# Patient Record
Sex: Female | Born: 1955 | Race: Black or African American | Hispanic: No | Marital: Single | Smoking: Former smoker
Health system: Southern US, Community
[De-identification: ages and names within clinical notes are randomized; demographics above are authoritative.]

## PROBLEM LIST (undated history)

## (undated) DIAGNOSIS — K2970 Gastritis, unspecified, without bleeding: Secondary | ICD-10-CM

## (undated) DIAGNOSIS — E785 Hyperlipidemia, unspecified: Secondary | ICD-10-CM

## (undated) DIAGNOSIS — R05 Cough: Secondary | ICD-10-CM

## (undated) DIAGNOSIS — F32A Depression, unspecified: Secondary | ICD-10-CM

## (undated) DIAGNOSIS — I10 Essential (primary) hypertension: Secondary | ICD-10-CM

## (undated) DIAGNOSIS — B019 Varicella without complication: Secondary | ICD-10-CM

## (undated) DIAGNOSIS — R531 Weakness: Secondary | ICD-10-CM

## (undated) DIAGNOSIS — E119 Type 2 diabetes mellitus without complications: Secondary | ICD-10-CM

## (undated) DIAGNOSIS — F329 Major depressive disorder, single episode, unspecified: Secondary | ICD-10-CM

## (undated) DIAGNOSIS — B9681 Helicobacter pylori [H. pylori] as the cause of diseases classified elsewhere: Secondary | ICD-10-CM

## (undated) DIAGNOSIS — Z5189 Encounter for other specified aftercare: Secondary | ICD-10-CM

## (undated) DIAGNOSIS — Z9889 Other specified postprocedural states: Secondary | ICD-10-CM

## (undated) DIAGNOSIS — R059 Cough, unspecified: Secondary | ICD-10-CM

## (undated) DIAGNOSIS — I639 Cerebral infarction, unspecified: Secondary | ICD-10-CM

## (undated) HISTORY — DX: Cough: R05

## (undated) HISTORY — DX: Helicobacter pylori (H. pylori) as the cause of diseases classified elsewhere: B96.81

## (undated) HISTORY — DX: Gastritis, unspecified, without bleeding: K29.70

## (undated) HISTORY — PX: FEMUR SURGERY: SHX943

## (undated) HISTORY — DX: Major depressive disorder, single episode, unspecified: F32.9

## (undated) HISTORY — DX: Varicella without complication: B01.9

## (undated) HISTORY — DX: Cerebral infarction, unspecified: I63.9

## (undated) HISTORY — DX: Essential (primary) hypertension: I10

## (undated) HISTORY — DX: Encounter for other specified aftercare: Z51.89

## (undated) HISTORY — PX: UTERINE FIBROID SURGERY: SHX826

## (undated) HISTORY — PX: MOUTH SURGERY: SHX715

## (undated) HISTORY — DX: Depression, unspecified: F32.A

## (undated) HISTORY — DX: Weakness: R53.1

## (undated) HISTORY — DX: Cough, unspecified: R05.9

## (undated) HISTORY — DX: Type 2 diabetes mellitus without complications: E11.9

## (undated) HISTORY — DX: Hyperlipidemia, unspecified: E78.5

---

## 2015-03-22 DIAGNOSIS — I6350 Cerebral infarction due to unspecified occlusion or stenosis of unspecified cerebral artery: Secondary | ICD-10-CM | POA: Insufficient documentation

## 2015-04-03 DIAGNOSIS — R7303 Prediabetes: Secondary | ICD-10-CM | POA: Insufficient documentation

## 2015-04-21 DIAGNOSIS — R1900 Intra-abdominal and pelvic swelling, mass and lump, unspecified site: Secondary | ICD-10-CM | POA: Insufficient documentation

## 2015-04-21 DIAGNOSIS — I10 Essential (primary) hypertension: Secondary | ICD-10-CM | POA: Insufficient documentation

## 2015-09-09 HISTORY — PX: LOOP RECORDER IMPLANT: SHX5954

## 2015-09-09 LAB — CBC AND DIFFERENTIAL
HEMATOCRIT: 42 % (ref 36–46)
Hemoglobin: 14.1 g/dL (ref 12.0–16.0)
PLATELETS: 154 10*3/uL (ref 150–399)
WBC: 7.5 10*3/mL

## 2015-09-09 LAB — BASIC METABOLIC PANEL
CREATININE: 0.8 mg/dL (ref 0.5–1.1)
Glucose: 85 mg/dL
POTASSIUM: 4.2 mmol/L (ref 3.4–5.3)
Potassium: 4.2 mmol/L (ref 3.4–5.3)
Sodium: 141 mmol/L (ref 137–147)

## 2016-02-04 ENCOUNTER — Ambulatory Visit (INDEPENDENT_AMBULATORY_CARE_PROVIDER_SITE_OTHER): Payer: PRIVATE HEALTH INSURANCE | Admitting: Family Medicine

## 2016-02-04 ENCOUNTER — Encounter: Payer: Self-pay | Admitting: Family Medicine

## 2016-02-04 VITALS — BP 116/74 | HR 77 | Temp 97.8°F | Ht 64.0 in | Wt 154.2 lb

## 2016-02-04 DIAGNOSIS — E785 Hyperlipidemia, unspecified: Secondary | ICD-10-CM

## 2016-02-04 DIAGNOSIS — Z8673 Personal history of transient ischemic attack (TIA), and cerebral infarction without residual deficits: Secondary | ICD-10-CM

## 2016-02-04 DIAGNOSIS — R569 Unspecified convulsions: Secondary | ICD-10-CM

## 2016-02-04 DIAGNOSIS — R531 Weakness: Secondary | ICD-10-CM

## 2016-02-04 DIAGNOSIS — Z1329 Encounter for screening for other suspected endocrine disorder: Secondary | ICD-10-CM

## 2016-02-04 DIAGNOSIS — I10 Essential (primary) hypertension: Secondary | ICD-10-CM

## 2016-02-04 DIAGNOSIS — Z130 Encounter for screening for diseases of the blood and blood-forming organs and certain disorders involving the immune mechanism: Secondary | ICD-10-CM

## 2016-02-04 DIAGNOSIS — E119 Type 2 diabetes mellitus without complications: Secondary | ICD-10-CM | POA: Diagnosis not present

## 2016-02-04 DIAGNOSIS — Z5181 Encounter for therapeutic drug level monitoring: Secondary | ICD-10-CM

## 2016-02-04 DIAGNOSIS — G8112 Spastic hemiplegia affecting left dominant side: Secondary | ICD-10-CM | POA: Insufficient documentation

## 2016-02-04 LAB — LIPID PANEL
Cholesterol: 153 mg/dL (ref 0–200)
HDL: 69.9 mg/dL (ref >39.00)
LDL Cholesterol: 72 mg/dL (ref 0–99)
NONHDL: 83.3
TRIGLYCERIDES: 58 mg/dL (ref 0.0–149.0)
Total CHOL/HDL Ratio: 2
VLDL: 11.6 mg/dL (ref 0.0–40.0)

## 2016-02-04 LAB — COMPREHENSIVE METABOLIC PANEL
ALK PHOS: 130 U/L — AB (ref 39–117)
ALT: 53 U/L — AB (ref 0–35)
AST: 39 U/L — ABNORMAL HIGH (ref 0–37)
Albumin: 4.2 g/dL (ref 3.5–5.2)
BILIRUBIN TOTAL: 0.3 mg/dL (ref 0.2–1.2)
BUN: 12 mg/dL (ref 6–23)
CALCIUM: 9.3 mg/dL (ref 8.4–10.5)
CO2: 30 meq/L (ref 19–32)
Chloride: 103 mEq/L (ref 96–112)
Creatinine, Ser: 0.81 mg/dL (ref 0.40–1.20)
GFR: 91.99 mL/min (ref >60.00)
GLUCOSE: 97 mg/dL (ref 70–99)
POTASSIUM: 3.9 meq/L (ref 3.5–5.1)
Sodium: 139 mEq/L (ref 135–145)
TOTAL PROTEIN: 7.2 g/dL (ref 6.0–8.3)

## 2016-02-04 LAB — CBC
HEMATOCRIT: 38.4 % (ref 36.0–46.0)
Hemoglobin: 13.1 g/dL (ref 12.0–15.0)
MCHC: 34 g/dL (ref 30.0–36.0)
MCV: 79.6 fl (ref 78.0–100.0)
PLATELETS: 294 10*3/uL (ref 150.0–400.0)
RBC: 4.82 Mil/uL (ref 3.87–5.11)
RDW: 13.2 % (ref 11.5–15.5)
WBC: 8.6 10*3/uL (ref 4.0–10.5)

## 2016-02-04 LAB — TSH: TSH: 0.71 u[IU]/mL (ref 0.35–4.50)

## 2016-02-04 LAB — HEMOGLOBIN A1C: Hgb A1c MFr Bld: 5.8 % (ref 4.6–6.5)

## 2016-02-04 NOTE — Patient Instructions (Signed)
It was very nice to meet you today- I am going to work on arranging for home PT and OT, and hopefully a home aid for you as well.  We need to work on your left hand to prevent contractures and also help you learn to use your right hand for writing, etc We will get labs today to check on your diabetes  Please come and see me in 6-8 weeks so we can check on your progress.  I will also get you referred to a neurologist locally We will be in touch with your labs asap

## 2016-02-04 NOTE — Progress Notes (Signed)
Pre visit review using our clinic review tool, if applicable. No additional management support is needed unless otherwise documented below in the visit note. 

## 2016-02-04 NOTE — Progress Notes (Signed)
Wendy [name] at [facility] [address], [place], [place] [address] [phone]  Date:  02/04/2016   Name:  Wendy [name]   DOB:  31-Dec-1955   MRN:  [id number]  PCP:  Lamar Blinks, MD    Chief Complaint: Establish Care (Pt here to est care. Flu vaccine done 01/15/2016. )   History of Present Illness:  Wendy [name] is a 61 y.o. very pleasant female patient who presents with the following:  Here today as a new patient- no notes in Epic.  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  This has caused her some residual left sided weakness- she is left hand dominate so this has really effected her.   Per her report, the CVA was thought to be possibly due to a fib which triggered a blood clot. She had a loop recorder but states that it was never really determined if she had a fib or not. Otherwise they were not able to determine a cause of her a fib  She notes that she has weakness in her left arm which causes it to be hard for her to write and to do other tasks.   She tries to use her right hand but this is difficult   Prior to her stroke she was in pretty good health.    She does not know her current medications- we are calling her pharmacy for her  She has had a cough for "well over a year."  Seemed to start with bronchitis but has persisted.   No fever She is a former smoker and quit in 2013 She is living with her cousin right now States that she is able to drive but does not drive at this time.  She is able to bathe and dress but needs help making meals and doing more complex tasks.  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   She misses working and wishes she could work again She has applied for disability- she has already been  approved for social security disability per her report.    However she does not have medicare?   Also history of hyperlipidemia She is on keppra to prevent seizures since her CVA Also on HTN medication She has been feeling depressed since she had the CVA.  Discussed meds for same- she does not really want to be on meds right now.  She feels like she has good days and bad days.  Denies any SI.  She will let me know if her mood sx get worse  She did PT until her coverage ran out. She would like to restart PT and OT as she notes a contracture forming in her left hand   There are no active problems to display for this patient.   Past Medical History:  Diagnosis Date  . Hypertension   . Stroke [facility])     No past surgical history on file.  Social History  Substance Use Topics  . Smoking status: Former Smoker    Types: Cigarettes    Quit date: 01/12/2011  . Smokeless tobacco: Never Used  . Alcohol use No    No family history on file.  Allergies  Allergen Reactions  . Lisinopril Cough  .  Vicodin [Hydrocodone-Acetaminophen] Palpitations    Medication list has been reviewed and updated.  No current outpatient prescriptions on file prior to visit.   No current facility-administered medications on file prior to visit.     Review of Systems:  As per HPI- otherwise negative.   Physical Examination: Vitals:   02/04/16 0958  BP: 116/74  Pulse: 77  Temp: 97.8 F (36.6 C)   Vitals:   02/04/16 0958  Weight: 154 lb 3.2 oz (69.9 kg)  Height: 5\' 4"  (1.626 m)   Body mass index is 26.47 kg/m. Ideal Body Weight: Weight in (lb) to have BMI = 25: 145.3  GEN: WDWN, NAD, Non-toxic, A & O x 3, mild overweight, looks well, quiet affect HEENT: Atraumatic, Normocephalic. Neck supple. No masses, No LAD. Ears and Nose: No external deformity. CV: RRR, No M/G/R. No JVD. No thrill. No extra heart sounds. PULM: CTA B, no wheezes, crackles, rhonchi. No retractions. No resp. distress. No  accessory muscle use. EXTR: No c/c/e NEURO Normal gait.   She does carry a cane but does not seem to use it.   PSYCH: Normally interactive. Conversant. Not depressed or anxious appearing.  Calm demeanor.  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  Weak grip in the left hand, mild weakness of biceps and deltoid.    Assessment and Plan: History of CVA (cerebrovascular accident) - Plan: Ambulatory referral to [place], Ambulatory referral to Neurology  Controlled type 2 diabetes mellitus without complication, without long-term current use of insulin ([place]) - Plan: Comprehensive metabolic panel, Hemoglobin A1c  Hyperlipidemia, unspecified hyperlipidemia type - Plan: Lipid panel  Essential hypertension  Left-sided weakness - Plan: Ambulatory referral to [place], Ambulatory referral to Neurology  Seizures ([place]) - Plan: Ambulatory referral to [facility], Ambulatory referral to Neurology  Screening for thyroid disorder - Plan: TSH  Screening for deficiency anemia - Plan: CBC  Medication monitoring encounter - Plan: CBC, Comprehensive metabolic panel   Here today as a new patient.  History of DM, hyperlipidemia and HTN unfortunately she suffered a CVA last year and has resultant left sided weakness.  She is disabled due to this illness.  She would benefit from PT and OT to help prevent worsening and hopefully improve use of her left hand- will make this referral. Will also get her referred to a neurologist locally   It was very nice to meet you today- I am going to work on arranging for home PT and OT, and hopefully a home aid for you as well.  We need to work on your left hand to prevent contractures and also help you learn to use your right hand for writing, etc We will get labs today to check on your diabetes  Please come and see me in 6-8 weeks so we can check on your progress.  I will also  get you referred to a neurologist locally We will be in touch with your labs asap  Signed Lamar Blinks, MD

## 2016-02-06 ENCOUNTER — Encounter: Payer: Self-pay | Admitting: Family Medicine

## 2016-02-17 ENCOUNTER — Telehealth: Payer: Self-pay | Admitting: Family Medicine

## 2016-02-17 NOTE — Telephone Encounter (Signed)
Received her records from [facility] system As of July 2017  AST 28 ALT21 Alk phos 82 Total bili 0.4

## 2016-02-23 ENCOUNTER — Encounter: Payer: Self-pay | Admitting: Family Medicine

## 2016-03-04 ENCOUNTER — Ambulatory Visit: Payer: Self-pay | Admitting: Neurology

## 2016-03-04 ENCOUNTER — Telehealth: Payer: Self-pay | Admitting: Neurology

## 2016-03-04 NOTE — Telephone Encounter (Signed)
This patient no showed for a new patient appointment today.

## 2016-03-23 ENCOUNTER — Ambulatory Visit: Payer: Self-pay | Admitting: Neurology

## 2016-03-23 ENCOUNTER — Telehealth: Payer: Self-pay | Admitting: Neurology

## 2016-03-23 NOTE — Telephone Encounter (Signed)
This patient did not show for a new patient appointment today, this is the second no-show.  The patient will be discharged from practice.

## 2016-03-24 ENCOUNTER — Ambulatory Visit: Payer: PRIVATE HEALTH INSURANCE | Admitting: Family Medicine

## 2016-03-24 ENCOUNTER — Encounter: Payer: Self-pay | Admitting: Neurology

## 2016-04-12 ENCOUNTER — Telehealth: Payer: Self-pay | Admitting: Family Medicine

## 2016-04-12 MED ORDER — AMLODIPINE BESYLATE 10 MG PO TABS: 10.0000 mg | ORAL_TABLET | Freq: Every day | ORAL | 3 refills | Status: DC

## 2016-04-12 MED ORDER — LEVETIRACETAM 1000 MG PO TABS: 1000.0000 mg | ORAL_TABLET | Freq: Two times a day (BID) | ORAL | 3 refills | Status: DC

## 2016-04-12 NOTE — Addendum Note (Signed)
Addended by: Lamar Blinks C on: 04/12/2016 04:50 PM   Modules accepted: Orders

## 2016-04-12 NOTE — Telephone Encounter (Signed)
Called her and LMOM- I refilled keppra and amlodipine. I would suspect the lisinopril is what is causing her cough. Her BP was well controlled at last visit- if she likes she can hold the lisionpril for a couple of weeks and see if the cough goes away.  Please see me in the next couple of months to check on how she is doing

## 2016-04-12 NOTE — Telephone Encounter (Signed)
Pt states she is out of Kepra and Amlodipine. Pt states Amlodipine creates a dry cough and it is aggravating. Pt would like to try a different BP med. Pt ph# [phone]. Pt pharmacy [facility] in [place]

## 2016-04-12 NOTE — Telephone Encounter (Addendum)
Patient checking on the status of BP medication, patient states its okay if generic due to the cost, patient is completley out.

## 2016-04-13 MED ORDER — LOSARTAN POTASSIUM 50 MG PO TABS: 50.0000 mg | ORAL_TABLET | Freq: Every day | ORAL | 5 refills | Status: DC

## 2016-04-13 MED ORDER — AMLODIPINE BESYLATE 10 MG PO TABS: 10.0000 mg | ORAL_TABLET | Freq: Every day | ORAL | 3 refills | Status: DC

## 2016-04-13 MED ORDER — LEVETIRACETAM 1000 MG PO TABS: 1000.0000 mg | ORAL_TABLET | Freq: Two times a day (BID) | ORAL | 3 refills | Status: DC

## 2016-04-13 NOTE — Telephone Encounter (Addendum)
Medications resent to [facility] pharmacy as requested.  Called to cancel previous rx at [facility], pharmacist stated she had already transferred the prescription to [facility].  Called [facility] and canceled duplicate rx.

## 2016-04-13 NOTE — Telephone Encounter (Signed)
Unable to afford meds, patient does not have insurance card to show pharmacy and had spoken to insurance and are unable to provide insurance ID until all paper work is received. Request to speak with someone, call back # [phone]

## 2016-04-13 NOTE — Telephone Encounter (Signed)
Patient indicated please send Rx to  [facility], [place], [place] [address] [phone], patient would like [facility], patient currently at pharmacy.

## 2016-04-13 NOTE — Addendum Note (Signed)
Addended by: Lamar Blinks C on: 04/13/2016 08:08 PM   Modules accepted: Orders

## 2016-04-13 NOTE — Telephone Encounter (Addendum)
Relation to pt: self  Call back number: [phone]  Pharmacy: [facility], [place] [phone] (Phone) [phone] (Fax)     Reason for call:  Patient states amLODipine (NORVASC) 10 MG tablet causing her to cough at night, patient would like to speak with the MD directly, informed patient PCP is out of the office and will follow up. Informed patient to keep her phone near, patient voice understanding

## 2016-04-13 NOTE — Telephone Encounter (Signed)
Called pt back- she is no longer using lisinopril, I removed this from her med list. She notes that she has been on amlodipine for a year, and has noted a cough for a year.  We can certainly have her stop this med and try something different- will try losartan for her She will continue her HCTZ Allergy listed to lisinopril - cough only rx for losartan 50 mg.  She will let me know how this works for her She needs an OV- will ask staff to schedule her

## 2016-04-13 NOTE — Addendum Note (Signed)
Addended by: Dorrene German on: 04/13/2016 03:18 PM   Modules accepted: Orders

## 2016-04-14 NOTE — Telephone Encounter (Signed)
Pt schedule for 5/7 for BP f/u visit.

## 2016-04-30 ENCOUNTER — Encounter (HOSPITAL_COMMUNITY): Payer: Self-pay | Admitting: Oncology

## 2016-04-30 ENCOUNTER — Emergency Department (HOSPITAL_COMMUNITY)
Admission: EM | Admit: 2016-04-30 | Discharge: 2016-04-30 | Disposition: A | Discharge status: Home / Self Care | Payer: No Typology Code available for payment source | Attending: Emergency Medicine | Admitting: Emergency Medicine

## 2016-04-30 ENCOUNTER — Emergency Department (HOSPITAL_COMMUNITY): Payer: No Typology Code available for payment source

## 2016-04-30 DIAGNOSIS — Y999 Unspecified external cause status: Secondary | ICD-10-CM | POA: Insufficient documentation

## 2016-04-30 DIAGNOSIS — Y92410 Unspecified street and highway as the place of occurrence of the external cause: Secondary | ICD-10-CM | POA: Diagnosis not present

## 2016-04-30 DIAGNOSIS — Y939 Activity, unspecified: Secondary | ICD-10-CM | POA: Diagnosis not present

## 2016-04-30 DIAGNOSIS — E119 Type 2 diabetes mellitus without complications: Secondary | ICD-10-CM | POA: Insufficient documentation

## 2016-04-30 DIAGNOSIS — S8991XA Unspecified injury of right lower leg, initial encounter: Secondary | ICD-10-CM | POA: Diagnosis present

## 2016-04-30 DIAGNOSIS — M25561 Pain in right knee: Secondary | ICD-10-CM | POA: Diagnosis not present

## 2016-04-30 DIAGNOSIS — Z8673 Personal history of transient ischemic attack (TIA), and cerebral infarction without residual deficits: Secondary | ICD-10-CM | POA: Insufficient documentation

## 2016-04-30 DIAGNOSIS — I10 Essential (primary) hypertension: Secondary | ICD-10-CM | POA: Insufficient documentation

## 2016-04-30 DIAGNOSIS — Z87891 Personal history of nicotine dependence: Secondary | ICD-10-CM | POA: Diagnosis not present
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
## 2016-04-30 NOTE — Discharge Instructions (Signed)
Return if any problems.
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
## 2016-05-03 ENCOUNTER — Ambulatory Visit (INDEPENDENT_AMBULATORY_CARE_PROVIDER_SITE_OTHER): Payer: Self-pay | Admitting: Family Medicine

## 2016-05-03 ENCOUNTER — Encounter: Payer: Self-pay | Admitting: Family Medicine

## 2016-05-03 ENCOUNTER — Ambulatory Visit (HOSPITAL_BASED_OUTPATIENT_CLINIC_OR_DEPARTMENT_OTHER)
Admission: RE | Admit: 2016-05-03 | Discharge: 2016-05-03 | Disposition: A | Discharge status: Home / Self Care | Payer: BLUE CROSS/BLUE SHIELD | Source: Ambulatory Visit | Attending: Family Medicine | Admitting: Family Medicine

## 2016-05-03 VITALS — BP 132/84 | HR 91 | Temp 98.5°F | Ht 64.0 in | Wt 165.8 lb

## 2016-05-03 DIAGNOSIS — Z1211 Encounter for screening for malignant neoplasm of colon: Secondary | ICD-10-CM

## 2016-05-03 DIAGNOSIS — Z8673 Personal history of transient ischemic attack (TIA), and cerebral infarction without residual deficits: Secondary | ICD-10-CM

## 2016-05-03 DIAGNOSIS — Z1231 Encounter for screening mammogram for malignant neoplasm of breast: Secondary | ICD-10-CM

## 2016-05-03 DIAGNOSIS — R053 Chronic cough: Secondary | ICD-10-CM

## 2016-05-03 DIAGNOSIS — I10 Essential (primary) hypertension: Secondary | ICD-10-CM

## 2016-05-03 DIAGNOSIS — Z1239 Encounter for other screening for malignant neoplasm of breast: Secondary | ICD-10-CM

## 2016-05-03 DIAGNOSIS — E119 Type 2 diabetes mellitus without complications: Secondary | ICD-10-CM

## 2016-05-03 DIAGNOSIS — Z1159 Encounter for screening for other viral diseases: Secondary | ICD-10-CM

## 2016-05-03 DIAGNOSIS — R945 Abnormal results of liver function studies: Secondary | ICD-10-CM

## 2016-05-03 DIAGNOSIS — R05 Cough: Secondary | ICD-10-CM

## 2016-05-03 DIAGNOSIS — K7689 Other specified diseases of liver: Secondary | ICD-10-CM

## 2016-05-03 LAB — HEPATIC FUNCTION PANEL
ALBUMIN: 4.6 g/dL (ref 3.5–5.2)
ALT: 19 U/L (ref 0–35)
AST: 20 U/L (ref 0–37)
Alkaline Phosphatase: 129 U/L — ABNORMAL HIGH (ref 39–117)
BILIRUBIN TOTAL: 0.4 mg/dL (ref 0.2–1.2)
Bilirubin, Direct: 0.1 mg/dL (ref 0.0–0.3)
Total Protein: 7.5 g/dL (ref 6.0–8.3)

## 2016-05-03 LAB — HEMOGLOBIN A1C: HEMOGLOBIN A1C: 6.4 % (ref 4.6–6.5)

## 2016-05-03 MED ORDER — BENZONATATE 100 MG PO CAPS: 100.0000 mg | ORAL_CAPSULE | Freq: Three times a day (TID) | ORAL | 1 refills | Status: DC | PRN

## 2016-05-03 MED ORDER — IPRATROPIUM BROMIDE 0.03 % NA SOLN: NASAL | 6 refills | Status: DC

## 2016-05-03 NOTE — Patient Instructions (Signed)
It was very nice to see you again today! Please go to the lab, and then to the ground floor to have a chest x-ray.  Then you can head home We are going to set you up for mammogram, cardiology, and gastroenterology (for colonoscopy) Please see the eye care professional of your choice for a diabetic eye exam soon  For your cough, we will make sure that your chest x-ray looks fine. Also, please try the tessalon as needed for cough, and continue taking your zyrtec.  Also, add the atrovent nasal spray as needed- this should help dry up any mucus going down your throat and triggering a cough  I'll be in touch with your labs and we can plan our next visit

## 2016-05-04 ENCOUNTER — Encounter: Payer: Self-pay | Admitting: Family Medicine

## 2016-05-04 LAB — HEPATITIS C ANTIBODY: HCV AB: NEGATIVE

## 2016-05-07 ENCOUNTER — Ambulatory Visit (HOSPITAL_BASED_OUTPATIENT_CLINIC_OR_DEPARTMENT_OTHER): Payer: Self-pay

## 2016-05-10 ENCOUNTER — Ambulatory Visit (HOSPITAL_BASED_OUTPATIENT_CLINIC_OR_DEPARTMENT_OTHER)
Admission: RE | Admit: 2016-05-10 | Discharge: 2016-05-10 | Disposition: A | Discharge status: Home / Self Care | Payer: BLUE CROSS/BLUE SHIELD | Source: Ambulatory Visit | Attending: Family Medicine | Admitting: Family Medicine

## 2016-05-10 ENCOUNTER — Encounter (HOSPITAL_BASED_OUTPATIENT_CLINIC_OR_DEPARTMENT_OTHER): Payer: Self-pay

## 2016-05-10 DIAGNOSIS — Z1231 Encounter for screening mammogram for malignant neoplasm of breast: Secondary | ICD-10-CM | POA: Diagnosis present

## 2016-05-10 DIAGNOSIS — Z1239 Encounter for other screening for malignant neoplasm of breast: Secondary | ICD-10-CM

## 2016-05-17 ENCOUNTER — Telehealth: Payer: Self-pay | Admitting: Family Medicine

## 2016-05-17 ENCOUNTER — Ambulatory Visit: Payer: PRIVATE HEALTH INSURANCE | Admitting: Family Medicine

## 2016-05-17 NOTE — Telephone Encounter (Signed)
Pt will not be here today for 11:15am appt, she has to rely on other transportation and they are not able to get her here. She is rescheduled for 05/19/16.
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
## 2016-05-19 ENCOUNTER — Encounter: Payer: Self-pay | Admitting: Cardiology

## 2016-05-19 ENCOUNTER — Ambulatory Visit: Payer: Medicaid Other | Admitting: Family Medicine

## 2016-05-19 ENCOUNTER — Telehealth: Payer: Self-pay | Admitting: Family Medicine

## 2016-05-19 NOTE — Telephone Encounter (Signed)
No, that is ok.

## 2016-05-19 NOTE — Telephone Encounter (Signed)
Pt called in at 10:00 to reschedule her appt. Pt says that she dont have transportation to her visit.   Rescheduled pt to tomorrow 05/20/16 at 1:00p   Should pt be charged?

## 2016-05-20 ENCOUNTER — Ambulatory Visit (INDEPENDENT_AMBULATORY_CARE_PROVIDER_SITE_OTHER): Payer: BLUE CROSS/BLUE SHIELD | Admitting: Family Medicine

## 2016-05-20 ENCOUNTER — Telehealth: Payer: Self-pay | Admitting: Family Medicine

## 2016-05-20 VITALS — BP 134/80 | HR 79 | Temp 98.3°F | Ht 64.0 in | Wt 169.2 lb

## 2016-05-20 DIAGNOSIS — Z8673 Personal history of transient ischemic attack (TIA), and cerebral infarction without residual deficits: Secondary | ICD-10-CM | POA: Diagnosis not present

## 2016-05-20 DIAGNOSIS — E119 Type 2 diabetes mellitus without complications: Secondary | ICD-10-CM | POA: Diagnosis not present

## 2016-05-20 DIAGNOSIS — Z23 Encounter for immunization: Secondary | ICD-10-CM

## 2016-05-20 DIAGNOSIS — I10 Essential (primary) hypertension: Secondary | ICD-10-CM | POA: Diagnosis not present

## 2016-05-20 DIAGNOSIS — R531 Weakness: Secondary | ICD-10-CM

## 2016-05-20 NOTE — Patient Instructions (Addendum)
It was good to see you today- take care and let me know if you need anything I do think it would be smart to have a nail shop cut back your finger and toenails- however you can then maintain them at home Please do add back your zyrtec for your cough  Please see me in about 4 months for a recheck- we can do your physical and pap at that visit!

## 2016-05-20 NOTE — Telephone Encounter (Signed)
Caller name: Relationship to patient: Self Can be reached: [phone]  Pharmacy:  Reason for call: Patient request a referral for [facility]
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
## 2016-05-21 NOTE — Addendum Note (Signed)
Addended by: Lamar Blinks C on: 05/21/2016 03:41 PM   Modules accepted: Orders

## 2016-05-21 NOTE — Telephone Encounter (Signed)
done

## 2016-05-31 ENCOUNTER — Encounter (HOSPITAL_BASED_OUTPATIENT_CLINIC_OR_DEPARTMENT_OTHER): Payer: Self-pay | Admitting: Emergency Medicine

## 2016-05-31 ENCOUNTER — Emergency Department (HOSPITAL_BASED_OUTPATIENT_CLINIC_OR_DEPARTMENT_OTHER)
Admission: EM | Admit: 2016-05-31 | Discharge: 2016-05-31 | Disposition: A | Discharge status: Home / Self Care | Payer: BLUE CROSS/BLUE SHIELD | Attending: Emergency Medicine | Admitting: Emergency Medicine

## 2016-05-31 ENCOUNTER — Emergency Department (HOSPITAL_BASED_OUTPATIENT_CLINIC_OR_DEPARTMENT_OTHER): Payer: BLUE CROSS/BLUE SHIELD

## 2016-05-31 DIAGNOSIS — Z87891 Personal history of nicotine dependence: Secondary | ICD-10-CM | POA: Diagnosis not present

## 2016-05-31 DIAGNOSIS — E119 Type 2 diabetes mellitus without complications: Secondary | ICD-10-CM | POA: Insufficient documentation

## 2016-05-31 DIAGNOSIS — M7989 Other specified soft tissue disorders: Secondary | ICD-10-CM | POA: Diagnosis present

## 2016-05-31 DIAGNOSIS — I10 Essential (primary) hypertension: Secondary | ICD-10-CM | POA: Insufficient documentation

## 2016-05-31 DIAGNOSIS — Z79899 Other long term (current) drug therapy: Secondary | ICD-10-CM | POA: Insufficient documentation

## 2016-05-31 DIAGNOSIS — Y9389 Activity, other specified: Secondary | ICD-10-CM | POA: Insufficient documentation

## 2016-05-31 DIAGNOSIS — M7022 Olecranon bursitis, left elbow: Secondary | ICD-10-CM | POA: Diagnosis not present

## 2016-05-31 MED ORDER — IBUPROFEN 400 MG PO TABS: 400.0000 mg | ORAL_TABLET | Freq: Four times a day (QID) | ORAL | 0 refills | Status: DC | PRN

## 2016-05-31 MED FILL — IBUPROFEN 400 MG TABLET: 400 | 7 days supply | Qty: 30 | Fill #0

## 2016-05-31 NOTE — ED Notes (Signed)
ED Provider at bedside. 

## 2016-05-31 NOTE — Discharge Instructions (Signed)
Take the ibuprofen as needed for pain and discomfort, follow-up with Dr. Barbaraann Barthel, sports medicine for further evaluation
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
## 2016-05-31 NOTE — ED Triage Notes (Signed)
Left elbow swelling, denies injury, for 2-3 weeks

## 2016-06-09 ENCOUNTER — Ambulatory Visit: Payer: Self-pay | Admitting: Cardiology

## 2016-06-10 ENCOUNTER — Encounter: Payer: Self-pay | Admitting: Cardiology

## 2016-06-16 ENCOUNTER — Telehealth: Payer: Self-pay | Admitting: Family Medicine

## 2016-06-16 DIAGNOSIS — R531 Weakness: Secondary | ICD-10-CM

## 2016-06-16 DIAGNOSIS — M24542 Contracture, left hand: Secondary | ICD-10-CM

## 2016-06-16 NOTE — Telephone Encounter (Signed)
Caller name: Relationship to patient: Self Can be reached: [phone]  Pharmacy:  Reason for call: Patient request a referral for PT

## 2016-06-17 ENCOUNTER — Telehealth: Payer: Self-pay | Admitting: Family Medicine

## 2016-06-17 ENCOUNTER — Encounter: Payer: Self-pay | Admitting: Family Medicine

## 2016-06-17 NOTE — Telephone Encounter (Signed)
Pt came in office also wanting to know if she can get a home healthcare service also. Please advise.

## 2016-06-17 NOTE — Telephone Encounter (Signed)
Pt dropped off documents to be filled out ([place]- in a large white envelope) by provider. Document when ready please call pt to pick up at [phone]. Document put at front office tray.

## 2016-06-18 NOTE — Addendum Note (Signed)
Addended by: Lamar Blinks on: 06/18/2016 12:52 PM   Modules accepted: Orders

## 2016-06-23 ENCOUNTER — Telehealth: Payer: Self-pay | Admitting: Family Medicine

## 2016-06-23 NOTE — Telephone Encounter (Addendum)
Document placed on my desk sometime Monday afternoon, 06/21/16, after I had left the office; received on Tues, 06/22/16 Am; will attempt to find previous notes, as this paperwork is blank without any details. Patient will need to complete her form [Continuance of Long Term Disability] with medical information as stated in instructions, and also; her Medical Release section before we can release information to [employer]; forwarded to provider with office notes and post-it notes regarding issues with paperwork attached/SLS 06/13

## 2016-06-23 NOTE — Telephone Encounter (Signed)
Caller name: Jenny Reichmann  Relation to pt: Midwife from [employer] back number: [phone]    Reason for call:  Unable to initiate care wanted to confirm patient homebound status, please advise

## 2016-06-23 NOTE — Telephone Encounter (Signed)
Received these forms- they are quite complex.  Will need an office visit to complete.  Will ask Norvel Richards to call her and schedule a visit
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
## 2016-06-24 NOTE — Telephone Encounter (Signed)
Pt scheduled for 06/28/16 to have forms filled out
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
## 2016-06-28 ENCOUNTER — Other Ambulatory Visit: Payer: Self-pay

## 2016-06-28 ENCOUNTER — Encounter: Payer: Self-pay | Admitting: Family Medicine

## 2016-06-28 ENCOUNTER — Ambulatory Visit (INDEPENDENT_AMBULATORY_CARE_PROVIDER_SITE_OTHER): Payer: BLUE CROSS/BLUE SHIELD | Admitting: Family Medicine

## 2016-06-28 VITALS — BP 131/73 | HR 101 | Temp 98.7°F | Ht 64.0 in | Wt 169.6 lb

## 2016-06-28 DIAGNOSIS — R531 Weakness: Secondary | ICD-10-CM | POA: Diagnosis not present

## 2016-06-28 DIAGNOSIS — R053 Chronic cough: Secondary | ICD-10-CM

## 2016-06-28 DIAGNOSIS — R05 Cough: Secondary | ICD-10-CM

## 2016-06-28 DIAGNOSIS — Z8673 Personal history of transient ischemic attack (TIA), and cerebral infarction without residual deficits: Secondary | ICD-10-CM

## 2016-06-28 DIAGNOSIS — E119 Type 2 diabetes mellitus without complications: Secondary | ICD-10-CM | POA: Diagnosis not present

## 2016-06-28 DIAGNOSIS — M24542 Contracture, left hand: Secondary | ICD-10-CM

## 2016-06-28 DIAGNOSIS — R569 Unspecified convulsions: Secondary | ICD-10-CM

## 2016-06-28 MED ORDER — BENZONATATE 100 MG PO CAPS: 100.0000 mg | ORAL_CAPSULE | Freq: Three times a day (TID) | ORAL | 1 refills | Status: DC | PRN

## 2016-06-28 NOTE — Patient Instructions (Addendum)
It was good to see you again today- take care and let me know if I can be of any further service to you as far as your disability benefits.   You may want to consult with a lawyer who specializes in social security/ disability law to make sure you are getting all possible benefits.

## 2016-06-29 ENCOUNTER — Encounter: Payer: Self-pay | Admitting: Family Medicine

## 2016-06-29 NOTE — Telephone Encounter (Addendum)
error:[id number] ° °

## 2016-06-29 NOTE — Telephone Encounter (Signed)
Caller name: Sheryle Hail from [facility]  Relation to pt: LPN   Reason for call:  Wanted to inform PCP nurse will make a home visit tomorrow

## 2016-06-30 ENCOUNTER — Telehealth: Payer: Self-pay | Admitting: Family Medicine

## 2016-06-30 DIAGNOSIS — R05 Cough: Secondary | ICD-10-CM

## 2016-06-30 DIAGNOSIS — R053 Chronic cough: Secondary | ICD-10-CM

## 2016-06-30 DIAGNOSIS — I10 Essential (primary) hypertension: Secondary | ICD-10-CM

## 2016-06-30 MED ORDER — AMLODIPINE BESYLATE 10 MG PO TABS: 10.0000 mg | ORAL_TABLET | Freq: Every day | ORAL | 3 refills | Status: DC

## 2016-06-30 MED ORDER — ROSUVASTATIN CALCIUM 20 MG PO TABS: 20.0000 mg | ORAL_TABLET | Freq: Every day | ORAL | 3 refills | Status: DC

## 2016-06-30 MED ORDER — BENZONATATE 100 MG PO CAPS: 100.0000 mg | ORAL_CAPSULE | Freq: Three times a day (TID) | ORAL | 1 refills | Status: DC | PRN

## 2016-06-30 MED ORDER — CETIRIZINE HCL 10 MG PO TABS: 10.0000 mg | ORAL_TABLET | Freq: Every day | ORAL | 3 refills | Status: DC

## 2016-06-30 MED ORDER — LEVETIRACETAM 1000 MG PO TABS: 1000.0000 mg | ORAL_TABLET | Freq: Two times a day (BID) | ORAL | 3 refills | Status: DC

## 2016-06-30 NOTE — Telephone Encounter (Signed)
Caller name:Carol Lanae Boast  Relationship to patient:[facility] Can be reached:[phone] Pharmacy:  Reason for call:Requesting verbal orders for PT, OT, Medical and Disease management.

## 2016-06-30 NOTE — Telephone Encounter (Signed)
Called and gave VO 

## 2016-06-30 NOTE — Telephone Encounter (Signed)
Called her back to go over her question about her BP med.  I have given her an rx for losartan 50 mg-  BP Readings from Last 3 Encounters:  06/28/16 131/73  05/31/16 (!) 143/82  05/20/16 134/80   However she is actually taking amlodipine 10 mg once a day.  Advised her that this is fine, we can continue with her amlodipine.  I will call in a RF of this med as well and take losartan off her list

## 2016-06-30 NOTE — Telephone Encounter (Signed)
°  Relation to PR:XYVO Call back number:[phone] Pharmacy: [facility], [place] [phone] (Phone) [phone] (Fax)     Reason for call:  Patient requesting the following medication: rosuvastatin (CRESTOR) 20 MG tablet levETIRAcetam (KEPPRA) 1000 MG tablet,  cetirizine (ZYRTEC) 10 MG tablet ,  benzonatate (TESSALON) 100 MG capsule  ibuprofen (ADVIL,MOTRIN) 400 MG tablet   Patient would like to speak with a nurse regarding what BP medication she should be taken,please advise

## 2016-07-08 ENCOUNTER — Telehealth: Payer: Self-pay | Admitting: *Deleted

## 2016-07-08 ENCOUNTER — Telehealth: Payer: Self-pay | Admitting: Family Medicine

## 2016-07-08 NOTE — Telephone Encounter (Signed)
Returned [place] with [facility] [employer]. Verbal orders given for [facility] OT, 2x week for 5 weeks.

## 2016-07-08 NOTE — Telephone Encounter (Signed)
Caller name:Brittany with [facility] Can be reached: [phone]   Reason for call: requesting VO for [facility] OT, 2x week for 5 weeks.

## 2016-07-08 NOTE — Telephone Encounter (Signed)
Received Physician Orders from [place], forwarded to provider/SLS 06/28

## 2016-07-20 ENCOUNTER — Ambulatory Visit (INDEPENDENT_AMBULATORY_CARE_PROVIDER_SITE_OTHER): Payer: BLUE CROSS/BLUE SHIELD | Admitting: Cardiology

## 2016-07-20 ENCOUNTER — Telehealth: Payer: Self-pay | Admitting: Family Medicine

## 2016-07-20 ENCOUNTER — Encounter: Payer: Self-pay | Admitting: Cardiology

## 2016-07-20 DIAGNOSIS — E7849 Other hyperlipidemia: Secondary | ICD-10-CM

## 2016-07-20 DIAGNOSIS — Z8673 Personal history of transient ischemic attack (TIA), and cerebral infarction without residual deficits: Secondary | ICD-10-CM | POA: Diagnosis not present

## 2016-07-20 DIAGNOSIS — Z9889 Other specified postprocedural states: Secondary | ICD-10-CM | POA: Diagnosis not present

## 2016-07-20 DIAGNOSIS — R0989 Other specified symptoms and signs involving the circulatory and respiratory systems: Secondary | ICD-10-CM | POA: Diagnosis not present

## 2016-07-20 DIAGNOSIS — I10 Essential (primary) hypertension: Secondary | ICD-10-CM

## 2016-07-20 DIAGNOSIS — E784 Other hyperlipidemia: Secondary | ICD-10-CM

## 2016-07-20 NOTE — Assessment & Plan Note (Signed)
Controlled.  

## 2016-07-20 NOTE — Patient Instructions (Addendum)
Medication Instructions: No changes   Procedures/Testing: Your physician has requested that you have a carotid duplex. This test is an ultrasound of the carotid arteries in your neck. It looks at blood flow through these arteries that supply the brain with blood. Allow one hour for this exam. There are no restrictions or special instructions. This will be done at [place], [address]   Follow-Up: Your physician recommends that you schedule a follow-up appointment in: 3 months with Dr. Claiborne Billings.    If you need a refill on your cardiac medications before your next appointment, please call your pharmacy.

## 2016-07-20 NOTE — Telephone Encounter (Signed)
Caller name: Vilma Prader  Relation to pt: PT from [facility]  Call back number:  [phone]    Reason for call:  Requesting verbal orders for PT 2x 3

## 2016-07-20 NOTE — Assessment & Plan Note (Signed)
On statin Rx 

## 2016-07-20 NOTE — Progress Notes (Signed)
07/20/2016 Wendy [name]   10/17/1955  [id number]  Primary Physician Copland, Gay Filler, MD Primary Cardiologist: New (will be Dr Claiborne Billings)  HPI:  61 y/o female from [place]. She used to work Therapist, art for [employer]. She had no prior documented medical issues till the Spring of 2017. 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 She was placed on medication for HTN and was discharged. She presented one week later with a RMCA infarct, out of the window for TPA. This was a significant stroke with dysphagia requiring a PEG, rehab, and residual Lt sided weakness. 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 She had this (MDT) Aug 2017. An echo done then showed an EF of 70%, no PFO or shunt. Records were reviewed from notes sent to [place].   The pt moved to [place] two months ago. She was referred to Dr Radford Pax in May but did not show for the appointment. She was seen by me today. Her daughter accompanied her. Neither the pt nor the daughter knew why she was here, the nurse happened to ask if she ever had a device placed which led [place] to the loop recorder and notes from her Neurologist in [place]. The pt denies any history of CAD or tachycardia. Her loop was interrogated and showed no episodes of AF.    Current Outpatient Prescriptions  Medication Sig Dispense Refill  . amLODipine (NORVASC) 10 MG tablet Take 1 tablet (10 mg total) by mouth daily. 90 tablet 3  . benzonatate (TESSALON) 100 MG capsule Take 1 capsule (100 mg total) by mouth 3 (three) times daily as needed for cough. 90 capsule 1  . cetirizine (ZYRTEC) 10 MG tablet Take 1 tablet (10 mg total) by mouth daily. 90 tablet 3  . ibuprofen (ADVIL,MOTRIN) 400 MG tablet Take 1 tablet (400 mg total) by mouth every 6 (six) hours as needed. 30 tablet 0  . ipratropium (ATROVENT) 0.03 % nasal spray Spray 2 sprays  in each nostril up to 4x a day as needed 30 mL 6  . levETIRAcetam (KEPPRA) 1000 MG tablet Take 1 tablet (1,000 mg total) by mouth 2 (two) times daily. 180 tablet 3  . rosuvastatin (CRESTOR) 20 MG tablet Take 1 tablet (20 mg total) by mouth daily. 90 tablet 3   No current facility-administered medications for this visit.     Allergies  Allergen Reactions  . Lisinopril Cough  . Vicodin [Hydrocodone-Acetaminophen] Palpitations    Past Medical History:  Diagnosis Date  . Chicken pox   . Hyperlipidemia   . Hypertension   . Stroke ([place])    Pt had 2 strokes  . Type 2 diabetes mellitus ([place])     Social History   Social History  . Marital status: Divorced    Spouse name: N/A  . Number of children: N/A  . Years of education: N/A   Occupational History  . Not on file.   Social History Main Topics  . Smoking status: Former Smoker    Types: Cigarettes    Quit date: 01/12/2011  . Smokeless tobacco: Never Used  . Alcohol use No  . Drug use: No  . Sexual activity: Not on file   Other Topics Concern  . Not on file   Social History Narrative  . No narrative on file     Family History  Problem Relation Age of Onset  . Pancreatic cancer Mother   . Colon cancer Father   . Hypertension Father   . Heart attack Sister        died around age 45  . Stroke Paternal Grandmother      Review of Systems: General: negative for chills, fever, night sweats or weight changes.  Cardiovascular: negative for chest pain, dyspnea on exertion, edema, orthopnea, palpitations, paroxysmal nocturnal dyspnea or shortness of breath Dermatological: negative for rash Respiratory: chronic non productive hoarse cough Urologic: negative for hematuria Abdominal: negative for nausea, vomiting, diarrhea, bright red blood per rectum, melena, or hematemesis Neurologic: negative for visual changes, syncope, or dizziness All other systems reviewed and are otherwise negative except as noted  above.    Blood pressure 122/80, pulse 94, height 5\' 4"  (1.626 m), weight 176 lb (79.8 kg).  General appearance: alert, cooperative, no distress and mildly obese Neck: no JVD and Lt CA bruit Lungs: clear to auscultation bilaterally Heart: regular rate and rhythm Abdomen: obese, non tender Extremities: no edema Pulses: 2+ and symmetric Skin: Skin color, texture, turgor normal. 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 I suggested she resume this. Her PCP has referred her to Pulmonary and Neurology.  I did order carotid dopplers. I will arrange for her to f/u with a cardiologist in 3 months.   Kerin Ransom PA-C 07/20/2016 4:27 PM

## 2016-07-20 NOTE — Assessment & Plan Note (Addendum)
Loop recorder implant Aug 2017- Dr Consuello Masse in [place] Interrogated in the office today, no atrial fibrillation recorded

## 2016-07-20 NOTE — Assessment & Plan Note (Addendum)
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

## 2016-07-20 NOTE — Assessment & Plan Note (Signed)
Unclear if this has been evaluated

## 2016-07-20 NOTE — Assessment & Plan Note (Signed)
Post CVA °

## 2016-07-21 ENCOUNTER — Telehealth: Payer: Self-pay | Admitting: Cardiovascular Disease

## 2016-07-21 NOTE — Telephone Encounter (Signed)
Verbal orders given  

## 2016-07-21 NOTE — Telephone Encounter (Signed)
Faxed Release signed by patient to [facility] to obtain records per Dr Evette Georges request.  Faxed on 07/21/16. lp

## 2016-07-23 ENCOUNTER — Other Ambulatory Visit (INDEPENDENT_AMBULATORY_CARE_PROVIDER_SITE_OTHER): Payer: BLUE CROSS/BLUE SHIELD

## 2016-07-23 ENCOUNTER — Ambulatory Visit (INDEPENDENT_AMBULATORY_CARE_PROVIDER_SITE_OTHER): Payer: BLUE CROSS/BLUE SHIELD | Admitting: Internal Medicine

## 2016-07-23 ENCOUNTER — Encounter: Payer: Self-pay | Admitting: Internal Medicine

## 2016-07-23 VITALS — BP 132/80 | HR 85 | Ht 64.0 in | Wt 177.0 lb

## 2016-07-23 DIAGNOSIS — R058 Other specified cough: Secondary | ICD-10-CM

## 2016-07-23 DIAGNOSIS — R05 Cough: Secondary | ICD-10-CM

## 2016-07-23 LAB — CBC WITH DIFFERENTIAL/PLATELET
BASOS ABS: 0 10*3/uL (ref 0.0–0.1)
Basophils Relative: 0.3 % (ref 0.0–3.0)
EOS ABS: 0.1 10*3/uL (ref 0.0–0.7)
Eosinophils Relative: 1.6 % (ref 0.0–5.0)
HCT: 42.7 % (ref 36.0–46.0)
Hemoglobin: 14.5 g/dL (ref 12.0–15.0)
LYMPHS ABS: 2.1 10*3/uL (ref 0.7–4.0)
Lymphocytes Relative: 32.5 % (ref 12.0–46.0)
MCHC: 34 g/dL (ref 30.0–36.0)
MCV: 78.7 fl (ref 78.0–100.0)
Monocytes Absolute: 0.6 10*3/uL (ref 0.1–1.0)
Monocytes Relative: 9.5 % (ref 3.0–12.0)
NEUTROS ABS: 3.6 10*3/uL (ref 1.4–7.7)
NEUTROS PCT: 56.1 % (ref 43.0–77.0)
PLATELETS: 247 10*3/uL (ref 150.0–400.0)
RBC: 5.42 Mil/uL — ABNORMAL HIGH (ref 3.87–5.11)
RDW: 13.8 % (ref 11.5–15.5)
WBC: 6.5 10*3/uL (ref 4.0–10.5)

## 2016-07-23 MED ORDER — PANTOPRAZOLE SODIUM 40 MG PO TBEC: 40.0000 mg | DELAYED_RELEASE_TABLET | Freq: Every day | ORAL | 2 refills | Status: DC

## 2016-07-23 MED ORDER — PREDNISONE 10 MG PO TABS: ORAL_TABLET | ORAL | 0 refills | Status: DC

## 2016-07-23 MED ORDER — FAMOTIDINE 20 MG PO TABS: ORAL_TABLET | ORAL | 2 refills | Status: DC

## 2016-07-23 MED ORDER — TRAMADOL HCL 50 MG PO TABS: ORAL_TABLET | ORAL | 0 refills | Status: DC

## 2016-07-23 NOTE — Patient Instructions (Addendum)
Stop zyrtec and tessalon and atovent     The key to effective treatment for your cough is eliminating the non-stop cycle of cough you're stuck in long enough to let your airway heal completely and then see if there is anything still making you cough once you stop the cough suppression, but this should take no more than 5 days to figure out  First take delsym two tsp every 12 hours and supplement if needed with  tramadol 50 mg up to 2 every 4 hours to suppress the urge to cough at all or even clear your throat. Swallowing water or using ice chips/non mint and menthol containing candies (such as lifesavers or sugarless jolly ranchers) are also effective.  You should rest your voice and avoid activities that you know make you cough.  Once you have eliminated the cough for 3 straight days try reducing the tramadol first,  then the delsym as tolerated.    Prednisone 10 mg take  4 each am x 2 days,   2 each am x 2 days,  1 each am x 2 days and stop (this is to eliminate allergies and inflammation from coughing)  Protonix (pantoprazole) Take 30-60 min before first meal of the day and Pepcid 20 mg one bedtime plus chlorpheniramine 4 mg x 2 at bedtime (both available over the counter)  until cough is completely gone for at least a week without the need for cough suppression  GERD (REFLUX)  is an extremely common cause of respiratory symptoms, many times with no significant heartburn at all.    It can be treated with medication, but also with lifestyle changes including avoidance of late meals, excessive alcohol, smoking cessation, and avoid fatty foods, chocolate, peppermint, colas, red wine, and acidic juices such as orange juice.  NO MINT OR MENTHOL PRODUCTS SO NO COUGH DROPS   USE HARD CANDY INSTEAD (jolley ranchers or Stover's or Lifesavers (all available in sugarless versions) NO OIL BASED VITAMINS - use powdered substitutes.   Please see patient coordinator before you leave today  to schedule sinus  CT   Please remember to go to the lab department downstairs in the basement  for your tests - we will call you with the results when they are available.

## 2016-07-23 NOTE — Progress Notes (Signed)
Subjective:     Patient ID: Wendy [name], female   DOB: [birth year],    MRN: [id number]  HPI  26 yobf quit smoking 2013 with resp problems prior then 2016 sudden onset of coughing fit one day  at work in customer service in [place] and coughed "every day since"   Says sometime later place on lisinopril and stopped July 2017 because the cough on ACEI was much worse and never improved so referred to pulmonary clinic 07/23/2016 by Dr   Edilia Bo    07/23/2016 1st [facility] office visit/ Tristyn Demarest   Chief Complaint  Patient presents with  . Pulmonary Consult    Referred by Dr. Silvestre Mesi.  Pt c/o cough for the past year- prod with white sputum.  She states that her cough is "all the time" with no specific trigger.    coughs so hard Pos urinary incont and vomiting Maybe a couple of tbsp total per day mucoid sputum, no better with tessalon / zyrtec, atrovent nasal spray  Has not treid any inhalers  cough present 24/7 and interferes with sleep   Not limited by breathing from desired activities    No obvious day to day or daytime variability or assoc   purulent sputum or mucus plugs or hemoptysis or cp or chest tightness, subjective wheeze or overt sinus or hb symptoms. No unusual exp hx or h/o childhood pna/ asthma or knowledge of premature birth.  Also denies any obvious fluctuation of symptoms with weather or environmental changes or other aggravating or alleviating factors except as outlined above   Current Medications, Allergies, Complete Past Medical History, Past Surgical History, Family History, and Social History were reviewed in [employer] record.  ROS  The following are not active complaints unless bolded sore throat, dysphagia, dental problems, itching, sneezing,  nasal congestion or excess/ purulent secretions, ear ache,   fever, chills, sweats, unintended wt loss, classically pleuritic or exertional cp,  orthopnea pnd or leg swelling, presyncope,  palpitations, abdominal pain, anorexia, nausea, vomiting, diarrhea  or change in bowel or bladder habits, change in stools or urine, dysuria,hematuria,  rash, arthralgias, visual complaints, headache, numbness, weakness or ataxia or problems with walking or coordination,  change in mood/affect or memory.            Review of Systems     Objective:   Physical Exam    amb obese bf  With extremely harsh upper airway cough   Wt Readings from Last 3 Encounters:  07/23/16 177 lb (80.3 kg)  07/20/16 176 lb (79.8 kg)  06/28/16 169 lb 9.6 oz (76.9 kg)    Vital signs reviewed   - Note on arrival 02 sats  100% on RA     HEENT: nl  oropharynx. Nl external ear canals without cough reflex - moderate bilateral non-specific turbinate edema  With crusting on L  - upper dentures s/p multiple tooth extractions    NECK :  without JVD/Nodes/TM/ nl carotid upstrokes bilaterally   LUNGS: no acc muscle use,  Nl contour chest which is clear to A and P bilaterally without cough on insp or exp maneuvers   CV:  RRR  no s3 or murmur or increase in P2, and no edema   ABD:  soft and nontender with nl inspiratory excursion in the supine position. No bruits or organomegaly appreciated, bowel sounds nl  MS:  Nl gait/ ext warm without deformities, calf tenderness, cyanosis or clubbing No obvious joint restrictions   SKIN: warm  and dry without lesions    NEURO:  alert, approp, nl sensorium with  no motor or cerebellar deficits apparent.     MRI brain 08/18/15  Neg sinus study     I personally reviewed images and agree with radiology impression as follows:  CXR:   05/03/16 No active cardiopulmonary disease.    Assessment:

## 2016-07-24 ENCOUNTER — Emergency Department (HOSPITAL_COMMUNITY)
Admission: EM | Admit: 2016-07-24 | Discharge: 2016-07-24 | Disposition: A | Discharge status: Home / Self Care | Payer: BLUE CROSS/BLUE SHIELD | Attending: Emergency Medicine | Admitting: Emergency Medicine

## 2016-07-24 ENCOUNTER — Encounter (HOSPITAL_COMMUNITY): Payer: Self-pay | Admitting: Emergency Medicine

## 2016-07-24 DIAGNOSIS — Z87891 Personal history of nicotine dependence: Secondary | ICD-10-CM | POA: Diagnosis not present

## 2016-07-24 DIAGNOSIS — R400 Somnolence: Secondary | ICD-10-CM

## 2016-07-24 DIAGNOSIS — Z7982 Long term (current) use of aspirin: Secondary | ICD-10-CM | POA: Insufficient documentation

## 2016-07-24 DIAGNOSIS — I69313 Psychomotor deficit following cerebral infarction: Secondary | ICD-10-CM | POA: Insufficient documentation

## 2016-07-24 DIAGNOSIS — Z79899 Other long term (current) drug therapy: Secondary | ICD-10-CM | POA: Insufficient documentation

## 2016-07-24 DIAGNOSIS — I10 Essential (primary) hypertension: Secondary | ICD-10-CM | POA: Diagnosis not present

## 2016-07-24 DIAGNOSIS — E119 Type 2 diabetes mellitus without complications: Secondary | ICD-10-CM | POA: Insufficient documentation

## 2016-07-24 DIAGNOSIS — R5383 Other fatigue: Secondary | ICD-10-CM | POA: Insufficient documentation

## 2016-07-24 DIAGNOSIS — E669 Obesity, unspecified: Secondary | ICD-10-CM | POA: Insufficient documentation

## 2016-07-24 MED ORDER — AMLODIPINE BESYLATE 5 MG PO TABS: 10.0000 mg | ORAL_TABLET | Freq: Once | ORAL | Status: DC

## 2016-07-24 NOTE — Discharge Instructions (Signed)
Continue to observe for any signs of weakness, incoordination or confusion. If all symptoms are not resolved after the patient has rested this morning or if she develops problems with vision, use of an extremity or confusion return immediately to the emergency department.

## 2016-07-24 NOTE — ED Triage Notes (Signed)
Pt from home. Pt reports she took tramadol a hour ago for the first time. Took the medication on an empty stomach. Threw up afterwards and has been sleepy ever since then. No nausea at present. Denies taking any other new medications.  Hx of L side stroke, but no new deficits.

## 2016-07-24 NOTE — Assessment & Plan Note (Signed)
Body mass index is 30.38 kg/m.  -  trending up slightly  Lab Results  Component Value Date   TSH 0.71 02/04/2016     Contributing to gerd risk/ doe/reviewed the need and the process to achieve and maintain neg calorie balance > defer f/u primary care including intermittently monitoring thyroid status

## 2016-07-24 NOTE — Assessment & Plan Note (Addendum)
Allergy profile 07/23/2016 >  Eos 0.1 /  IgE   Pending  - Sinus CT pending  - cyclical cough protocol 07/23/2016 >>>   The most common causes of chronic cough in immunocompetent adults include the following: upper airway cough syndrome (UACS), previously referred to as postnasal drip syndrome (PNDS), which is caused by variety of rhinosinus conditions; (2) asthma; (3) GERD; (4) chronic bronchitis from cigarette smoking or other inhaled environmental irritants; (5) nonasthmatic eosinophilic bronchitis; and (6) bronchiectasis.   These conditions, singly or in combination, have accounted for up to 94% of the causes of chronic cough in prospective studies.   Other conditions have constituted no >6% of the causes in prospective studies These have included bronchogenic carcinoma, chronic interstitial pneumonia, sarcoidosis, left ventricular failure, ACEI-induced cough, and aspiration from a condition associated with pharyngeal dysfunction.    Chronic cough is often simultaneously caused by more than one condition. A single cause has been found from 38 to 82% of the time, multiple causes from 18 to 62%. Multiply caused cough has been the result of three diseases up to 42% of the time.       Most likely this is severe case of Upper airway cough syndrome (previously labeled PNDS) , is  so named because it's frequently impossible to sort out how much is  CR/sinusitis with freq throat clearing (which can be related to primary GERD)   vs  causing  secondary (" extra esophageal")  GERD from wide swings in gastric pressure that occur with throat clearing, often  promoting self use of mint and menthol lozenges that reduce the lower esophageal sphincter tone and exacerbate the problem further in a cyclical fashion.   These are the same pts (now being labeled as having "irritable larynx syndrome" by some cough centers) who not infrequently have a history of having failed to tolerate ace inhibitors,  dry powder inhalers  or biphosphonates or report having atypical/extraesophageal reflux symptoms(like coughing her way into vomiting)  that don't respond to standard doses of PPI  and are easily confused as having aecopd or asthma flares by even experienced allergists/ pulmonologists (myself included).   Of the three most common causes of  Sub-acute or recurrent or chronic cough, only one (GERD/ which she clearly has because she coughs to vomiting)  can actually contribute to/ trigger  the other two (asthma and post nasal drip syndrome)  and perpetuate the cylce of cough.  While not intuitively obvious, many patients with chronic low grade reflux do not cough until there is a primary insult that disturbs the protective epithelial barrier and exposes sensitive nerve endings.   This is typically viral but can be direct physical injury such as with an endotracheal tube.   The point is that once this occurs, it is difficult to eliminate the cycle  using anything but a maximally effective acid suppression regimen at least in the short run, accompanied by an appropriate diet to address non acid GERD and control / eliminate the cough itself for at least 3 days.   rec max rx for gerd/ suppress cough with tramadol and complete w/u as above and f/u in 2 weeks if not improved  Total time devoted to counseling  > 50 % of initial 60 min office visit:  review case with pt/ discussion of options/alternatives/ personally creating written customized instructions  in presence of pt  then going over those specific  Instructions directly with the pt including how to use all of the meds but  in particular covering each new medication in detail and the difference between the maintenance= "automatic" meds and the prns using an action plan format for the latter (If this problem/symptom => do that organization reading Left to right).  Please see AVS from this visit for a full list of these instructions which I personally wrote for this pt and  are  unique to this visit.
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
## 2016-07-24 NOTE — ED Notes (Signed)
Patient states she wants to "discharge herself"-informed patient that RN will notify MD

## 2016-07-26 ENCOUNTER — Telehealth: Payer: Self-pay | Admitting: *Deleted

## 2016-07-26 LAB — RESPIRATORY ALLERGY PROFILE REGION II ~~LOC~~
Allergen, A. alternata, m6: <0.1 kU/L
Allergen, C. Herbarum, M2: <0.1 kU/L
Allergen, Comm Silver Birch, t9: <0.1 kU/L
Allergen, D pternoyssinus,d7: <0.1 kU/L
Allergen, Mulberry, t76: <0.1 kU/L
Aspergillus fumigatus, m3: <0.1 kU/L
Box Elder IgE: <0.1 kU/L
Cockroach: <0.1 kU/L
D. farinae: <0.1 kU/L
Dog Dander: <0.1 kU/L
Elm IgE: <0.1 kU/L
IgE (Immunoglobulin E), Serum: 15 kU/L (ref <115)
Johnson Grass: <0.1 kU/L
Sheep Sorrel IgE: <0.1 kU/L

## 2016-07-26 NOTE — Telephone Encounter (Signed)
Spoke with patient regarding [facility] monitoring transfer.  Monitoring is updated through today per [facility] website.  Advised patient that the monitoring was successfully transferred to our clinic and that if we note any alerts or abnormalities, we will make her aware.  Patient is appreciative and denies additional questions or concerns at this time.

## 2016-07-26 NOTE — Progress Notes (Signed)
Spoke with patient and informed her of results and recommendations. Pt verbalized understanding and did not have any questions. Nothing further is needed.

## 2016-07-27 ENCOUNTER — Telehealth: Payer: Self-pay | Admitting: Family Medicine

## 2016-07-27 ENCOUNTER — Telehealth: Payer: Self-pay | Admitting: *Deleted

## 2016-07-27 NOTE — Telephone Encounter (Signed)
Received Physician Orders/Medication Issue Communication from [facility], forwarded to provider/SLS 07/17

## 2016-07-27 NOTE — Telephone Encounter (Signed)
[place] w/ [facility] [phone]  Is currently with pt. Pt had a visit with pulmonology on Friday for cough and given 2 new medications.   Tramadol-- adverse reaction because of allergy to hydrocodone AND sertraline has a major reaction.   Pt says that she hasn't been taking sertraline but it is listed on medication list.   She went to ER on Saturday pt thought that she was having a stroke. Pt wasn't but [place] thinks that she was having a reaction to medication. '   Advised to not take Tramadol until someone from office call her back to advise.

## 2016-07-28 ENCOUNTER — Encounter: Payer: Self-pay | Admitting: Neurology

## 2016-07-28 NOTE — Telephone Encounter (Signed)
Called pt back- she is not taking sertraline.  Dr. Melvyn Novas gave her the tramadol to use as needed but she certainly does not have to take it. She will stop using this.

## 2016-07-30 ENCOUNTER — Ambulatory Visit (INDEPENDENT_AMBULATORY_CARE_PROVIDER_SITE_OTHER): Payer: BLUE CROSS/BLUE SHIELD | Admitting: Neurology

## 2016-07-30 ENCOUNTER — Encounter: Payer: Self-pay | Admitting: Neurology

## 2016-07-30 ENCOUNTER — Other Ambulatory Visit: Payer: BLUE CROSS/BLUE SHIELD

## 2016-07-30 VITALS — BP 118/80 | HR 80 | Ht 64.0 in | Wt 174.3 lb

## 2016-07-30 DIAGNOSIS — F419 Anxiety disorder, unspecified: Secondary | ICD-10-CM

## 2016-07-30 DIAGNOSIS — G40209 Localization-related (focal) (partial) symptomatic epilepsy and epileptic syndromes with complex partial seizures, not intractable, without status epilepticus: Secondary | ICD-10-CM | POA: Diagnosis not present

## 2016-07-30 DIAGNOSIS — R4589 Other symptoms and signs involving emotional state: Secondary | ICD-10-CM

## 2016-07-30 DIAGNOSIS — Z8673 Personal history of transient ischemic attack (TIA), and cerebral infarction without residual deficits: Secondary | ICD-10-CM
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
## 2016-08-02 ENCOUNTER — Ambulatory Visit (INDEPENDENT_AMBULATORY_CARE_PROVIDER_SITE_OTHER)
Admission: RE | Admit: 2016-08-02 | Discharge: 2016-08-02 | Disposition: A | Discharge status: Home / Self Care | Payer: BLUE CROSS/BLUE SHIELD | Source: Ambulatory Visit | Attending: Internal Medicine | Admitting: Internal Medicine

## 2016-08-02 DIAGNOSIS — R058 Other specified cough: Secondary | ICD-10-CM

## 2016-08-02 DIAGNOSIS — R05 Cough: Secondary | ICD-10-CM

## 2016-08-02 NOTE — Progress Notes (Signed)
Spoke with pt and notified of results per Dr. Wert. Pt verbalized understanding and denied any questions. 

## 2016-08-10 ENCOUNTER — Ambulatory Visit (HOSPITAL_COMMUNITY)
Admission: RE | Admit: 2016-08-10 | Discharge: 2016-08-10 | Disposition: A | Discharge status: Home / Self Care | Payer: BLUE CROSS/BLUE SHIELD | Source: Ambulatory Visit | Attending: Cardiovascular Disease | Admitting: Cardiovascular Disease

## 2016-08-10 DIAGNOSIS — E785 Hyperlipidemia, unspecified: Secondary | ICD-10-CM | POA: Diagnosis not present

## 2016-08-10 DIAGNOSIS — I6523 Occlusion and stenosis of bilateral carotid arteries: Secondary | ICD-10-CM | POA: Diagnosis not present

## 2016-08-10 DIAGNOSIS — R0989 Other specified symptoms and signs involving the circulatory and respiratory systems: Secondary | ICD-10-CM

## 2016-08-10 DIAGNOSIS — I10 Essential (primary) hypertension: Secondary | ICD-10-CM | POA: Insufficient documentation

## 2016-08-10 DIAGNOSIS — Z8673 Personal history of transient ischemic attack (TIA), and cerebral infarction without residual deficits: Secondary | ICD-10-CM | POA: Diagnosis not present

## 2016-08-10 DIAGNOSIS — E119 Type 2 diabetes mellitus without complications: Secondary | ICD-10-CM | POA: Insufficient documentation

## 2016-08-16 ENCOUNTER — Telehealth: Payer: Self-pay | Admitting: Internal Medicine

## 2016-08-16 NOTE — Telephone Encounter (Signed)
No need to repeat the same meds if they didn't work > ov next available with all active meds in hand

## 2016-08-16 NOTE — Telephone Encounter (Signed)
Pt aware of rec's per MW Pt is bringing all meds to appt Pt scheduled for 08/17/16 at 11:15.  Nothing further needed.

## 2016-08-16 NOTE — Telephone Encounter (Signed)
Spoke with patient. She stated that she has had a mostly non-productive cough for the past 4 days. When it is productive, she has clear phlegm. Denied any fevers, SOB, or chest pain. She stated that she was told to call the office back after her OV if her cough had not improved. During her OV, she was prescribed prednisone and tramadol. She has finised the pred taper and she is out of the tramadol.   She wishes to use [facility] on [facility].   MW, please advise. Thanks!

## 2016-08-17 ENCOUNTER — Ambulatory Visit (INDEPENDENT_AMBULATORY_CARE_PROVIDER_SITE_OTHER): Payer: BLUE CROSS/BLUE SHIELD | Admitting: Internal Medicine

## 2016-08-17 ENCOUNTER — Ambulatory Visit (INDEPENDENT_AMBULATORY_CARE_PROVIDER_SITE_OTHER)
Admission: RE | Admit: 2016-08-17 | Discharge: 2016-08-17 | Disposition: A | Discharge status: Home / Self Care | Payer: BLUE CROSS/BLUE SHIELD | Source: Ambulatory Visit | Attending: Internal Medicine | Admitting: Internal Medicine

## 2016-08-17 ENCOUNTER — Telehealth: Payer: Self-pay | Admitting: Family Medicine

## 2016-08-17 ENCOUNTER — Encounter: Payer: Self-pay | Admitting: Internal Medicine

## 2016-08-17 VITALS — BP 128/70 | HR 80 | Temp 98.3°F | Ht 64.0 in | Wt 174.0 lb

## 2016-08-17 DIAGNOSIS — R05 Cough: Secondary | ICD-10-CM

## 2016-08-17 DIAGNOSIS — R058 Other specified cough: Secondary | ICD-10-CM

## 2016-08-17 MED ORDER — TRAMADOL HCL 50 MG PO TABS: ORAL_TABLET | ORAL | 0 refills | Status: DC

## 2016-08-17 NOTE — Telephone Encounter (Signed)
Caller name: Brittney  Relation to pt: OT from [facility] back number: [phone]    Reason for call:  Requesting verbal order extention for Home health OT 2x 2 weeks

## 2016-08-17 NOTE — Progress Notes (Signed)
Subjective:     Patient ID: Wendy [name], female   DOB: 03-Apr-1955,    MRN: [id number]      Brief patient profile:  32 yobf quit smoking 2013 with no resp problems prior then 2016 sudden onset of coughing fit one day  at work in customer service in [place] and coughed "every day since"   Says sometime later placed on lisinopril and stopped July 2017 because the cough on ACEI was much worse and never improved so referred to pulmonary clinic 07/23/2016 by Dr Edilia Bo.     History of Present Illness  07/23/2016 1st [facility] office visit/ Wert   Chief Complaint  Patient presents with  . Pulmonary Consult    Referred by Dr. Silvestre Mesi.  Pt c/o cough for the past year- prod with white sputum.  She states that her cough is "all the time" with no specific trigger.   coughs so hard Pos urinary incont and vomiting Maybe a couple of tbsp total per day mucoid sputum, no better with tessalon / zyrtec, atrovent nasal spray  Has not treid any inhalers  cough present 24/7 and interferes with sleep  rec Stop zyrtec and tessalon and atovent  First take delsym two tsp every 12 hours and supplement if needed with  tramadol 50 mg up to 2 every 4 hours to suppress the urge to cough at all or even clear your throat. Swallowing water or using ice chips/non mint and menthol containing candies (such as lifesavers or sugarless jolly ranchers) are also effective.  You should rest your voice and avoid activities that you know make you cough. Once you have eliminated the cough for 3 straight days try reducing the tramadol first,  then the delsym as tolerated.   Prednisone 10 mg take  4 each am x 2 days,   2 each am x 2 days,  1 each am x 2 days and stop (this is to eliminate allergies and inflammation from coughing) Protonix (pantoprazole) Take 30-60 min before first meal of the day and Pepcid 20 mg one bedtime plus chlorpheniramine 4 mg x 2 at bedtime (both available over the counter)  until cough is  completely gone for at least a week without the need for cough suppression GERD diet Allergy profile 07/23/2016 >  Eos 0.1 /  IgE  15 Rast neg - Sinus CT 08/02/2016 >>> 1. Clear paranasal sinus     08/17/2016  f/u ov/Wert re: refractory cough on ppi qam and zantac bid Chief Complaint  Patient presents with  . Follow-up    Cough not improving much. She is producing some clear sputum. She states she had such a violent cough a few nights ago, she vomitted. She has occ wheezing.   not compliant with recs, never got the chlorpheniramine and rarely used the tramadol in high enough dose/ freq to eliminate the cough but did admit doing some better at hs while on rx as intended but not has some sense of noct "wheeze" as well  Very unsure of meds/ names esp between generic and trade names easily confused  Not limited by breathing from desired activities  But very sedentary   No obvious patterns in day to day or daytime variability or assoc excess/ purulent sputum or mucus plugs or hemoptysis or cp or chest tightness,  or overt sinus or hb symptoms. No unusual exp hx or h/o childhood pna/ asthma or knowledge of premature birth.   Also denies any obvious fluctuation of symptoms with weather  or environmental changes or other aggravating or alleviating factors except as outlined above   Current Medications, Allergies, Complete Past Medical History, Past Surgical History, Family History, and Social History were reviewed in [facility] record.  ROS  The following are not active complaints unless bolded sore throat, dysphagia, dental problems, itching, sneezing,  nasal congestion or excess/ purulent secretions, ear ache,   fever, chills, sweats, unintended wt loss, classically pleuritic or exertional cp,  orthopnea pnd or leg swelling, presyncope, palpitations, abdominal pain, anorexia, nausea, vomiting only with coughing fits, diarrhea  or change in bowel or bladder habits, change in  stools or urine, dysuria,hematuria,  rash, arthralgias, visual complaints, headache, numbness, weakness or ataxia or problems with walking or coordination,  change in mood/affect or memory.                    Objective:   Physical Exam    amb obese bf  With extremely harsh upper airway cough and helpless/ hopeless affect     08/17/2016        174   07/23/16 177 lb (80.3 kg)  07/20/16 176 lb (79.8 kg)  06/28/16 169 lb 9.6 oz (76.9 kg)    Vital signs reviewed        HEENT: nl  oropharynx. Nl external ear canals without cough reflex - moderate bilateral non-specific turbinate edema  With crusting on L  - upper dentures s/p multiple tooth extractions    NECK :  without JVD/Nodes/TM/ nl carotid upstrokes bilaterally   LUNGS: no acc muscle use,  Nl contour chest which is clear to A and P bilaterally without cough on insp or exp maneuvers   CV:  RRR  no s3 or murmur or increase in P2, and no edema   ABD:  soft and nontender with nl inspiratory excursion in the supine position. No bruits or organomegaly appreciated, bowel sounds nl  MS:  Nl gait/ ext warm without deformities, calf tenderness, cyanosis or clubbing No obvious joint restrictions   SKIN: warm and dry without lesions    NEURO:  alert, approp, nl sensorium with  no motor or cerebellar deficits apparent.    CXR PA and Lateral:   08/17/2016 :    I personally reviewed images and agree with radiology impression as follows:   No active cardiopulmonary disease.        Assessment:

## 2016-08-17 NOTE — Patient Instructions (Addendum)
For drainage / throat tickle try take CHLORPHENIRAMINE  4 mg - take up to one every 4 hours as needed - available over the counter-  May make you sleepy so only take it when you can afford to be sleepy  Take delsym two tsp every 12 hours and supplement if needed with  tramadol 50 mg up to 2 every 4 hours to suppress the urge to cough. Swallowing water or using ice chips/non mint and menthol containing candies (such as lifesavers or sugarless jolly ranchers) are also effective.  You should rest your voice and avoid activities that you know make you cough.  Once you have eliminated the cough for 3 straight days try reducing the tramadol first,  then the delsym as tolerated.    Continue pantoprazole Take 30-60 min before first meal of the day and Pepcid 20 mg at bedtime   GERD (REFLUX)  is an extremely common cause of respiratory symptoms just like yours , many times with no obvious heartburn at all.    It can be treated with medication, but also with lifestyle changes including elevation of the head of your bed (ideally with 6 inch  bed blocks),  Smoking cessation, avoidance of late meals, excessive alcohol, and avoid fatty foods, chocolate, peppermint, colas, red wine, and acidic juices such as orange juice.  NO MINT OR MENTHOL PRODUCTS SO NO COUGH DROPS  USE SUGARLESS CANDY INSTEAD (Jolley ranchers or Stover's or Life Savers) or even ice chips will also do - the key is to swallow to prevent all throat clearing. NO OIL BASED VITAMINS - use powdered substitutes.    Please remember to go to the  x-ray department downstairs in the basement  for your tests - we will call you with the results when they are available.  Call if not improved in a week or two for referral to a throat specialist at [facility] (Dr Joya Gaskins)

## 2016-08-17 NOTE — Telephone Encounter (Signed)
Returned call from [place] with [facility]. No answer, left voicemail giving approval  for Home Health OT extension 2 times per week x 2 weeks as requested.

## 2016-08-18 ENCOUNTER — Telehealth: Payer: Self-pay | Admitting: Family Medicine

## 2016-08-18 NOTE — Progress Notes (Signed)
Spoke with pt and notified of results per Dr. Wert. Pt verbalized understanding and denied any questions. 

## 2016-08-18 NOTE — Telephone Encounter (Signed)
Pt dropped off paperwork to be filled out by provider ([facility] paperwork in an envelope) Pt would like to be called when document ready at [phone]. Document put at front office tray.

## 2016-08-19 NOTE — Assessment & Plan Note (Addendum)
Cough since 2016 made worse by acei use d/c'd in [facility] 2017  Allergy profile 07/23/2016 >  Eos 0.1 /  IgE  15 Rast neg - Sinus CT 08/02/2016 >>> 1. Clear paranasal sinuses. - cyclical cough protocol 07/23/2016 and 08/17/2016   Did not follow the protocol from last ov Reviewed: The standardized cough guidelines published in Chest by Lissa Morales in 2006 are still the best available and consist of a multiple step process (up to 12!) , not a single office visit,  and are intended  to address this problem logically,  with an alogrithm dependent on response to empiric treatment at  each progressive step  to determine a specific diagnosis with  minimal addtional testing needed. Therefore if adherence is an issue or can't be accurately verified(as is clearly the case here) ,  it's very unlikely the standard evaluation and treatment will be successful here.    Furthermore, response to therapy (other than acute cough suppression, which should only be used short term with avoidance of narcotic containing cough syrups if possible), can be a gradual process for which the patient is not likely to  perceive immediate benefit.  Unlike going to an eye doctor where the best perscription is almost always the first one and is immediately effective, this is almost never the case in the management of chronic cough syndromes. Therefore the patient needs to commit up front to consistently adhere to recommendations  for up to 6 weeks of therapy directed at the likely underlying problem(s) before the response can be reasonably evaluated.    rec redo the protocol then refer to [facility] voice center if not satisfied noting can't use gabapentin trial as would normally consider as she is on Kepra already for sz though might be a candidate for further gi w/u for gerd and trial of elavil hs per guidelines   I had an extended discussion with the patient and sister reviewing all relevant studies completed to date and  lasting 15 to 20 minutes  of a 25 minute visit    Each maintenance medication was reviewed in detail including most importantly the difference between maintenance and prns and under what circumstances the prns are to be triggered using an action plan format that is not reflected in the computer generated alphabetically organized AVS.    Please see AVS for specific instructions unique to this visit that I personally wrote and verbalized to the the pt in detail and then reviewed with pt  by my nurse highlighting any  changes in therapy recommended at today's visit to their plan of care.

## 2016-08-23 ENCOUNTER — Telehealth: Payer: Self-pay | Admitting: *Deleted

## 2016-08-23 ENCOUNTER — Ambulatory Visit (INDEPENDENT_AMBULATORY_CARE_PROVIDER_SITE_OTHER): Payer: BLUE CROSS/BLUE SHIELD | Admitting: *Deleted

## 2016-08-23 DIAGNOSIS — I639 Cerebral infarction, unspecified: Secondary | ICD-10-CM | POA: Diagnosis not present

## 2016-08-23 NOTE — Telephone Encounter (Signed)
Received PT Summary/Discharge, forwarded to provider/SLS 08/13

## 2016-08-23 NOTE — Telephone Encounter (Signed)
Paperwork is for permanent disability for [employer] [place] [employer]; have never completed this type of form before, so forwarding to provider blank, since it is a Educational psychologist form/SLS 08/13

## 2016-08-25 NOTE — Telephone Encounter (Signed)
I called pt- she needs preliminary paperwork for getting her old student loans forgiven.  This is not permanent disability paperwork.  Explained to pt that I will complete these papers for her to pick up today.  However if this moves forward and she does wish to obtain federal/ medicare permanent disability she will need to seek the services of a disability lawyer as these proceedings are quite complex.  I do support and agree with her plan to seek permanent disability following her strokes.

## 2016-08-28 LAB — CUP PACEART REMOTE DEVICE CHECK
Implantable Pulse Generator Implant Date: 20170829
MDC IDC SESS DTM: 20180810234121

## 2016-08-28 NOTE — Progress Notes (Signed)
Carelink summary report received. Battery status OK. Normal device function. No new symptom episodes, tachy episodes, brady, or pause episodes. No new AF episodes. Monthly summary reports and ROV/PRN 

## 2016-09-01 ENCOUNTER — Telehealth: Payer: Self-pay | Admitting: Family Medicine

## 2016-09-01 NOTE — Telephone Encounter (Signed)
Patrice - Case Manager with [facility] in to make provider aware that they are available and is helping pt with her health. She said that they are a service that is available to pt through her insurance.

## 2016-09-03 ENCOUNTER — Telehealth: Payer: Self-pay

## 2016-09-03 NOTE — Telephone Encounter (Signed)
[place] Plan of Care received from [facility]; form placed in PCP red folder for completion.

## 2016-09-06 ENCOUNTER — Institutional Professional Consult (permissible substitution): Payer: BLUE CROSS/BLUE SHIELD | Admitting: Internal Medicine

## 2016-09-06 NOTE — Progress Notes (Signed)
Loop Recorder Summary Report 

## 2016-09-07 ENCOUNTER — Encounter: Payer: Self-pay | Admitting: Family Medicine

## 2016-09-07 NOTE — Progress Notes (Unsigned)
Plan of Care mailed to [facility]. Attempted to fax several times would not go through.

## 2016-09-14 ENCOUNTER — Telehealth: Payer: Self-pay | Admitting: *Deleted

## 2016-09-14 NOTE — Telephone Encounter (Signed)
Entered in error

## 2016-09-20 ENCOUNTER — Ambulatory Visit (INDEPENDENT_AMBULATORY_CARE_PROVIDER_SITE_OTHER): Payer: BLUE CROSS/BLUE SHIELD | Admitting: *Deleted

## 2016-09-20 DIAGNOSIS — I639 Cerebral infarction, unspecified: Secondary | ICD-10-CM | POA: Diagnosis not present

## 2016-09-20 NOTE — Progress Notes (Signed)
Carelink Summary Report / Loop Recorder 

## 2016-09-22 ENCOUNTER — Ambulatory Visit: Payer: BLUE CROSS/BLUE SHIELD | Admitting: Neurology

## 2016-09-22 LAB — CUP PACEART REMOTE DEVICE CHECK
Implantable Pulse Generator Implant Date: 20170829
MDC IDC SESS DTM: 20180909234412

## 2016-10-12 ENCOUNTER — Telehealth: Payer: Self-pay | Admitting: Family Medicine

## 2016-10-12 NOTE — Telephone Encounter (Signed)
Pt uses [facility]. Pt request script for [place] glucometer.

## 2016-10-13 ENCOUNTER — Encounter: Payer: Self-pay | Admitting: Family Medicine

## 2016-10-15 ENCOUNTER — Ambulatory Visit (INDEPENDENT_AMBULATORY_CARE_PROVIDER_SITE_OTHER): Payer: BLUE CROSS/BLUE SHIELD | Admitting: Internal Medicine

## 2016-10-15 ENCOUNTER — Encounter: Payer: Self-pay | Admitting: Internal Medicine

## 2016-10-15 ENCOUNTER — Telehealth: Payer: Self-pay | Admitting: Emergency Medicine

## 2016-10-15 ENCOUNTER — Other Ambulatory Visit: Payer: Self-pay | Admitting: Emergency Medicine

## 2016-10-15 VITALS — BP 130/80 | HR 93 | Ht 64.5 in | Wt 180.0 lb

## 2016-10-15 DIAGNOSIS — E669 Obesity, unspecified: Secondary | ICD-10-CM

## 2016-10-15 DIAGNOSIS — Z23 Encounter for immunization: Secondary | ICD-10-CM | POA: Diagnosis not present

## 2016-10-15 DIAGNOSIS — R05 Cough: Secondary | ICD-10-CM | POA: Diagnosis not present

## 2016-10-15 DIAGNOSIS — R058 Other specified cough: Secondary | ICD-10-CM

## 2016-10-15 MED ORDER — TRAMADOL HCL 50 MG PO TABS: ORAL_TABLET | ORAL | 0 refills | Status: DC

## 2016-10-15 MED ORDER — FAMOTIDINE 20 MG PO TABS: ORAL_TABLET | ORAL | 2 refills | Status: DC

## 2016-10-15 MED ORDER — PANTOPRAZOLE SODIUM 40 MG PO TBEC: 40.0000 mg | DELAYED_RELEASE_TABLET | Freq: Every day | ORAL | 2 refills | Status: DC

## 2016-10-15 MED ORDER — PREDNISONE 10 MG PO TABS: ORAL_TABLET | ORAL | 0 refills | Status: DC

## 2016-10-15 NOTE — Assessment & Plan Note (Signed)
Body mass index is 30.42 kg/m.  -  trending up  Lab Results  Component Value Date   TSH 0.71 02/04/2016     Contributing to gerd risk/ doe/reviewed the need and the process to achieve and maintain neg calorie balance > defer f/u primary care including intermittently monitoring thyroid status

## 2016-10-15 NOTE — Telephone Encounter (Signed)
Pt uses [place]. Pt request script for [employer]

## 2016-10-15 NOTE — Telephone Encounter (Signed)
Spoke to pt. Scheduled follow up appt in November.

## 2016-10-15 NOTE — Telephone Encounter (Signed)
Called pt to discus- I really don't think she needs this device, she is not on any DM meds and her a1c is under 6.5%.  However, she is due for a follow-up.  She states understanding, we will not order the monitor.  Wendy [name] can we please get her set up for a follow-up visit in the next 1-2 months? Thank you!

## 2016-10-15 NOTE — Assessment & Plan Note (Addendum)
Cough since 2016 made worse by acei use d/c'd in 07/2015  Allergy profile 07/23/2016 >  Eos 0.1 /  IgE  15 Rast neg - Sinus CT 08/02/2016 >>> 1. Clear paranasal sinuses. - cyclical cough protocol 07/23/2016 and 08/17/2016   - Spirometry 10/15/2016  Wnl/ very min curvature   - repeat cyclical cough protocol 10/15/2016 with Prednisone 10 mg take  4 each am x 2 days,   2 each am x 2 days,  1 each am x 2 days and stop and schedule MCT on gerd rx x 2 weeks then refer to [place] if still coughing and mct neg    Lack of cough resolution on a verified empirical regimen(which we have not been able to achieve)  could mean an alternative diagnosis, persistence of the disease state (eg sinusitis- ruled out - or bronchiectasis) , or inadequacy of currently available therapy (eg no medical rx available for non-acid gerd) which is clearly an issue as coughs to point of vomiting  Of the three most common causes of  Sub-acute or recurrent or chronic cough, only one (GERD)  can actually contribute to/ trigger  the other two (asthma and post nasal drip syndrome)  and perpetuate the cylce of cough.  While not intuitively obvious, many patients with chronic low grade reflux do not cough until there is a primary insult that disturbs the protective epithelial barrier and exposes sensitive nerve endings.   This is typically viral but can be direct physical injury such as with an endotracheal tube.   The point is that once this occurs, it is difficult to eliminate the cycle  using anything but a maximally effective acid suppression regimen at least in the short run, accompanied by an appropriate diet to address non acid GERD and control / eliminate the cough itself for at least 3 days then proceed with MCT as above   I had an extended discussion with the patient reviewing all relevant studies completed to date and  lasting 15 to 20 minutes of a 25 minute visit    Each maintenance medication was reviewed in detail including most  importantly the difference between maintenance and prns and under what circumstances the prns are to be triggered using an action plan format that is not reflected in the computer generated alphabetically organized AVS.    Please see AVS for specific instructions unique to this visit that I personally wrote and verbalized to the the pt in detail and then reviewed with pt  by my nurse highlighting any  changes in therapy recommended at today's visit to their plan of care.

## 2016-10-15 NOTE — Patient Instructions (Addendum)
The key to effective treatment for your cough is eliminating the non-stop cycle of cough you're stuck in long enough to let your airway heal completely and then see if there is anything still making you cough once you stop the cough suppression, but this should take no more than 5 days to figure out  First take delsym two tsp every 12 hours and supplement if needed with  tramadol 50 mg up to 2 every 4 hours to suppress the urge to cough at all or even clear your throat. Swallowing water or using ice chips/non mint and menthol containing candies (such as lifesavers or sugarless jolly ranchers) are also effective.  You should rest your voice and avoid activities that you know make you cough.  Once you have eliminated the cough for 3 straight days try reducing the tramadol first,  then the delsym as tolerated.    Prednisone 10 mg take  4 each am x 2 days,   2 each am x 2 days,  1 each am x 2 days and stop (this is to eliminate allergies and inflammation from coughing)  Protonix (pantoprazole) Take 30-60 min before first meal of the day and Pepcid 20 mg one bedtime plus chlorpheniramine 4 mg x 2 at bedtime (both available over the counter)  until cough is completely gone for at least a week without the need for cough suppression  GERD (REFLUX)  is an extremely common cause of respiratory symptoms, many times with no significant heartburn at all.    It can be treated with medication, but also with lifestyle changes including avoidance of late meals, excessive alcohol, smoking cessation, and avoid fatty foods, chocolate, peppermint, colas, red wine, and acidic juices such as orange juice.  NO MINT OR MENTHOL PRODUCTS SO NO COUGH DROPS   USE HARD CANDY INSTEAD (jolley ranchers or Stover's or Lifesavers (all available in sugarless versions) NO OIL BASED VITAMINS - use powdered substitutes.  Please see patient coordinator before you leave today  to schedule Methacholine challenge test in 2 weeks, not sooner,  and if this is negative we will be referring you to Dr Joya Gaskins at [facility]

## 2016-10-15 NOTE — Progress Notes (Signed)
Subjective:     Patient ID: Wendy [name], female   DOB: 11/08/1955,    MRN: [id number]     Brief patient profile:  1 yobf quit smoking 2013 with no resp problems prior then 2016 sudden onset of coughing fit one day  at work in customer service in [place] and coughed "every day since"   Says sometime later placed on lisinopril and stopped July 2017 because the cough on ACEI was much worse and never improved so referred to pulmonary clinic 07/23/2016 by Dr Edilia Bo.     History of Present Illness  07/23/2016 1st [facility] office visit/ Ossiel Marchio   Chief Complaint  Patient presents with  . Pulmonary Consult    Referred by Dr. Silvestre Mesi.  Pt c/o cough for the past year- prod with white sputum.  She states that her cough is "all the time" with no specific trigger.   coughs so hard Pos urinary incont and vomiting Maybe a couple of tbsp total per day mucoid sputum, no better with tessalon / zyrtec, atrovent nasal spray  Has not treid any inhalers  cough present 24/7 and interferes with sleep  rec Stop zyrtec and tessalon and atovent  First take delsym two tsp every 12 hours and supplement if needed with  tramadol 50 mg up to 2 every 4 hours to suppress the urge to cough at all or even clear your throat. Swallowing water or using ice chips/non mint and menthol containing candies (such as lifesavers or sugarless jolly ranchers) are also effective.  You should rest your voice and avoid activities that you know make you cough. Once you have eliminated the cough for 3 straight days try reducing the tramadol first,  then the delsym as tolerated.   Prednisone 10 mg take  4 each am x 2 days,   2 each am x 2 days,  1 each am x 2 days and stop (this is to eliminate allergies and inflammation from coughing) Protonix (pantoprazole) Take 30-60 min before first meal of the day and Pepcid 20 mg one bedtime plus chlorpheniramine 4 mg x 2 at bedtime (both available over the counter)  until cough is  completely gone for at least a week without the need for cough suppression GERD diet Allergy profile 07/23/2016 >  Eos 0.1 /  IgE  15 Rast neg - Sinus CT 08/02/2016 >>> 1. Clear paranasal sinus     08/17/2016  f/u ov/Carrol Bondar re: refractory cough on ppi qam and zantac bid Chief Complaint  Patient presents with  . Follow-up    Cough not improving much. She is producing some clear sputum. She states she had such a violent cough a few nights ago, she vomitted. She has occ wheezing.   not compliant with recs, never got the chlorpheniramine and rarely used the tramadol in high enough dose/ freq to eliminate the cough but did admit doing some better at hs while on rx as intended but not has some sense of noct "wheeze" as well Very unsure of meds/ names esp between generic and trade names easily confused  rec For drainage / throat tickle try take CHLORPHENIRAMINE  4 mg - take up to one every 4 hours as needed - available over the counter-  May make you sleepy so only take it when you can afford to be sleepy Take delsym two tsp every 12 hours and supplement if needed with  tramadol 50 mg up to 2 every 4 hours to suppress the urge to cough. Swallowing  water or using ice chips/non mint and menthol containing candies (such as lifesavers or sugarless jolly ranchers) are also effective.  You should rest your voice and avoid activities that you know make you cough. Once you have eliminated the cough for 3 straight days try reducing the tramadol first,  then the delsym as tolerated.   Continue pantoprazole Take 30-60 min before first meal of the day and Pepcid 20 mg at bedtime  GERD diet   Call if not improved in a week or two for referral to a throat specialist at [place] (Dr Joya Gaskins)    10/15/2016  f/u ov/Raeana Blinn re:  Chief Complaint  Patient presents with  . Acute Visit    Cough is not improving since the last visit. She is coughing until she is vomiting sometimes. Cough is non prod.   never took 2 x h1 at hs  Did  improve while on tramadol but did not take as prescribed and got 75% improved until ran out, not clear how much she actually used, did not bring bottles back as req  Did not call for Dr Joya Gaskins eval  Cough to point of vomiting 25/7 now   No obvious day to day or daytime variability or assoc excess/ purulent sputum or mucus plugs or hemoptysis or cp or chest tightness, subjective wheeze or overt sinus or hb symptoms. No unusual exp hx or h/o childhood pna/ asthma or knowledge of premature birth.   Also denies any obvious fluctuation of symptoms with weather or environmental changes or other aggravating or alleviating factors except as outlined above   Current Allergies, Complete Past Medical History, Past Surgical History, Family History, and Social History were reviewed in [employer] record.  ROS  The following are not active complaints unless bolded Hoarseness, sore throat, dysphagia, dental problems, itching, sneezing,  nasal congestion or discharge of excess mucus or purulent secretions, ear ache,   fever, chills, sweats, unintended wt loss or wt gain, classically pleuritic or exertional cp,  orthopnea pnd or leg swelling, presyncope, palpitations, abdominal pain, anorexia, nausea, vomiting, diarrhea  or change in bowel habits or change in bladder habits, change in stools or change in urine, dysuria, hematuria,  rash, arthralgias, visual complaints, headache, numbness, weakness or ataxia or problems with walking or coordination,  change in mood/affect or memory.        Current Meds  Medication Sig  . amLODipine (NORVASC) 10 MG tablet Take 1 tablet (10 mg total) by mouth daily.  Marland Kitchen aspirin EC 81 MG tablet Take 81 mg by mouth daily.  Marland Kitchen dextromethorphan (DELSYM) 30 MG/5ML liquid 2 tsp every 12 hours as needed  . levETIRAcetam (KEPPRA) 1000 MG tablet Take 1 tablet (1,000 mg total) by mouth 2 (two) times daily.  . rosuvastatin (CRESTOR) 20 MG tablet Take 1 tablet (20 mg total)  by mouth daily.           Objective:  Physical Exam    amb obese bf  Abn affect, never called during interview or exam   10/15/2016       180   08/17/2016        174   07/23/16 177 lb (80.3 kg)  07/20/16 176 lb (79.8 kg)  06/28/16 169 lb 9.6 oz (76.9 kg)    Vital signs reviewed  - Note on arrival 02 sats  99% on RA        HEENT: nl  oropharynx. Nl external ear canals without cough reflex - moderate bilateral non-specific  turbinate edema  With crusting on L  - upper dentures s/p multiple tooth extractions    NECK :  without JVD/Nodes/TM/ nl carotid upstrokes bilaterally   LUNGS: no acc muscle use,  Nl contour chest which is clear to A and P bilaterally without cough on insp or exp maneuvers   CV:  RRR  no s3 or murmur or increase in P2, and no edema   ABD:  soft and nontender with nl inspiratory excursion in the supine position. No bruits or organomegaly appreciated, bowel sounds nl  MS:  Nl gait/ ext warm without deformities, calf tenderness, cyanosis or clubbing No obvious joint restrictions   SKIN: warm and dry without lesions    NEURO:  alert, approp, nl sensorium with  no motor or cerebellar deficits apparent.    CXR PA and Lateral:   08/17/2016 :    I personally reviewed images and agree with radiology impression as follows:   No active cardiopulmonary disease.        Assessment:

## 2016-10-19 ENCOUNTER — Ambulatory Visit (INDEPENDENT_AMBULATORY_CARE_PROVIDER_SITE_OTHER): Payer: BLUE CROSS/BLUE SHIELD | Admitting: *Deleted

## 2016-10-19 DIAGNOSIS — I639 Cerebral infarction, unspecified: Secondary | ICD-10-CM | POA: Diagnosis not present

## 2016-10-20 NOTE — Progress Notes (Signed)
Carelink Summary Report / Loop Recorder 

## 2016-10-25 ENCOUNTER — Ambulatory Visit (HOSPITAL_COMMUNITY)
Admission: RE | Admit: 2016-10-25 | Discharge: 2016-10-25 | Disposition: A | Discharge status: Home / Self Care | Payer: BLUE CROSS/BLUE SHIELD | Source: Ambulatory Visit | Attending: Internal Medicine | Admitting: Internal Medicine

## 2016-10-25 DIAGNOSIS — R058 Other specified cough: Secondary | ICD-10-CM

## 2016-10-25 DIAGNOSIS — R05 Cough: Secondary | ICD-10-CM | POA: Diagnosis present

## 2016-10-25 DIAGNOSIS — Z87891 Personal history of nicotine dependence: Secondary | ICD-10-CM | POA: Insufficient documentation

## 2016-10-25 DIAGNOSIS — J449 Chronic obstructive pulmonary disease, unspecified: Secondary | ICD-10-CM | POA: Insufficient documentation

## 2016-10-25 LAB — PULMONARY FUNCTION TEST
FEF 25-75 PRE: 1.44 L/s
FEF 25-75 Post: 0.96 L/sec
FEF2575-%Change-Post: -33 %
FEF2575-%Pred-Post: 49 %
FEF2575-%Pred-Pre: 73 %
FEV1-%CHANGE-POST: -8 %
FEV1-%PRED-POST: 75 %
FEV1-%PRED-PRE: 82 %
FEV1-PRE: 1.66 L
FEV1-Post: 1.52 L
FEV1FVC-%Change-Post: -3 %
FEV1FVC-%PRED-PRE: 97 %
FEV6-%Change-Post: -4 %
FEV6-%PRED-POST: 82 %
FEV6-%PRED-PRE: 86 %
FEV6-POST: 2.05 L
FEV6-PRE: 2.16 L
FEV6FVC-%PRED-POST: 104 %
FEV6FVC-%PRED-PRE: 104 %
FVC-%CHANGE-POST: -4 %
FVC-%PRED-PRE: 83 %
FVC-%Pred-Post: 79 %
FVC-PRE: 2.16 L
FVC-Post: 2.05 L
POST FEV6/FVC RATIO: 100 %
PRE FEV6/FVC RATIO: 100 %
Post FEV1/FVC ratio: 74 %
Pre FEV1/FVC ratio: 77 %

## 2016-10-25 LAB — CUP PACEART REMOTE DEVICE CHECK
MDC IDC PG IMPLANT DT: 20170829
MDC IDC SESS DTM: 20181010000822

## 2016-10-25 MED ORDER — METHACHOLINE 0.0625 MG/ML NEB SOLN
2.0000 mL | Freq: Once | RESPIRATORY_TRACT | Status: AC
  Administered 2016-10-25: 0.125 mg via RESPIRATORY_TRACT
  Filled 2016-10-25: qty 2

## 2016-10-25 MED ORDER — ALBUTEROL SULFATE (2.5 MG/3ML) 0.083% IN NEBU
2.5000 mg | INHALATION_SOLUTION | Freq: Once | RESPIRATORY_TRACT | Status: AC
Start: 2016-10-25 — End: 2016-10-25
  Administered 2016-10-25: 2.5 mg via RESPIRATORY_TRACT

## 2016-10-25 MED ORDER — METHACHOLINE 0.25 MG/ML NEB SOLN
2.0000 mL | Freq: Once | RESPIRATORY_TRACT | Status: AC
  Administered 2016-10-25: 0.5 mg via RESPIRATORY_TRACT
  Filled 2016-10-25: qty 2

## 2016-10-25 MED ORDER — METHACHOLINE 4 MG/ML NEB SOLN
2.0000 mL | Freq: Once | RESPIRATORY_TRACT | Status: DC
  Filled 2016-10-25: qty 2

## 2016-10-25 MED ORDER — METHACHOLINE 1 MG/ML NEB SOLN
2.0000 mL | Freq: Once | RESPIRATORY_TRACT | Status: AC
  Administered 2016-10-25: 2 mg via RESPIRATORY_TRACT
  Filled 2016-10-25: qty 2

## 2016-10-25 MED ORDER — SODIUM CHLORIDE 0.9 % IN NEBU
3.0000 mL | INHALATION_SOLUTION | Freq: Once | RESPIRATORY_TRACT | Status: AC
  Administered 2016-10-25: 3 mL via RESPIRATORY_TRACT
  Filled 2016-10-25: qty 3

## 2016-10-25 MED ORDER — METHACHOLINE 16 MG/ML NEB SOLN
2.0000 mL | Freq: Once | RESPIRATORY_TRACT | Status: DC
  Filled 2016-10-25: qty 2

## 2016-10-26 ENCOUNTER — Encounter: Payer: Self-pay | Admitting: Cardiovascular Disease

## 2016-10-26 ENCOUNTER — Ambulatory Visit (INDEPENDENT_AMBULATORY_CARE_PROVIDER_SITE_OTHER): Payer: BLUE CROSS/BLUE SHIELD | Admitting: Cardiovascular Disease

## 2016-10-26 VITALS — BP 138/82 | HR 85 | Ht 64.5 in | Wt 178.8 lb

## 2016-10-26 DIAGNOSIS — I700 Atherosclerosis of aorta: Secondary | ICD-10-CM

## 2016-10-26 DIAGNOSIS — E7849 Other hyperlipidemia: Secondary | ICD-10-CM

## 2016-10-26 DIAGNOSIS — E119 Type 2 diabetes mellitus without complications: Secondary | ICD-10-CM

## 2016-10-26 DIAGNOSIS — I639 Cerebral infarction, unspecified: Secondary | ICD-10-CM

## 2016-10-26 DIAGNOSIS — I6523 Occlusion and stenosis of bilateral carotid arteries: Secondary | ICD-10-CM | POA: Diagnosis not present

## 2016-10-26 DIAGNOSIS — I10 Essential (primary) hypertension: Secondary | ICD-10-CM | POA: Diagnosis not present

## 2016-10-26 DIAGNOSIS — Z79899 Other long term (current) drug therapy: Secondary | ICD-10-CM

## 2016-10-26 NOTE — Patient Instructions (Signed)
Medication Instructions:  Your physician recommends that you continue on your current medications as directed. Please refer to the Current Medication list given to you today.  Labwork: Please return for FASTING labs (CMET, CBC, Lipid, TSH, HmgA1C)  Our in office lab hours are Monday-Friday 8:00-4:30, closed for lunch 1-2 pm.  No appointment needed.  Follow-Up: Your physician wants you to follow-up in: 6 months with Dr. Claiborne Billings.  You will receive a reminder letter in the mail two months in advance. If you don't receive a letter, please call our office to schedule the follow-up appointment.   Any Other Special Instructions Will Be Listed Below (If Applicable).     If you need a refill on your cardiac medications before your next appointment, please call your pharmacy.
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
## 2016-11-01 ENCOUNTER — Ambulatory Visit (INDEPENDENT_AMBULATORY_CARE_PROVIDER_SITE_OTHER): Payer: BLUE CROSS/BLUE SHIELD | Admitting: Neurology

## 2016-11-01 ENCOUNTER — Encounter: Payer: Self-pay | Admitting: Neurology

## 2016-11-01 VITALS — BP 124/68 | HR 102 | Ht 64.0 in | Wt 181.0 lb

## 2016-11-01 DIAGNOSIS — F419 Anxiety disorder, unspecified: Secondary | ICD-10-CM | POA: Diagnosis not present

## 2016-11-01 DIAGNOSIS — R4589 Other symptoms and signs involving emotional state: Secondary | ICD-10-CM | POA: Diagnosis not present

## 2016-11-01 DIAGNOSIS — G40209 Localization-related (focal) (partial) symptomatic epilepsy and epileptic syndromes with complex partial seizures, not intractable, without status epilepticus: Secondary | ICD-10-CM | POA: Diagnosis not present

## 2016-11-01 DIAGNOSIS — Z8673 Personal history of transient ischemic attack (TIA), and cerebral infarction without residual deficits: Secondary | ICD-10-CM

## 2016-11-01 MED ORDER — LEVETIRACETAM 1000 MG PO TABS: 1000.0000 mg | ORAL_TABLET | Freq: Two times a day (BID) | ORAL | 3 refills | Status: DC

## 2016-11-01 NOTE — Patient Instructions (Signed)
1. Continue Keppra 1000mg  twice a day 2. Refer to Behavioral Medicine for counseling 3. Follow-up in 6 months, call for any changes  Seizure Precautions: 1. If medication has been prescribed for you to prevent seizures, take it exactly as directed.  Do not stop taking the medicine without talking to your doctor first, even if you have not had a seizure in a long time.   2. Avoid activities in which a seizure would cause danger to yourself or to others.  Don't operate dangerous machinery, swim alone, or climb in high or dangerous places, such as on ladders, roofs, or girders.  Do not drive unless your doctor says you may.  3. If you have any warning that you may have a seizure, lay down in a safe place where you can't hurt yourself.    4.  No driving for 6 months from last seizure, as per [facility].   Please refer to the following link on the [place] website for more information: http://[email or web address].org/answerplace/Social/driving/drivingu.cfm   5.  Maintain good sleep hygiene. Avoid alcohol.  6.  Contact your doctor if you have any problems that may be related to the medicine you are taking.  7.  Call 911 and bring the patient back to the ED if:        A.  The seizure lasts longer than 5 minutes.       B.  The patient doesn't awaken shortly after the seizure  C.  The patient has new problems such as difficulty seeing, speaking or moving  D.  The patient was injured during the seizure  E.  The patient has a temperature over 102 F (39C)  F.  The patient vomited and now is having trouble breathing

## 2016-11-01 NOTE — Progress Notes (Signed)
NEUROLOGY FOLLOW UP OFFICE NOTE  Wendy [name] [id number] 1955/08/22  HISTORY OF PRESENT ILLNESS: I had the pleasure of seeing Wendy [name] in follow-up in the neurology clinic on 11/01/2016.  The patient was last seen 3 months ago for recurrent stroke and possible seizure. She is accompanied by her sister Solmon Ice who helps supplement the history today. Since her last visit, she denies any further episodes of decreased awareness since April 2017. 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 She has had a loop recorder since August 2017 with no significant events noted. She lives alone and denies any further episodes of decreased responsiveness. She has been taking Keppra 1000mg  BID without side effects. She takes aspirin regularly. She denies any headaches, vision changes, jerking movements. She reports her left side is still the same. 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 Her sister denies any staring/unresponsive episodes. She continues to report feeling like she is "not whole" in her head. She has difficulty describing the sensation, describing it like an emptiness or void in her head all the time. She denies any confusion or speech difficulties, "just feels like there is something missing." She feels a lack of confidence in her thought processes. It does not affect daily activities. She does not drive.   HPI 07/30/2016: This is a 61 yo LH woman with a history of hypertension, hyperlipidemia, diabetes, and strokes in March and April 2017. She recalls the first stroke happened at work in March 2017, she started feeling badly, vision was impaired, she felt feverish and dizzy. Her daughter brought her to the ER and she was admitted for a week. There is one Neurology note from [place], [place] on [facility], she had an elevated BP of 240/119 on arrival to ER, right facial droop, left hemisensory deficits, dysarthria and dysphagia. MRI brain had shown acute infarcts in the right temporal, right middle  cerebellar peduncle, and right dorsolateral medulla. Stroke was attributed to untreated hypertension and she was discharged home on aspirin with no residual deficits. She reports that a week or so later, she was back to the hospital in April, she recalls sliding off the bed with her left side paralyzed, unable to answer the doorbell. Her daughter found her on the floor with saliva coming out of the left side of her mouth. She reports she was awake at this time. 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 She had left hemiparesis, decreased consciousness, left hemisensory deficits, extinction to double simultaneous stimulation. She was out of the window for TPA and required a PEG briefly for dysphagia. She had a loop recorder in August 2017 with no episodes of atrial fibrillation. Echo showed EF of 70%, no PFO. She continues on a daily baby aspirin. 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 She denies any seizures, presumably due to decreased consciousness when she was found on the ground, she was started on Keppra 1000mg  BID. She had prolonged EEG in [place] while admitted for the second stroke, no seizure activity seen, there was focal right temporal theta/delta slowing and diffuse slowing of the background.   She moved to [facility] in January 2018 but is currently awaiting news on her housing situation. She has been living in different motels for the past 6 months and states she does not feel settled. She has family in [place] but they do not have space for her. She denies being told of any  staring/unresponsive episodes. She denies any gaps in time, olfactory/gustatory hallucinations, deja vu, rising epigastric sensation, myoclonic jerks. The left side of her face and left arm are constantly numb. Sometimes her left arm becomes weaker/drops down, with no jerking movements. Left leg is fine. She denies any falls. She has noticed the past  couple of days feeling like she is "not whole" in her head, "like a spaciness." She has had this sensation several times on and off, then it would go away and she would "migrate back to whatever my reality is." She feels nervous and scared a lot and does not feel like herself. She feels "all alone, almost like I am trapped inside my body." She was in the ER a week ago for somnolence, attributed to newly started Tramadol and sertraline. She has stopped the sertraline, but continues to take prn Tramadol for pain from coughing spells. She reports being told it is due to GERD and has started Pepcid. She took 2 Tramadol yesterday, sometimes she takes 4 a day. She denies any dizziness, headaches, diplopia, neck/back pain, dysarthria/dysphagia, bowel/bladder dysfunction.   Epilepsy Risk Factors:  Right MCA stroke. Otherwise she had a normal birth and early development.  There is no history of febrile convulsions, CNS infections such as meningitis/encephalitis, significant traumatic brain injury, neurosurgical procedures, or family history of seizures.  PAST MEDICAL HISTORY: Past Medical History:  Diagnosis Date  . Chicken pox   . Hyperlipidemia   . Hypertension   . Stroke ([place])    Pt had 2 strokes  . Type 2 diabetes mellitus ([place])     MEDICATIONS: Current Outpatient Prescriptions on File Prior to Visit  Medication Sig Dispense Refill  . amLODipine (NORVASC) 10 MG tablet Take 1 tablet (10 mg total) by mouth daily. 90 tablet 3  . aspirin EC 81 MG tablet Take 81 mg by mouth daily.    Marland Kitchen dextromethorphan (DELSYM) 30 MG/5ML liquid 2 tsp every 12 hours as needed    . famotidine (PEPCID) 20 MG tablet One at bedtime 30 tablet 2  . levETIRAcetam (KEPPRA) 1000 MG tablet Take 1 tablet (1,000 mg total) by mouth 2 (two) times daily. 180 tablet 3  . pantoprazole (PROTONIX) 40 MG tablet Take 1 tablet (40 mg total) by mouth daily. Take 30-60 min before first meal of the day 30 tablet 2  . rosuvastatin (CRESTOR) 20  MG tablet Take 1 tablet (20 mg total) by mouth daily. 90 tablet 3   No current facility-administered medications on file prior to visit.     ALLERGIES: Allergies  Allergen Reactions  . Lisinopril Cough  . Vicodin [Hydrocodone-Acetaminophen] Palpitations    FAMILY HISTORY: Family History  Problem Relation Age of Onset  . Pancreatic cancer Mother   . Colon cancer Father   . Hypertension Father   . Heart attack Sister        died around age 78  . Stroke Paternal Grandmother     SOCIAL HISTORY: Social History   Social History  . Marital status: Single    Spouse name: N/A  . Number of children: 1  . Years of education: 62   Occupational History  . disabled    Social History Main Topics  . Smoking status: Former Smoker    Packs/day: 0.25    Years: 10.00    Types: Cigarettes    Quit date: 01/12/2011  . Smokeless tobacco: Never Used  . Alcohol use No  . Drug use: No  . Sexual activity: Not on file  Other Topics Concern  . Not on file   Social History Narrative   Lives alone in a one story home.  Has one daughter.  On disability.  Education: college.     REVIEW OF SYSTEMS: Constitutional: No fevers, chills, or sweats, no generalized fatigue, change in appetite Eyes: No visual changes, double vision, eye pain Ear, nose and throat: No hearing loss, ear pain, nasal congestion, sore throat Cardiovascular: No chest pain, palpitations Respiratory:  No shortness of breath at rest or with exertion, wheezes GastrointestinaI: No nausea, vomiting, diarrhea, abdominal pain, fecal incontinence Genitourinary:  No dysuria, urinary retention or frequency Musculoskeletal:  No neck pain, back pain Integumentary: No rash, pruritus, skin lesions Neurological: as above Psychiatric: No depression, insomnia, anxiety Endocrine: No palpitations, fatigue, diaphoresis, mood swings, change in appetite, change in weight, increased thirst Hematologic/Lymphatic:  No anemia, purpura,  petechiae. Allergic/Immunologic: no itchy/runny eyes, nasal congestion, recent allergic reactions, rashes  PHYSICAL EXAM: Vitals:   11/01/16 1312  BP: 124/68  Pulse: (!) 102  SpO2: 98%   General: No acute distress, flat affect but more expressive today Head:  Normocephalic/atraumatic Neck: supple, no paraspinal tenderness, full range of motion Back: No paraspinal tenderness Heart: regular rate and rhythm Lungs: Clear to auscultation bilaterally. Vascular: No carotid bruits. Skin/Extremities: No rash, no edema, spastic contracture on left hand Neurological Exam: Mental status: alert and oriented to person, place, and time, no dysarthria or aphasia, Fund of knowledge is appropriate.  Recent and remote memory are intact.  Attention and concentration are normal.    Able to name objects and repeat phrases. Cranial nerves: CN I: not tested CN II: pupils equal, round and reactive to light, visual fields intact, fundi unremarkable. CN III, IV, VI:  full range of motion, no nystagmus, no ptosis CN V: facial sensation intact CN VII: shallow left nasolabial fold (similar to prior) CN VIII: hearing intact to finger rub CN IX, X: gag intact, uvula midline CN XI: sternocleidomastoid and trapezius muscles intact CN XII: tongue midline Bulk & Tone: normal, no fasciculations. Motor: 5/5 throughout except for contracture of fingers on left hand, unable to extend fingers, no pronator drift. Sensation: intact to light touch, cold, pin, vibration and joint position sense.  No extinction to double simultaneous stimulation.  Romberg test negative Deep Tendon Reflexes: asymmetric, more brisk +3 on left UE, +2 on right UE and LE, left patella, unable to elicit ankle jerks bilaterally, no ankle clonus Plantar responses: downgoing bilaterally Cerebellar: no incoordination on finger to nose testing Gait: slightly spastic hemiparetic gait with left leg Tremor: none  IMPRESSION: This is a 61 yo LH woman  with a history of hypertension, hyperlipidemia, diabetes, and strokes in March and April 2017. The first stroke involved the right temporal, right middle cerebellar peduncle, and right dorsolateral medulla, stroke was attributed to untreated hypertension. The stroke a week or so later in April 2017 involved the right MCA and was felt to be cardioembolic. Echo normal, per records, loop recorder did not show any evidence of atrial fibrillation. She continues on aspirin 81mg  daily for secondary stroke prevention, BP and lipid control. BP today 124/68. We again discussed control of vascular risk factors and continuing aspirin daily. She was started on Keppra after the second stroke, presumably due to how she was found with saliva on side of mouth and decreased consciousness, EEG showed right frontotemporal slowing, no epileptiform discharges. No seizures or seizure-like symptoms reported, continue Keppra 1000mg  BID. She continues to report a feeling of "not  being whole," it appears to be a lack of confidence, she has anxiety and depression, which could potentially be contributing to these symptoms. She is agreeable to seeing a counselor, referral sent. [place] driving laws were discussed with the patient, and she knows to stop driving after a seizure, until 6 months seizure-free. Since she does not continue to feel 100%, would hold off on driving for now. She has not been driving. She knows to go to the ER immediately for any change in symptoms. She will follow-up in 6 months and knows to call for any changes  Thank you for allowing me to participate in her care.  Please do not hesitate to call for any questions or concerns.  The duration of this appointment visit was 15 minutes of face-to-face time with the patient.  Greater than 50% of this time was spent in counseling, explanation of diagnosis, planning of further management, and coordination of care.   Ellouise Newer, M.D.   CC: Dr. Lorelei Pont

## 2016-11-02 NOTE — Progress Notes (Signed)
LMTCB

## 2016-11-05 ENCOUNTER — Other Ambulatory Visit: Payer: Self-pay | Admitting: Internal Medicine

## 2016-11-05 MED ORDER — MONTELUKAST SODIUM 10 MG PO TABS: 10.0000 mg | ORAL_TABLET | Freq: Every day | ORAL | 0 refills | Status: DC

## 2016-11-05 NOTE — Progress Notes (Signed)
Spoke with pt and notified of results per Dr. Wert. Pt verbalized understanding and denied any questions. 

## 2016-11-08 ENCOUNTER — Telehealth: Payer: Self-pay | Admitting: Internal Medicine

## 2016-11-08 NOTE — Telephone Encounter (Signed)
Spoke with pt, advised her that MW prescribed Singular, Pepcid, and Pantoprazole. I advised he to complete the medications as MW stated in this last note. Pt understood and will continue medications. Nothing further is needed.

## 2016-11-18 ENCOUNTER — Ambulatory Visit (INDEPENDENT_AMBULATORY_CARE_PROVIDER_SITE_OTHER): Payer: BLUE CROSS/BLUE SHIELD | Admitting: *Deleted

## 2016-11-18 DIAGNOSIS — I639 Cerebral infarction, unspecified: Secondary | ICD-10-CM

## 2016-11-19 ENCOUNTER — Ambulatory Visit: Payer: BLUE CROSS/BLUE SHIELD | Admitting: *Deleted

## 2016-11-19 NOTE — Progress Notes (Signed)
Carelink Summary Report / Loop Recorder 
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
## 2016-11-22 ENCOUNTER — Telehealth: Payer: Self-pay | Admitting: Family Medicine

## 2016-11-22 ENCOUNTER — Encounter: Payer: Self-pay | Admitting: Internal Medicine

## 2016-11-22 ENCOUNTER — Ambulatory Visit: Payer: BLUE CROSS/BLUE SHIELD | Admitting: Internal Medicine

## 2016-11-22 ENCOUNTER — Ambulatory Visit: Payer: BLUE CROSS/BLUE SHIELD | Admitting: Family Medicine

## 2016-11-22 VITALS — BP 134/80 | HR 108 | Ht 64.0 in | Wt 179.2 lb

## 2016-11-22 DIAGNOSIS — R05 Cough: Secondary | ICD-10-CM

## 2016-11-22 DIAGNOSIS — J45991 Cough variant asthma: Secondary | ICD-10-CM | POA: Diagnosis not present

## 2016-11-22 DIAGNOSIS — R058 Other specified cough: Secondary | ICD-10-CM

## 2016-11-22 NOTE — Telephone Encounter (Signed)
Patient came in to drop off paper work to be filled out, However patient didn't know she had a appointment at [facility] ?

## 2016-11-22 NOTE — Progress Notes (Signed)
Subjective:     Patient ID: Wendy [name], female   DOB: [birth year],    MRN: [id number]     Brief patient profile:  35 yobf quit smoking 2013 with no resp problems prior then 2016 sudden onset of coughing fit one day  at work in customer service in [place] and coughed "every day since"   Says sometime later placed on lisinopril and stopped July 2017 because the cough on ACEI was much worse and never improved so referred to pulmonary clinic 07/23/2016 by Dr Edilia Bo.     History of Present Illness  07/23/2016 1st [facility] office visit/ Eshaal Duby   Chief Complaint  Patient presents with  . Pulmonary Consult    Referred by Dr. Silvestre Mesi.  Pt c/o cough for the past year- prod with white sputum.  She states that her cough is "all the time" with no specific trigger.   coughs so hard Pos urinary incont and vomiting Maybe a couple of tbsp total per day mucoid sputum, no better with tessalon / zyrtec, atrovent nasal spray  Has not tried any inhalers  cough present 24/7 and interferes with sleep  rec Stop zyrtec and tessalon and atovent  First take delsym two tsp every 12 hours and supplement if needed with  tramadol 50 mg up to 2 every 4 hours to suppress the urge to cough at all or even clear your throat. Swallowing water or using ice chips/non mint and menthol containing candies (such as lifesavers or sugarless jolly ranchers) are also effective.  You should rest your voice and avoid activities that you know make you cough. Once you have eliminated the cough for 3 straight days try reducing the tramadol first,  then the delsym as tolerated.   Prednisone 10 mg take  4 each am x 2 days,   2 each am x 2 days,  1 each am x 2 days and stop (this is to eliminate allergies and inflammation from coughing) Protonix (pantoprazole) Take 30-60 min before first meal of the day and Pepcid 20 mg one bedtime plus chlorpheniramine 4 mg x 2 at bedtime (both available over the counter)  until cough is  completely gone for at least a week without the need for cough suppression GERD diet Allergy profile 07/23/2016 >  Eos 0.1 /  IgE  15 Rast neg - Sinus CT 08/02/2016 >>> 1. Clear paranasal sinus     08/17/2016  f/u ov/Dhruva Orndoff re: refractory cough on ppi qam and zantac bid Chief Complaint  Patient presents with  . Follow-up    Cough not improving much. She is producing some clear sputum. She states she had such a violent cough a few nights ago, she vomitted. She has occ wheezing.   not compliant with recs, never got the chlorpheniramine and rarely used the tramadol in high enough dose/ freq to eliminate the cough but did admit doing some better at hs while on rx as intended but not has some sense of noct "wheeze" as well Very unsure of meds/ names esp between generic and trade names easily confused  rec For drainage / throat tickle try take CHLORPHENIRAMINE  4 mg - take up to one every 4 hours as needed - available over the counter-  May make you sleepy so only take it when you can afford to be sleepy Take delsym two tsp every 12 hours and supplement if needed with  tramadol 50 mg up to 2 every 4 hours to suppress the urge to cough. Swallowing  water or using ice chips/non mint and menthol containing candies (such as lifesavers or sugarless jolly ranchers) are also effective.  You should rest your voice and avoid activities that you know make you cough. Once you have eliminated the cough for 3 straight days try reducing the tramadol first,  then the delsym as tolerated.   Continue pantoprazole Take 30-60 min before first meal of the day and Pepcid 20 mg at bedtime  GERD diet   Call if not improved in a week or two for referral to a throat specialist at [facility] (Dr Joya Gaskins)    10/15/2016  f/u ov/Bryer Cozzolino re: cough  Chief Complaint  Patient presents with  . Acute Visit    Cough is not improving since the last visit. She is coughing until she is vomiting sometimes. Cough is non prod.   never took 2 x h1 at hs   Did improve while on tramadol but did not take as prescribed and got 75% improved until ran out, not clear how much she actually used, did not bring bottles back as req  Did not call for Dr Joya Gaskins eval  Cough to point of vomiting 25/7 now  rec  First take delsym two tsp every 12 hours and supplement if needed with  tramadol 50 mg up to 2 every 4 hours  Once you have eliminated the cough for 3 straight days try reducing the tramadol first,  then the delsym as tolerated.   Prednisone 10 mg take  4 each am x 2 days,   2 each am x 2 days,  1 each am x 2 days and stop (this is to eliminate allergies and inflammation from coughing) Protonix (pantoprazole) Take 30-60 min before first meal of the day and Pepcid 20 mg one bedtime plus chlorpheniramine 4 mg x 2 at bedtime (both available over the counter)  until cough is completely gone for at least a week without the need for cough suppression GERD  Please see patient coordinator before you leave today  to schedule Methacholine challenge test in 2 weeks, not sooner, and if this is negative we will be referring you to Dr Joya Gaskins at [facility]    - MCT 10/25/16  >  POS for reversible airlfow obstruction but denies any assoc symptoms   11/22/2016  f/u ov/Vadie Principato re: cough x 2016 / only taking pepcid prn/ stopped ppi  Chief Complaint  Patient presents with  . Follow-up    Coughing less since the last visit. No new co's today.   new med = singulair not convinced it's helped as much the 1st gen  Tends cough to when swallow saliva Overall better than she was  For last 2 years  Not limited by breathing from desired activities     No obvious day to day or daytime variability or assoc excess/ purulent sputum or mucus plugs or hemoptysis or cp or chest tightness, subjective wheeze or overt sinus or hb symptoms. No unusual exp hx or h/o childhood pna/ asthma or knowledge of premature birth.  Sleeping ok flat without nocturnal  or early am exacerbation  of respiratory   c/o's or need for noct saba. Also denies any obvious fluctuation of symptoms with weather or environmental changes or other aggravating or alleviating factors except as outlined above   Current Allergies, Complete Past Medical History, Past Surgical History, Family History, and Social History were reviewed in [employer] record.  ROS  The following are not active complaints unless bolded Hoarseness, sore  throat, dysphagia, dental problems, itching, sneezing,  nasal congestion or discharge of excess mucus or purulent secretions, ear ache,   fever, chills, sweats, unintended wt loss or wt gain, classically pleuritic or exertional cp,  orthopnea pnd or leg swelling, presyncope, palpitations, abdominal pain, anorexia, nausea, vomiting, diarrhea  or change in bowel habits or change in bladder habits, change in stools or change in urine, dysuria, hematuria,  rash, arthralgias, visual complaints, headache, numbness, weakness or ataxia or problems with walking or coordination,  change in mood/affect or memory.        Current Meds  Medication Sig  . amLODipine (NORVASC) 10 MG tablet Take 1 tablet (10 mg total) by mouth daily.  Marland Kitchen aspirin EC 81 MG tablet Take 81 mg by mouth daily.  Marland Kitchen dextromethorphan (DELSYM) 30 MG/5ML liquid 2 tsp every 12 hours as needed  . levETIRAcetam (KEPPRA) 1000 MG tablet Take 1 tablet (1,000 mg total) by mouth 2 (two) times daily.  . montelukast (SINGULAIR) 10 MG tablet Take 1 tablet (10 mg total) by mouth at bedtime.  . rosuvastatin (CRESTOR) 20 MG tablet Take 1 tablet (20 mg total) by mouth daily.  . [ ]  famotidine (PEPCID) 20 MG tablet One at bedtime (Patient taking differently: One at bedtime as needed)                        Objective:  Physical Exam    amb obese bf  Abn affect persists/ evasive with answers to questions re symptoms  11/22/2016     179 10/15/2016       180   08/17/2016        174   07/23/16 177 lb (80.3 kg)  07/20/16  176 lb (79.8 kg)  06/28/16 169 lb 9.6 oz (76.9 kg)    Vital signs reviewed  - Note on arrival 02 sats  95% on RA        HEENT: nl dentition, turbinates bilaterally, and oropharynx. Nl external ear canals without cough reflex - mild to moderate bilateral non-specific turbinate edema  - upper dentures s/p multiple tooth extractions    NECK :  without JVD/Nodes/TM/ nl carotid upstrokes bilaterally   LUNGS: no acc muscle use,  Nl contour chest which is clear to A and P bilaterally without cough on insp or exp maneuvers   CV:  RRR  no s3 or murmur or increase in P2, and no edema   ABD:  soft and nontender with nl inspiratory excursion in the supine position. No bruits or organomegaly appreciated, bowel sounds nl  MS:  Nl gait/ ext warm without deformities, calf tenderness, cyanosis or clubbing No obvious joint restrictions   SKIN: warm and dry without lesions    NEURO:  alert, approp, nl sensorium with  no motor or cerebellar deficits apparent.           Assessment:

## 2016-11-22 NOTE — Patient Instructions (Addendum)
GERD (REFLUX)  is an extremely common cause of respiratory symptoms just like yours , many times with no obvious heartburn at all.    It can be treated with medication, but also with lifestyle changes including elevation of the head of your bed (ideally with 6 inch  bed blocks),  Smoking cessation, avoidance of late meals, excessive alcohol, and avoid fatty foods, chocolate, peppermint, colas, red wine, and acidic juices such as orange juice.  NO MINT OR MENTHOL PRODUCTS SO NO COUGH DROPS   USE SUGARLESS CANDY INSTEAD (Jolley ranchers or Stover's or Life Savers) or even ice chips will also do - the key is to swallow to prevent all throat clearing. NO OIL BASED VITAMINS - use powdered substitutes.    Leave off the acid suppression for now   Continue singulair  For now and just take the delsym up to 2 tsp every 12 hours as needed for cough   For drainage / throat tickle try take CHLORPHENIRAMINE  4 mg - take one every 4 hours as needed - available over the counter- may cause drowsiness so start with just a bedtime dose or two and see how you tolerate it before trying in daytime     Please schedule a follow up visit in 3 months but call sooner if needed

## 2016-11-22 NOTE — Telephone Encounter (Signed)
No charge. 

## 2016-11-22 NOTE — Progress Notes (Signed)
Carelink Summary Report / Loop Recorder 

## 2016-11-23 ENCOUNTER — Telehealth: Payer: Self-pay | Admitting: *Deleted

## 2016-11-23 NOTE — Telephone Encounter (Signed)
Received [place]AT Application for people with disabilities, completed as much as possible; forwarded to provider/SLS 11/13

## 2016-11-24 ENCOUNTER — Encounter: Payer: Self-pay | Admitting: Internal Medicine

## 2016-11-24 DIAGNOSIS — J45991 Cough variant asthma: Secondary | ICD-10-CM | POA: Insufficient documentation
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
## 2016-11-24 NOTE — Assessment & Plan Note (Signed)
Cough since 2016 made worse by acei use d/c'd in 07/2015  Allergy profile 07/23/2016 >  Eos 0.1 /  IgE  15 Rast neg - Sinus CT 08/02/2016 >>> 1. Clear paranasal sinuses. - cyclical cough protocol 07/23/2016 and 08/17/2016 > non adherent - Spirometry 10/15/2016  Wnl/ very min curvature  - repeat cyclical cough protocol 10/15/2016 with Prednisone 10 mg take  4 each am x 2 days,   2 each am x 2 days,  1 each am x 2 days and stop and schedule MCT on gerd rx x 2 weeks then refer to [place] if still coughing  - MCT 10/25/16  >  POS for reversible airlfow obstruction> started singulair / continued 1st gen H1 blockers per guidelines    She doesn't recall the study provoking any of her reported and quite familiar daily symptoms x 2 years = cough and feels the chlorpheniramine helped more than the singulair or gerd rx but for now since study is pos and she's better than she's been in years rec continue singulair/ prn h1 and stop gerd rx altogether  Warned: NB the  ramp to expected improvement in symptoms from an empiric trial of PPI (and for that matter, worsening, if a chronic effective medication is stopped)  can be measured in weeks, not days, a common misconception because this is not the same as treating heartburn (no immediate cause and effect relationship)  so that response to therapy or lack thereof can be very difficult to assess especially if the patient is not adherent to the treatment plan which includes dietary restrictions.   So rec continue the diet / f/u in 3 m   I had an extended discussion with the patient reviewing all relevant studies completed to date and  lasting 15 to 20 minutes of a 25 minute visit    Each maintenance medication was reviewed in detail including most importantly the difference between maintenance and prns and under what circumstances the prns are to be triggered using an action plan format that is not reflected in the computer generated alphabetically organized AVS.    Please  see AVS for specific instructions unique to this visit that I personally wrote and verbalized to the the pt in detail and then reviewed with pt  by my nurse highlighting any  changes in therapy recommended at today's visit to their plan of care.

## 2016-11-24 NOTE — Assessment & Plan Note (Addendum)
See cough variant asthma

## 2016-11-25 ENCOUNTER — Ambulatory Visit: Payer: BLUE CROSS/BLUE SHIELD | Admitting: Family Medicine

## 2016-11-25 LAB — CUP PACEART REMOTE DEVICE CHECK
Implantable Pulse Generator Implant Date: 20170829
MDC IDC SESS DTM: 20181109010916
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
## 2016-11-29 ENCOUNTER — Encounter: Payer: Self-pay | Admitting: Family Medicine

## 2016-11-29 ENCOUNTER — Ambulatory Visit: Payer: BLUE CROSS/BLUE SHIELD | Admitting: Family Medicine

## 2016-11-29 VITALS — BP 130/72 | HR 88 | Temp 98.2°F | Resp 14 | Wt 182.4 lb

## 2016-11-29 DIAGNOSIS — R945 Abnormal results of liver function studies: Secondary | ICD-10-CM | POA: Diagnosis not present

## 2016-11-29 DIAGNOSIS — R531 Weakness: Secondary | ICD-10-CM

## 2016-11-29 DIAGNOSIS — N839 Noninflammatory disorder of ovary, fallopian tube and broad ligament, unspecified: Secondary | ICD-10-CM | POA: Diagnosis not present

## 2016-11-29 DIAGNOSIS — R569 Unspecified convulsions: Secondary | ICD-10-CM

## 2016-11-29 DIAGNOSIS — N838 Other noninflammatory disorders of ovary, fallopian tube and broad ligament: Secondary | ICD-10-CM

## 2016-11-29 DIAGNOSIS — I10 Essential (primary) hypertension: Secondary | ICD-10-CM | POA: Diagnosis not present

## 2016-11-29 DIAGNOSIS — E119 Type 2 diabetes mellitus without complications: Secondary | ICD-10-CM | POA: Diagnosis not present

## 2016-11-29 DIAGNOSIS — Z8673 Personal history of transient ischemic attack (TIA), and cerebral infarction without residual deficits: Secondary | ICD-10-CM | POA: Diagnosis not present

## 2016-11-29 DIAGNOSIS — Z1211 Encounter for screening for malignant neoplasm of colon: Secondary | ICD-10-CM

## 2016-11-29 DIAGNOSIS — M24542 Contracture, left hand: Secondary | ICD-10-CM | POA: Diagnosis not present

## 2016-11-29 DIAGNOSIS — E785 Hyperlipidemia, unspecified: Secondary | ICD-10-CM | POA: Diagnosis not present

## 2016-11-29 DIAGNOSIS — R7989 Other specified abnormal findings of blood chemistry: Secondary | ICD-10-CM

## 2016-11-29 NOTE — Patient Instructions (Addendum)
It was good to see you today- I hope you have a great thanksgiving! I will refer you to GI and also set up an ultrasound of your ovary/pelvis for you  I will be in touch with your labs asap Please get an eye exam at an eyeglass shop/ eye doctor

## 2016-11-30 LAB — COMPREHENSIVE METABOLIC PANEL
ALBUMIN: 4.4 g/dL (ref 3.5–5.2)
ALK PHOS: 173 U/L — AB (ref 39–117)
ALT: 46 U/L — AB (ref 0–35)
AST: 41 U/L — AB (ref 0–37)
BILIRUBIN TOTAL: 0.5 mg/dL (ref 0.2–1.2)
BUN: 9 mg/dL (ref 6–23)
CALCIUM: 9.9 mg/dL (ref 8.4–10.5)
CO2: 31 meq/L (ref 19–32)
CREATININE: 0.86 mg/dL (ref 0.40–1.20)
Chloride: 100 mEq/L (ref 96–112)
GFR: 85.62 mL/min (ref >60.00)
Glucose, Bld: 143 mg/dL — ABNORMAL HIGH (ref 70–99)
Potassium: 4.9 mEq/L (ref 3.5–5.1)
Sodium: 137 mEq/L (ref 135–145)
Total Protein: 7.1 g/dL (ref 6.0–8.3)

## 2016-11-30 LAB — CBC
HCT: 40.4 % (ref 36.0–46.0)
HEMOGLOBIN: 13.4 g/dL (ref 12.0–15.0)
MCHC: 33.3 g/dL (ref 30.0–36.0)
MCV: 79.3 fl (ref 78.0–100.0)
PLATELETS: 227 10*3/uL (ref 150.0–400.0)
RBC: 5.1 Mil/uL (ref 3.87–5.11)
RDW: 13.4 % (ref 11.5–15.5)
WBC: 8.2 10*3/uL (ref 4.0–10.5)

## 2016-11-30 LAB — MICROALBUMIN / CREATININE URINE RATIO
Creatinine,U: 93.6 mg/dL
MICROALB UR: 0.9 mg/dL (ref 0.0–1.9)
Microalb Creat Ratio: 0.9 mg/g (ref 0.0–30.0)

## 2016-11-30 LAB — HEMOGLOBIN A1C: HEMOGLOBIN A1C: 7.9 % — AB (ref 4.6–6.5)

## 2016-12-01 ENCOUNTER — Telehealth: Payer: Self-pay | Admitting: Family Medicine

## 2016-12-01 NOTE — Telephone Encounter (Signed)
Wendy [name]      Previous Messages    ----- Message -----  From: Darreld Mclean, MD  Sent: 11/25/2016 12:49 PM  To: [name]  Subject: RE: NO SHOW                    No charge  ----- Message -----  From: [name]  Sent: 11/25/2016 12:20 PM  To: Darreld Mclean, MD  Subject: NO SHOW                      Patient called and cancel appointment for 11/25/2016 @ 3:00pm. Charge or No Charge ?

## 2016-12-04 ENCOUNTER — Encounter: Payer: Self-pay | Admitting: Family Medicine

## 2016-12-04 MED ORDER — METFORMIN HCL 500 MG PO TABS: 500.0000 mg | ORAL_TABLET | Freq: Two times a day (BID) | ORAL | 3 refills | Status: DC

## 2016-12-04 NOTE — Addendum Note (Signed)
Addended by: Lamar Blinks C on: 12/04/2016 03:41 PM   Modules accepted: Orders

## 2016-12-19 ENCOUNTER — Other Ambulatory Visit: Payer: Self-pay | Admitting: Family Medicine

## 2016-12-20 ENCOUNTER — Ambulatory Visit (INDEPENDENT_AMBULATORY_CARE_PROVIDER_SITE_OTHER): Payer: BLUE CROSS/BLUE SHIELD | Admitting: *Deleted

## 2016-12-20 DIAGNOSIS — I639 Cerebral infarction, unspecified: Secondary | ICD-10-CM

## 2016-12-20 NOTE — Progress Notes (Signed)
Carelink Summary Report / Loop Recorder 

## 2016-12-27 LAB — CUP PACEART REMOTE DEVICE CHECK
Implantable Pulse Generator Implant Date: 20170829
MDC IDC SESS DTM: 20181209014000

## 2016-12-29 ENCOUNTER — Ambulatory Visit (HOSPITAL_BASED_OUTPATIENT_CLINIC_OR_DEPARTMENT_OTHER): Payer: BLUE CROSS/BLUE SHIELD

## 2016-12-29 ENCOUNTER — Encounter: Payer: Self-pay | Admitting: Internal Medicine

## 2017-01-17 ENCOUNTER — Ambulatory Visit (INDEPENDENT_AMBULATORY_CARE_PROVIDER_SITE_OTHER): Payer: Medicaid Other | Admitting: *Deleted

## 2017-01-17 ENCOUNTER — Ambulatory Visit (INDEPENDENT_AMBULATORY_CARE_PROVIDER_SITE_OTHER): Payer: BLUE CROSS/BLUE SHIELD | Admitting: Internal Medicine

## 2017-01-17 ENCOUNTER — Encounter: Payer: Self-pay | Admitting: Internal Medicine

## 2017-01-17 VITALS — BP 136/64 | HR 92 | Ht 64.0 in | Wt 179.6 lb

## 2017-01-17 DIAGNOSIS — I639 Cerebral infarction, unspecified: Secondary | ICD-10-CM | POA: Diagnosis not present

## 2017-01-17 DIAGNOSIS — R05 Cough: Secondary | ICD-10-CM | POA: Diagnosis not present

## 2017-01-17 DIAGNOSIS — J45991 Cough variant asthma: Secondary | ICD-10-CM

## 2017-01-17 DIAGNOSIS — R058 Other specified cough: Secondary | ICD-10-CM

## 2017-01-17 MED ORDER — MONTELUKAST SODIUM 10 MG PO TABS: 10.0000 mg | ORAL_TABLET | Freq: Every day | ORAL | 11 refills | Status: DC

## 2017-01-17 NOTE — Progress Notes (Signed)
Subjective:     Patient ID: Wendy [name], female   DOB: 1955/06/21,    MRN: [id number]     Brief patient profile:  27 yobf quit smoking 2013 with no resp problems prior then 2016 sudden onset of coughing fit one day  at work in customer service in [place] and coughed "every day since"   Says sometime later placed on lisinopril and stopped July 2017 because the cough on ACEI was much worse and never improved so referred to pulmonary clinic 07/23/2016 by Dr Edilia Bo.     History of Present Illness  07/23/2016 1st [facility] office visit/ Tamana Hatfield   Chief Complaint  Patient presents with  . Pulmonary Consult    Referred by Dr. Silvestre Mesi.  Pt c/o cough for the past year- prod with white sputum.  She states that her cough is "all the time" with no specific trigger.   coughs so hard Pos urinary incont and vomiting Maybe a couple of tbsp total per day mucoid sputum, no better with tessalon / zyrtec, atrovent nasal spray  Has not tried any inhalers  cough present 24/7 and interferes with sleep  rec Stop zyrtec and tessalon and atovent  First take delsym two tsp every 12 hours and supplement if needed with  tramadol 50 mg up to 2 every 4 hours to suppress the urge to cough at all or even clear your throat. Swallowing water or using ice chips/non mint and menthol containing candies (such as lifesavers or sugarless jolly ranchers) are also effective.  You should rest your voice and avoid activities that you know make you cough. Once you have eliminated the cough for 3 straight days try reducing the tramadol first,  then the delsym as tolerated.   Prednisone 10 mg take  4 each am x 2 days,   2 each am x 2 days,  1 each am x 2 days and stop (this is to eliminate allergies and inflammation from coughing) Protonix (pantoprazole) Take 30-60 min before first meal of the day and Pepcid 20 mg one bedtime plus chlorpheniramine 4 mg x 2 at bedtime (both available over the counter)  until cough is  completely gone for at least a week without the need for cough suppression GERD diet Allergy profile 07/23/2016 >  Eos 0.1 /  IgE  15 Rast neg - Sinus CT 08/02/2016 >>> 1. Clear paranasal sinus     08/17/2016  f/u ov/Solstice Lastinger re: refractory cough on ppi qam and zantac bid Chief Complaint  Patient presents with  . Follow-up    Cough not improving much. She is producing some clear sputum. She states she had such a violent cough a few nights ago, she vomitted. She has occ wheezing.   not compliant with recs, never got the chlorpheniramine and rarely used the tramadol in high enough dose/ freq to eliminate the cough but did admit doing some better at hs while on rx as intended but not has some sense of noct "wheeze" as well Very unsure of meds/ names esp between generic and trade names easily confused  rec For drainage / throat tickle try take CHLORPHENIRAMINE  4 mg - take up to one every 4 hours as needed - available over the counter-  May make you sleepy so only take it when you can afford to be sleepy Take delsym two tsp every 12 hours and supplement if needed with  tramadol 50 mg up to 2 every 4 hours to suppress the urge to cough. Swallowing  water or using ice chips/non mint and menthol containing candies (such as lifesavers or sugarless jolly ranchers) are also effective.  You should rest your voice and avoid activities that you know make you cough. Once you have eliminated the cough for 3 straight days try reducing the tramadol first,  then the delsym as tolerated.   Continue pantoprazole Take 30-60 min before first meal of the day and Pepcid 20 mg at bedtime  GERD diet   Call if not improved in a week or two for referral to a throat specialist at [place] (Dr Joya Gaskins)    10/15/2016  f/u ov/Ramondo Dietze re: cough  Chief Complaint  Patient presents with  . Acute Visit    Cough is not improving since the last visit. She is coughing until she is vomiting sometimes. Cough is non prod.   never took 2 x h1 at hs   Did improve while on tramadol but did not take as prescribed and got 75% improved until ran out, not clear how much she actually used, did not bring bottles back as req  Did not call for Dr Joya Gaskins eval  Cough to point of vomiting 25/7 now  rec  First take delsym two tsp every 12 hours and supplement if needed with  tramadol 50 mg up to 2 every 4 hours  Once you have eliminated the cough for 3 straight days try reducing the tramadol first,  then the delsym as tolerated.   Prednisone 10 mg take  4 each am x 2 days,   2 each am x 2 days,  1 each am x 2 days and stop (this is to eliminate allergies and inflammation from coughing) Protonix (pantoprazole) Take 30-60 min before first meal of the day and Pepcid 20 mg one bedtime plus chlorpheniramine 4 mg x 2 at bedtime (both available over the counter)  until cough is completely gone for at least a week without the need for cough suppression GERD  Please see patient coordinator before you leave today  to schedule Methacholine challenge test in 2 weeks, not sooner, and if this is negative we will be referring you to Dr Joya Gaskins at [facility]    - MCT 10/25/16  >  POS for reversible airlfow obstruction but denies any assoc symptoms    11/22/2016  f/u ov/Shelaine Frie re: cough x 2016 / only taking pepcid prn/ stopped ppi  Chief Complaint  Patient presents with  . Follow-up    Coughing less since the last visit. No new co's today.   new med = singulair not convinced it's helped as much the 1st gen  Tends cough to when swallow saliva Overall better than she was  For last 2 years  Not limited by breathing from desired activities   rec GERD diet  Leave off the acid suppression for now  Continue singulair  For now and just take the delsym up to 2 tsp every 12 hours as needed for cough  For drainage / throat tickle try take CHLORPHENIRAMINE  4 mg - take one every 4 hours as needed - available over the counter- may cause drowsiness so start with just a bedtime dose or  two and see how you tolerate it before trying in daytime       01/17/2017  f/u ov/Avraham Benish re: 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 Follow-up    cough with clear mucus  Not limited by breathing from desired activities  But very sedentary   No obvious day to day or daytime variability or assoc purulent sputum or mucus plugs or hemoptysis or cp or chest tightness, subjective wheeze or overt sinus or hb symptoms. No unusual exposure hx or h/o childhood pna/ asthma or knowledge of premature birth.  Sleeping ok flat without nocturnal  or early am exacerbation  of respiratory  c/o's or need for noct saba. Also denies any obvious fluctuation of symptoms with weather or environmental changes or other aggravating or alleviating factors except as outlined above   Current Allergies, Complete Past Medical History, Past Surgical History, Family History, and Social History were reviewed in [employer] record.  ROS  The following are not active complaints unless bolded Hoarseness, sore throat, dysphagia, dental problems, itching, sneezing,  nasal congestion or discharge of excess mucus or purulent secretions, ear ache,   fever, chills, sweats, unintended wt loss or wt gain, classically pleuritic or exertional cp,  orthopnea pnd or leg swelling, presyncope, palpitations, abdominal pain, anorexia, nausea, vomiting, diarrhea  or change in bowel habits or change in bladder habits, change in stools or change in urine, dysuria, hematuria,  rash, arthralgias, visual complaints, headache, numbness, weakness or ataxia or problems with walking or coordination,  change in mood/affect or memory.        Current Meds  Medication Sig  . amLODipine (NORVASC) 10 MG tablet Take 1 tablet (10 mg total) by mouth daily.  Marland Kitchen aspirin EC 81 MG tablet Take 81 mg by mouth daily.  Marland Kitchen dextromethorphan (DELSYM) 30 MG/5ML liquid 2 tsp every  12 hours as needed  . levETIRAcetam (KEPPRA) 1000 MG tablet Take 1 tablet (1,000 mg total) by mouth 2 (two) times daily.  . metFORMIN (GLUCOPHAGE) 500 MG tablet Take 1 tablet (500 mg total) by mouth 2 (two) times daily with a meal.  . montelukast (SINGULAIR) 10 MG tablet Take 1 tablet (10 mg total) by mouth at bedtime.  . rosuvastatin (CRESTOR) 20 MG tablet Take 1 tablet (20 mg total) by mouth daily.  . [DISCONTINUED] montelukast (SINGULAIR) 10 MG tablet Take 1 tablet (10 mg total) by mouth at bedtime.                            Objective:  Physical Exam    amb bf nad / flat affect    01/17/2017         179  11/22/2016     179 10/15/2016       180   08/17/2016        174   07/23/16 177 lb (80.3 kg)  07/20/16 176 lb (79.8 kg)  06/28/16 169 lb 9.6 oz (76.9 kg)      Vital signs reviewed - Note on arrival 02 sats  97% on RA            HEENT: nl   oropharynx. Nl external ear canals without cough reflex - moderate bilateral non-specific turbinate edema  / upper dentures   NECK :  without JVD/Nodes/TM/ nl carotid upstrokes bilaterally   LUNGS: no acc muscle use,  Nl contour chest which is clear to A and P bilaterally without cough on insp or exp maneuvers   CV:  RRR  no s3 or murmur or increase in P2, and no edema   ABD:  soft and nontender with nl inspiratory excursion in the supine position. No bruits or organomegaly  appreciated, bowel sounds nl  MS:  Nl gait/ ext warm without deformities, calf tenderness, cyanosis or clubbing No obvious joint restrictions   SKIN: warm and dry without lesions    NEURO:  alert, approp, nl sensorium with  no motor or cerebellar deficits apparent.            Assessment:

## 2017-01-17 NOTE — Patient Instructions (Addendum)
Singulair 10 mg one daily > resume this as maintenance daily medication    AS NEEDED  >>> For drainage / throat tickle try take CHLORPHENIRAMINE  4 mg - take one every 4 hours as needed - available over the counter- may cause drowsiness so start with just a bedtime dose or two and see how you tolerate it before trying in daytime     Please schedule a follow up visit in 3 months but call sooner if needed  with all medications /inhalers/ solutions in hand so we can verify exactly what you are taking. This includes all medications from all doctors and over the counters  - separate the as needed medications from the ones you use every day

## 2017-01-18 NOTE — Progress Notes (Signed)
Carelink Summary Report / Loop Recorder 

## 2017-01-19 ENCOUNTER — Encounter: Payer: Self-pay | Admitting: Internal Medicine

## 2017-01-19 NOTE — Assessment & Plan Note (Signed)
Cough since 2016 made worse by acei use d/c'd in 07/2015  Allergy profile 07/23/2016 >  Eos 0.1 /  IgE  15 Rast neg - Sinus CT 08/02/2016 >>> 1. Clear paranasal sinuses. - cyclical cough protocol 07/23/2016 and 08/17/2016 > non adherent - Spirometry 10/15/2016  Wnl/ very min curvature  - repeat cyclical cough protocol 10/15/2016 with Prednisone 10 mg take  4 each am x 2 days,   2 each am x 2 days,  1 each am x 2 days and stop and schedule MCT on gerd rx x 2 weeks then refer to [place] if still coughing  - MCT 10/25/16  >  POS for reversible airlfow obstruction s symptoms> started singulair / continued 1st gen H1 blockers per guidelines     Each maintenance medication was reviewed in detail including most importantly the difference between maintenance and as needed and under what circumstances the prns are to be used.  Please see AVS for specific  Instructions which are unique to this visit and I personally typed out  which were reviewed in detail in writing with the patient and a copy provided.

## 2017-01-19 NOTE — Assessment & Plan Note (Signed)
Cough since 2016 made worse by acei use d/c'd in 07/2015  Allergy profile 07/23/2016 >  Eos 0.1 /  IgE  15 Rast neg - Sinus CT 08/02/2016 >>> 1. Clear paranasal sinuses. - cyclical cough protocol 07/23/2016 and 08/17/2016 > non adherent - Spirometry 10/15/2016  Wnl/ very min curvature  - repeat cyclical cough protocol 10/15/2016 with Prednisone 10 mg take  4 each am x 2 days,   2 each am x 2 days,  1 each am x 2 days and stop and schedule MCT on gerd rx x 2 weeks then refer to [place] if still coughing - MCT 10/25/16  >  POS for reversible airlfow obstruction but no assoc symptoms> started singulair / continued 1st gen H1 blockers per guidelines > improved while of rx   singulair may be helping with both rhinitis/pnds and asthma component > refilled

## 2017-01-24 ENCOUNTER — Telehealth: Payer: Self-pay | Admitting: Family Medicine

## 2017-01-24 DIAGNOSIS — Z8673 Personal history of transient ischemic attack (TIA), and cerebral infarction without residual deficits: Secondary | ICD-10-CM

## 2017-01-24 NOTE — Telephone Encounter (Signed)
Copied from [place] [phone]. Topic: Quick Communication - See Telephone Encounter >> Jan 24, 2017  3:38 PM Antonieta Iba C wrote: CRM for notification. See Telephone encounter for: pt called in, she is requesting home health assistance for Cleaning and Cooking. ALSO, pt says that she is border line diabetic, she would like to know if PCP could set her up with a referral to a location that could help her with clipping her toe nails.   Pt would like further assistance.    CB: [phone]   01/24/17.

## 2017-01-26 NOTE — Telephone Encounter (Signed)
Called and [facility]- I referred her to podiatry. I don't think HH will do cooking and cleaning- she may need a private home aid for these services. If she needs a referral to an agency of this type let me know

## 2017-01-27 ENCOUNTER — Telehealth: Payer: Self-pay | Admitting: Internal Medicine

## 2017-01-27 NOTE — Telephone Encounter (Signed)
lmtcb for pt.  

## 2017-01-28 NOTE — Telephone Encounter (Signed)
lmtcb X2 for pt.  

## 2017-01-29 LAB — CUP PACEART REMOTE DEVICE CHECK
Implantable Pulse Generator Implant Date: 20170829
MDC IDC SESS DTM: 20190108023702

## 2017-01-31 NOTE — Telephone Encounter (Signed)
Strongly prefer ov with all meds in hand - should be able to see her today with all the no shows we've had - ok to add on to the afternoon

## 2017-01-31 NOTE — Telephone Encounter (Signed)
Spoke with patient. She stated that she is still having a deep, violent, nonproductive cough even after restarting the Singulair. Restarted the Singulair after her 01/17/17 visit. States she had the cough well before that appointment. Denies any other symptoms beside the cough.   She wants to know if MW would call in a round of Prednisone for her. She wishes to use [facility] on [address].   MW, please advise. Thanks!

## 2017-01-31 NOTE — Telephone Encounter (Signed)
Spoke with patient. She declined an appt since she was just here on 1/7 and does not want to pay another $75.   Nothing else needed at time of call.

## 2017-02-08 ENCOUNTER — Ambulatory Visit (AMBULATORY_SURGERY_CENTER): Payer: Self-pay

## 2017-02-08 VITALS — Ht 64.25 in | Wt 177.6 lb

## 2017-02-08 DIAGNOSIS — Z800 Family history of malignant neoplasm of digestive organs: Secondary | ICD-10-CM

## 2017-02-08 MED ORDER — NA SULFATE-K SULFATE-MG SULF 17.5-3.13-1.6 GM/177ML PO SOLN: 1.0000 | Freq: Once | ORAL | 0 refills | Status: AC

## 2017-02-08 NOTE — Progress Notes (Signed)
Per pt, no allergies to soy or egg products.Pt not taking any weight loss meds or using  O2 at home.  Emmi video sent to patient's email 

## 2017-02-14 ENCOUNTER — Other Ambulatory Visit: Payer: Self-pay | Admitting: *Deleted

## 2017-02-14 ENCOUNTER — Telehealth: Payer: Self-pay | Admitting: Family Medicine

## 2017-02-14 DIAGNOSIS — I10 Essential (primary) hypertension: Secondary | ICD-10-CM

## 2017-02-14 MED ORDER — AMLODIPINE BESYLATE 10 MG PO TABS: 10.0000 mg | ORAL_TABLET | Freq: Every day | ORAL | 3 refills | Status: DC

## 2017-02-14 NOTE — Telephone Encounter (Signed)
Patient was seen in office 11/29/2016. Problem in review and labs drawn. Rx refilled per protocol.  Copied from [place] [phone]. Topic: Quick Communication - Rx Refill/Question >> Feb 14, 2017 10:03 AM Lolita Rieger, RMA wrote: Medication: amlodipine 10 mg   Has the patient contacted their pharmacy? yes   (Agent: If no, request that the patient contact the pharmacy for the refill.)   Preferred Pharmacy (with phone number or street name): [facility] [address]: Please be advised that RX refills may take up to 3 business days. We ask that you follow-up with your pharmacy.

## 2017-02-15 MED ORDER — AMLODIPINE BESYLATE 10 MG PO TABS: 10.0000 mg | ORAL_TABLET | Freq: Every day | ORAL | 3 refills | Status: DC

## 2017-02-16 ENCOUNTER — Ambulatory Visit (INDEPENDENT_AMBULATORY_CARE_PROVIDER_SITE_OTHER): Payer: BLUE CROSS/BLUE SHIELD | Admitting: *Deleted

## 2017-02-16 DIAGNOSIS — I639 Cerebral infarction, unspecified: Secondary | ICD-10-CM | POA: Diagnosis not present

## 2017-02-17 NOTE — Progress Notes (Signed)
Carelink Summary Report / Loop Recorder 

## 2017-02-22 ENCOUNTER — Encounter: Payer: BLUE CROSS/BLUE SHIELD | Admitting: Internal Medicine

## 2017-02-28 ENCOUNTER — Ambulatory Visit: Payer: BLUE CROSS/BLUE SHIELD | Admitting: Internal Medicine

## 2017-03-02 ENCOUNTER — Encounter: Payer: Medicaid Other | Admitting: Internal Medicine

## 2017-03-03 ENCOUNTER — Encounter: Payer: Self-pay | Admitting: Family Medicine

## 2017-03-03 ENCOUNTER — Telehealth: Payer: Self-pay

## 2017-03-03 DIAGNOSIS — Z9109 Other allergy status, other than to drugs and biological substances: Secondary | ICD-10-CM

## 2017-03-03 NOTE — Telephone Encounter (Signed)
Please advise 

## 2017-03-03 NOTE — Telephone Encounter (Signed)
Copied from [place]. Topic: Referral - Question >> Mar 03, 2017 11:14 AM Oliver Pila B wrote: Reason for CRM: pt called to get a referral to see someone for her allergies, contact pt to advise

## 2017-03-11 LAB — CUP PACEART REMOTE DEVICE CHECK
Implantable Pulse Generator Implant Date: 20170829
MDC IDC SESS DTM: 20190207030553

## 2017-03-21 ENCOUNTER — Ambulatory Visit (INDEPENDENT_AMBULATORY_CARE_PROVIDER_SITE_OTHER): Payer: BLUE CROSS/BLUE SHIELD | Admitting: *Deleted

## 2017-03-21 ENCOUNTER — Encounter: Payer: Self-pay | Admitting: Internal Medicine

## 2017-03-21 DIAGNOSIS — I639 Cerebral infarction, unspecified: Secondary | ICD-10-CM

## 2017-03-22 NOTE — Progress Notes (Signed)
Carelink Summary Report / Loop Recorder 

## 2017-03-24 ENCOUNTER — Telehealth: Payer: Self-pay

## 2017-03-24 DIAGNOSIS — K219 Gastro-esophageal reflux disease without esophagitis: Secondary | ICD-10-CM

## 2017-03-24 NOTE — Telephone Encounter (Signed)
Ok will place order

## 2017-03-24 NOTE — Telephone Encounter (Signed)
Copied from [place] [id number]. Topic: Referral - Request >> Mar 24, 2017 10:04 AM Yvette Rack wrote: Reason for CRM: patient stating that Dr Melvyn Novas wanted Dr Lorelei Pont to send out a referral to a GI doctor for GERD

## 2017-03-25 ENCOUNTER — Encounter: Payer: Self-pay | Admitting: Gastroenterology
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
## 2017-04-06 ENCOUNTER — Ambulatory Visit (INDEPENDENT_AMBULATORY_CARE_PROVIDER_SITE_OTHER): Payer: BLUE CROSS/BLUE SHIELD | Admitting: Family Medicine

## 2017-04-06 ENCOUNTER — Encounter: Payer: Self-pay | Admitting: Family Medicine

## 2017-04-06 VITALS — BP 136/74 | HR 87 | Resp 16 | Ht 64.0 in | Wt 176.8 lb

## 2017-04-06 DIAGNOSIS — Z8673 Personal history of transient ischemic attack (TIA), and cerebral infarction without residual deficits: Secondary | ICD-10-CM | POA: Diagnosis not present

## 2017-04-06 DIAGNOSIS — E119 Type 2 diabetes mellitus without complications: Secondary | ICD-10-CM | POA: Diagnosis not present

## 2017-04-06 NOTE — Patient Instructions (Addendum)
It sounds like you may need a home aid to assist with your medications and wellfare There are several agencies that offer this in town - one is  [facility] [place] [address] [phone] ([phone])   Here is a list of other [facility] practices that are closer to your home - [facility] [place] . [place], [place]: [phone] . Fax: [phone]   [facility] [address] . [place], [place]: [phone] . Behavioral Medicine: [phone] . Fax: [facility] [place] [place] . [place], [place]: [phone] . Behavioral Medicine: [phone] . Fax: [phone]    Please schedule an eye visit as soon as you are able   Please call [facility] at [phone]. Ask them to reschedule your right upper quadrant ultrasound and pelvic ultrasound that were ordered in November

## 2017-04-07 LAB — HEMOGLOBIN A1C: HEMOGLOBIN A1C: 7.6 % — AB (ref 4.6–6.5)

## 2017-04-08 ENCOUNTER — Encounter: Payer: Self-pay | Admitting: Family Medicine

## 2017-04-08 MED ORDER — METFORMIN HCL 1000 MG PO TABS
1000.0000 mg | ORAL_TABLET | Freq: Two times a day (BID) | ORAL | 3 refills | Status: DC
Start: 2017-04-08 — End: 2018-06-23

## 2017-04-08 NOTE — Addendum Note (Signed)
Addended by: Lamar Blinks C on: 04/08/2017 12:53 PM   Modules accepted: Orders

## 2017-04-18 ENCOUNTER — Encounter: Payer: Self-pay | Admitting: Internal Medicine

## 2017-04-18 ENCOUNTER — Telehealth: Payer: Self-pay

## 2017-04-18 ENCOUNTER — Telehealth: Payer: Self-pay | Admitting: Family Medicine

## 2017-04-18 ENCOUNTER — Ambulatory Visit (INDEPENDENT_AMBULATORY_CARE_PROVIDER_SITE_OTHER): Payer: BLUE CROSS/BLUE SHIELD | Admitting: Internal Medicine

## 2017-04-18 VITALS — BP 130/80 | HR 94 | Ht 64.0 in | Wt 170.0 lb

## 2017-04-18 DIAGNOSIS — J45991 Cough variant asthma: Secondary | ICD-10-CM

## 2017-04-18 DIAGNOSIS — Z8673 Personal history of transient ischemic attack (TIA), and cerebral infarction without residual deficits: Secondary | ICD-10-CM

## 2017-04-18 DIAGNOSIS — R531 Weakness: Secondary | ICD-10-CM

## 2017-04-18 MED ORDER — PREDNISONE 10 MG PO TABS: ORAL_TABLET | ORAL | 0 refills | Status: DC

## 2017-04-18 NOTE — Patient Instructions (Addendum)
Prednisone 10 mg take  4 each am x 2 days,   2 each am x 2 days,  1 each am x 2 days and stop   Singulair 10 mg one daily > resume this as maintenance daily medication    AS NEEDED  >>> For drainage / throat tickle try take CHLORPHENIRAMINE  4 mg - take one every 4 hours as needed - available over the counter- may cause drowsiness so start with just a bedtime dose or two and see how you tolerate it before trying in daytime     Diagnosis: cough variant asthma with upper airway cough syndrome    Please schedule a follow up visit in 3 months but call sooner if needed  with all medications /inhalers/ solutions in hand so we can verify exactly what you are taking. This includes all medications from all doctors and over the counters  - separate the as needed medications from the ones you use every day

## 2017-04-18 NOTE — Assessment & Plan Note (Addendum)
Cough since 2016 made worse by acei use d/c'd in 07/2015  Allergy profile 07/23/2016 >  Eos 0.1 /  IgE  15 Rast neg - Sinus CT 08/02/2016 > Clear paranasal sinuses. - cyclical cough protocol 07/23/2016 and 08/17/2016 > non adherent - Spirometry 10/15/2016  Wnl/ very min curvature  - repeat cyclical cough protocol 10/15/2016 with Prednisone 10 mg take  4 each am x 2 days,   2 each am x 2 days,  1 each am x 2 days and stop and schedule MCT on gerd rx x 2 weeks then refer to [facility] if still coughing  - MCT 10/25/16  >  POS for reversible airlfow obstruction s symptoms> started singulair / continued 1st gen H1 blockers per guidelines     DDX of  difficult airways management almost all start with A and  include Adherence, Ace Inhibitors, Acid Reflux, Active Sinus Disease, Alpha 1 Antitripsin deficiency, Anxiety masquerading as Airways dz,  ABPA,  Allergy(esp in young), Aspiration (esp in elderly), Adverse effects of meds,  Active smokers, A bunch of PE's (a small clot burden can't cause this syndrome unless there is already severe underlying pulm or vascular dz with poor reserve) plus two Bs  = Bronchiectasis and Beta blocker use..and one C= CHF   Adherence is always the initial "prime suspect" and is a multilayered concern that requires a "trust but verify" approach in every patient - starting with knowing how to use medications, especially inhalers, correctly, keeping up with refills and understanding the fundamental difference between maintenance and prns vs those medications only taken for a very short course and then stopped and not refilled.  - did not bring in meds as rec - needs to return with all meds in hand using a trust but verify approach to confirm accurate Medication  Reconciliation The principal here is that until we are certain that the  patients are doing what we've asked, it makes no sense to ask them to do more.   ? Allergy > Prednisone 10 mg take  4 each am x 2 days,   2 each am x 2 days,  1  each am x 2 days and stop and restart singulair as maint rx   ? Active sinus dz/ rhinitis > try again:  1st gen H1 blockers per guidelines      ? Anxiety/depression  > usually at the bottom of this list of usual suspects but should be much higher on this pt's based on H and P and note already on psychotropics and may interfere with adherence and also interpretation of response or lack thereof to symptom management which can be quite subjective.    The standardized cough guidelines published in Chest by Lissa Morales in 2006 are still the best available and consist of a multiple step process (up to 12!) , not a single office visit,  and are intended  to address this problem logically,  with an alogrithm dependent on response to empiric treatment at  each progressive step  to determine a specific diagnosis with  minimal addtional testing needed. Therefore if adherence is an issue or can't be accurately verified,  it's very unlikely the standard evaluation and treatment will be successful here.    Furthermore, response to therapy (other than acute cough suppression, which should only be used short term with avoidance of narcotic containing cough syrups if possible), can be a gradual process for which the patient is not likely to  perceive immediate benefit.  Unlike going  to an eye doctor where the best perscription is almost always the first one and is immediately effective, this is almost never the case in the management of chronic cough syndromes. Therefore the patient needs to commit up front to consistently adhere to recommendations  for up to 6 weeks of therapy directed at the likely underlying problem(s) before the response can be reasonably evaluated.    I had an extended discussion with the patient reviewing all relevant studies completed to date and  lasting 25 minutes of a 40  minute extended office  visit addressing severe refactory  non-specific but potentially very serious respiratory  symptoms of uncertain and potentially multiple  etiologies.   Each maintenance medication was reviewed in detail including most importantly the difference between maintenance and prns and under what circumstances the prns are to be triggered using an action plan format that is not reflected in the computer generated alphabetically organized AVS.    Please see AVS for specific instructions unique to this office visit that I personally wrote and verbalized to the the pt in detail and then reviewed with pt  by my nurse highlighting any changes in therapy/plan of care  recommended at today's visit.

## 2017-04-18 NOTE — Telephone Encounter (Signed)
Pt requesting to transfer to [facility] office- can anyone assist in who is accepting new patients?

## 2017-04-18 NOTE — Telephone Encounter (Signed)
Ok to transfer on my end

## 2017-04-18 NOTE — Telephone Encounter (Signed)
Please advise 

## 2017-04-18 NOTE — Progress Notes (Signed)
Subjective:     Patient ID: Wendy [name], female   DOB: 11-08-1955,    MRN: [id number]     Brief patient profile:  35 yobf quit smoking 2013 with no resp problems prior then 2016 sudden onset of coughing fit one day  at work in customer service in [place] and coughed "every day since"   Says sometime later placed on lisinopril and stopped July 2017 because the cough on ACEI was much worse and never improved so referred to pulmonary clinic 07/23/2016 by Dr Edilia Bo.     History of Present Illness  07/23/2016 1st [facility] office visit/ Wendy [name]   Chief Complaint  Patient presents with  . Pulmonary Consult    Referred by Dr. Silvestre Mesi.  Pt c/o cough for the past year- prod with white sputum.  She states that her cough is "all the time" with no specific trigger.   coughs so hard Pos urinary incont and vomiting Maybe a couple of tbsp total per day mucoid sputum, no better with tessalon / zyrtec, atrovent nasal spray  Has not tried any inhalers  cough present 24/7 and interferes with sleep  rec Stop zyrtec and tessalon and atovent  First take delsym two tsp every 12 hours and supplement if needed with  tramadol 50 mg up to 2 every 4 hours to suppress the urge to cough at all or even clear your throat.  Prednisone 10 mg take  4 each am x 2 days,   2 each am x 2 days,  1 each am x 2 days and stop (this is to eliminate allergies and inflammation from coughing) Protonix (pantoprazole) Take 30-60 min before first meal of the day and Pepcid 20 mg one bedtime plus chlorpheniramine 4 mg x 2 at bedtime (both available over the counter)  until cough is completely gone for at least a week without the need for cough suppression GERD diet Allergy profile 07/23/2016 >  Eos 0.1 /  IgE  15 Rast neg - Sinus CT 08/02/2016 >>> 1. Clear paranasal sinus     08/17/2016  f/u ov/Wendy [name] re: refractory cough on ppi qam and zantac bid Chief Complaint  Patient presents with  . Follow-up    Cough not improving  much. She is producing some clear sputum. She states she had such a violent cough a few nights ago, she vomitted. She has occ wheezing.   not compliant with recs, never got the chlorpheniramine and rarely used the tramadol in high enough dose/ freq to eliminate the cough but did admit doing some better at hs while on rx as intended but not has some sense of noct "wheeze" as well Very unsure of meds/ names esp between generic and trade names easily confused  rec For drainage / throat tickle try take CHLORPHENIRAMINE  4 mg - take up to one every 4 hours as needed - available over the counter-  May make you sleepy so only take it when you can afford to be sleepy Take delsym two tsp every 12 hours and supplement if needed with  tramadol 50 mg up to 2 every 4 hours to suppress the urge to cough. .   Continue pantoprazole Take 30-60 min before first meal of the day and Pepcid 20 mg at bedtime  GERD diet   Call if not improved in a week or two for referral to a throat specialist at [facility] (Dr Wendy [name])      10/15/2016  f/u ov/Wendy [name] re: cough  Chief Complaint  Patient presents with  . Acute Visit    Cough is not improving since the last visit. She is coughing until she is vomiting sometimes. Cough is non prod.   never took 2 x h1 at hs  Did improve while on tramadol but did not take as prescribed and got 75% improved until ran out, not clear how much she actually used, did not bring bottles back as req  Did not call for Dr Wendy [name] eval  Cough to point of vomiting 25/7 now  rec  First take delsym two tsp every 12 hours and supplement if needed with  tramadol 50 mg up to 2 every 4 hours  Once you have eliminated the cough for 3 straight days try reducing the tramadol first,  then the delsym as tolerated.   Prednisone 10 mg take  4 each am x 2 days,   2 each am x 2 days,  1 each am x 2 days and stop (this is to eliminate allergies and inflammation from coughing) Protonix (pantoprazole) Take 30-60 min before  first meal of the day and Pepcid 20 mg one bedtime plus chlorpheniramine 4 mg x 2 at bedtime (both available over the counter)  until cough is completely gone for at least a week without the need for cough suppression GERD  Please see patient coordinator before you leave today  to schedule Methacholine challenge test in 2 weeks, not sooner, and if this is negative we will be referring you to Dr Wendy [name] at [facility]    - MCT 10/25/16  >  POS for reversible airlfow obstruction but denies any assoc symptoms    11/22/2016  f/u ov/Wendy [name] re: cough x 2016 / only taking pepcid prn/ stopped ppi  Chief Complaint  Patient presents with  . Follow-up    Coughing less since the last visit. No new co's today.   new med = singulair not convinced it's helped as much the 1st gen  Tends cough to when swallow saliva Overall better than she was  For last 2 years  Not limited by breathing from desired activities   rec GERD diet  Leave off the acid suppression for now  Continue singulair  For now and just take the delsym up to 2 tsp every 12 hours as needed for cough  For drainage / throat tickle try take CHLORPHENIRAMINE  4 mg - take one every 4 hours as needed - available over the counter- may cause drowsiness so start with just a bedtime dose or two and see how you tolerate it before trying in daytime       01/17/2017  f/u ov/Wendy [name] re: 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 Follow-up    cough with clear mucus  Singulair 10 mg one daily > resume this as maintenance daily medication  AS NEEDED  >>> For drainage / throat tickle try take CHLORPHENIRAMINE  4 mg - take one every 4 hours as needed - available over the counter- may cause drowsiness so start with just a bedtime dose or two and see how you tolerate it before trying in daytime   Please schedule a follow up visit in 3 months but call sooner if needed  with all medications /inhalers/  solutions in hand so we can verify exactly what you are taking. This includes all medications from all doctors and over the counters  - separate the as needed medications from  the ones you use every day    04/18/2017  f/u ov/Gerren Hoffmeier re:  Cough variant asthma / did not fill rx for singulair/ pred helps the most / did not bring any meds  Chief Complaint  Patient presents with  . Follow-up    states she still has cough and has not seen much improvement.   Dyspnea:  Not limited by breathing from desired activities  And cough no worse with ex  Cough:  No pattern/ has cough even while sleeping/ non prod Sleep: disrupted by cough  SABA use:  None  No obvious patterns in  day to day or daytime variability or assoc excess/ purulent sputum or mucus plugs or hemoptysis or cp or chest tightness, subjective wheeze or overt sinus or hb symptoms. No unusual exposure hx or h/o childhood pna/ asthma or knowledge of premature birth.   . Also denies any obvious fluctuation of symptoms with weather or environmental changes or other aggravating or alleviating factors except as outlined above   Current Allergies, Complete Past Medical History, Past Surgical History, Family History, and Social History were reviewed in [facility] record.  ROS  The following are not active complaints unless bolded Hoarseness, sore throat, dysphagia, dental problems, itching, sneezing,  nasal congestion or discharge of excess mucus or purulent secretions, ear ache,   fever, chills, sweats, unintended wt loss or wt gain, classically pleuritic or exertional cp,  orthopnea pnd or leg swelling, presyncope, palpitations, abdominal pain, anorexia, nausea, vomiting, diarrhea  or change in bowel habits or change in bladder habits, change in stools or change in urine, dysuria, hematuria,  rash, arthralgias, visual complaints, headache, numbness, weakness or ataxia or problems with walking or coordination,  change in mood/affect  or memory.        Current Meds  Medication Sig  . amLODipine (NORVASC) 10 MG tablet Take 1 tablet (10 mg total) by mouth daily.  Marland Kitchen amLODipine (NORVASC) 10 MG tablet Take 1 tablet (10 mg total) by mouth daily.  Marland Kitchen aspirin EC 81 MG tablet Take 81 mg by mouth daily.  Marland Kitchen levETIRAcetam (KEPPRA) 1000 MG tablet Take 1 tablet (1,000 mg total) by mouth 2 (two) times daily.  . metFORMIN (GLUCOPHAGE) 1000 MG tablet Take 1 tablet (1,000 mg total) by mouth 2 (two) times daily with a meal.  . montelukast (SINGULAIR) 10 MG tablet Take 1 tablet (10 mg total) by mouth at bedtime.  . rosuvastatin (CRESTOR) 20 MG tablet Take 1 tablet (20 mg total) by mouth daily.                    Objective:  Physical Exam   amb bf very somber nad/ never coughed once    04/18/2017         170  01/17/2017         179  11/22/2016     179 10/15/2016       180   08/17/2016        174   07/23/16 177 lb (80.3 kg)  07/20/16 176 lb (79.8 kg)  06/28/16 169 lb 9.6 oz (76.9 kg)     Vital signs reviewed - Note on arrival 02 sats  95% on RA    HEENT: nl dentition,   and oropharynx. Nl external ear canals without cough reflex-Turbinate edema/ mucoid secretions/ oropharanx pristine   NECK :  without JVD/Nodes/TM/ nl carotid upstrokes bilaterally   LUNGS: no acc muscle use,  Nl contour chest  Minimal insp/exp  rhonchi mostly upper airway without cough on insp or exp maneuvers   CV:  RRR  no s3 or murmur or increase in P2, and no edema   ABD:  soft and nontender with nl inspiratory excursion in the supine position. No bruits or organomegaly appreciated, bowel sounds nl  MS:  Nl gait/ ext warm without deformities, calf tenderness, cyanosis or clubbing No obvious joint restrictions   SKIN: warm and dry without lesions    NEURO:  alert, approp, nl sensorium with  no motor or cerebellar deficits apparent.             Assessment:

## 2017-04-18 NOTE — Telephone Encounter (Signed)
Copied from [place] [id number]. Topic: Quick Communication - See Telephone Encounter >> Apr 18, 2017  1:26 PM Synthia Innocent wrote: CRM for notification. See Telephone encounter for: 04/18/17. Patient would like to know if Dr Lorelei Pont believes patient will benefit from a procedure/surgery for restoring use of left arm.

## 2017-04-18 NOTE — Telephone Encounter (Signed)
Copied from [place] [id number]. Topic: General - Other >> Apr 18, 2017  3:19 PM Wendy [name] B wrote:  Pt would like to transfer care from Dr Edilia Bo to a provider at the [facility] office

## 2017-04-19 ENCOUNTER — Other Ambulatory Visit: Payer: Self-pay | Admitting: Family Medicine

## 2017-04-19 DIAGNOSIS — R945 Abnormal results of liver function studies: Principal | ICD-10-CM

## 2017-04-19 DIAGNOSIS — R7989 Other specified abnormal findings of blood chemistry: Secondary | ICD-10-CM

## 2017-04-19 DIAGNOSIS — N83201 Unspecified ovarian cyst, right side: Secondary | ICD-10-CM

## 2017-04-19 NOTE — Progress Notes (Signed)
We had ordered a RUQ [place] for elevated LFTs US pelvis and trasnsvag for pt reported history of right ovarian cyst vs mass.  Need to change orders to [facility] for pt- will do so now Placed new orders for new imaging location as requested

## 2017-04-19 NOTE — Telephone Encounter (Signed)
Called her back- she has questions about an experimental stem cell procedure being done at [facility] for ?her type of condition.  Advised that I don't know enough about this to really advise her on this issue but will support her in any way that I can She is interested in continuing PT to work on her left arm weakness- will order this for her

## 2017-04-22 ENCOUNTER — Encounter: Payer: Self-pay | Admitting: Neurology

## 2017-04-22 ENCOUNTER — Ambulatory Visit (INDEPENDENT_AMBULATORY_CARE_PROVIDER_SITE_OTHER): Payer: BLUE CROSS/BLUE SHIELD | Admitting: Neurology

## 2017-04-22 ENCOUNTER — Telehealth: Payer: Self-pay | Admitting: Family Medicine

## 2017-04-22 VITALS — BP 108/70 | HR 78 | Ht 64.0 in | Wt 174.0 lb

## 2017-04-22 DIAGNOSIS — G40209 Localization-related (focal) (partial) symptomatic epilepsy and epileptic syndromes with complex partial seizures, not intractable, without status epilepticus: Secondary | ICD-10-CM

## 2017-04-22 DIAGNOSIS — R413 Other amnesia: Secondary | ICD-10-CM | POA: Diagnosis not present

## 2017-04-22 DIAGNOSIS — Z8673 Personal history of transient ischemic attack (TIA), and cerebral infarction without residual deficits: Secondary | ICD-10-CM

## 2017-04-22 NOTE — Telephone Encounter (Signed)
Copied from [place] [phone]. Topic: Referral - Status >> Apr 22, 2017  2:14 PM Boyd Kerbs wrote: Reason for CRM:   pt. Called and wants to have referral sent to [facility].  They do not show anything.

## 2017-04-22 NOTE — Progress Notes (Signed)
NEUROLOGY FOLLOW UP OFFICE NOTE  Jameria Bradway [id number] 11-Dec-1955  HISTORY OF PRESENT ILLNESS: I had the pleasure of seeing Wendy [name] in follow-up in the neurology clinic on 04/22/2017.  The patient was last seen 6 months ago for recurrent stroke and possible seizure. She is alone in the office today. Since her last visit, she denies any further episodes of decreased awareness since April 2017. 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 She has had a loop recorder since August 2017 with no significant events noted. She lives alone and denies any further episodes of decreased responsiveness. She has been taking Keppra 1000mg  BID without side effects. She takes aspirin regularly.   On her last visit, she reported feeling like she is "not whole" in her head. She again reports the same symptoms, and when asked to elucidate further, she states it feels like there is a void in her head, and she is not whole because she does not have use of her left arm and hand. She is getting ready to go back to school and is frustrated she cannot write with her left hand anymore. She feels she is forgetting more. Yesterday she could not find her jacket, she had put tax documents in the pocket and recalls walking home from the store with her jacket, but could not find it in her house or walking back her route. She manages her own medications and denies missing doses. Her sister manages bills. There is a fogginess present all the time. Her walking is worse, she does not feel balanced. She denies any focal numbness/tingling, no falls. She denies any headaches, diplopia, speech difficulties or confusion.   HPI 07/30/2016: This is a 62 yo LH woman with a history of hypertension, hyperlipidemia, diabetes, and strokes in March and April 2017. She recalls the first stroke happened at work in March 2017, she started feeling badly, vision was impaired, she felt feverish and dizzy. Her daughter brought her to the ER and  she was admitted for a week. There is one Neurology note from [place], [place] on EPIC, she had an elevated BP of 240/119 on arrival to ER, right facial droop, left hemisensory deficits, dysarthria and dysphagia. MRI brain had shown acute infarcts in the right temporal, right middle cerebellar peduncle, and right dorsolateral medulla. Stroke was attributed to untreated hypertension and she was discharged home on aspirin with no residual deficits. She reports that a week or so later, she was back to the hospital in April, she recalls sliding off the bed with her left side paralyzed, unable to answer the doorbell. Her daughter found her on the floor with saliva coming out of the left side of her mouth. She reports she was awake at this time. 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 She had left hemiparesis, decreased consciousness, left hemisensory deficits, extinction to double simultaneous stimulation. She was out of the window for TPA and required a PEG briefly for dysphagia. She had a loop recorder in August 2017 with no episodes of atrial fibrillation. Echo showed EF of 70%, no PFO. She continues on a daily baby aspirin. 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 She denies any seizures, presumably due to decreased consciousness when she was found on the ground, she was started on Keppra 1000mg  BID. She had prolonged EEG in [place] while admitted for the second stroke, no seizure activity seen, there was focal right  temporal theta/delta slowing and diffuse slowing of the background.   She moved to [facility] in January 2018 but is currently awaiting news on her housing situation. She has been living in different motels for the past 6 months and states she does not feel settled. She has family in [place] but they do not have space for her. She denies being told of any staring/unresponsive episodes. She denies any gaps in time,  olfactory/gustatory hallucinations, deja vu, rising epigastric sensation, myoclonic jerks. The left side of her face and left arm are constantly numb. Sometimes her left arm becomes weaker/drops down, with no jerking movements. Left leg is fine. She denies any falls. She has noticed the past couple of days feeling like she is "not whole" in her head, "like a spaciness." She has had this sensation several times on and off, then it would go away and she would "migrate back to whatever my reality is." She feels nervous and scared a lot and does not feel like herself. She feels "all alone, almost like I am trapped inside my body." She was in the ER a week ago for somnolence, attributed to newly started Tramadol and sertraline. She has stopped the sertraline, but continues to take prn Tramadol for pain from coughing spells. She reports being told it is due to GERD and has started Pepcid. She took 2 Tramadol yesterday, sometimes she takes 4 a day. She denies any dizziness, headaches, diplopia, neck/back pain, dysarthria/dysphagia, bowel/bladder dysfunction.   Epilepsy Risk Factors:  Right MCA stroke. Otherwise she had a normal birth and early development.  There is no history of febrile convulsions, CNS infections such as meningitis/encephalitis, significant traumatic brain injury, neurosurgical procedures, or family history of seizures.  PAST MEDICAL HISTORY: Past Medical History:  Diagnosis Date  . Chicken pox   . Cough   . Hyperlipidemia   . Hypertension   . Left-sided weakness   . Stroke ([place])    Pt had 2 strokes/weakness on the left side  . Type 2 diabetes mellitus ([facility])     MEDICATIONS: Current Outpatient Medications on File Prior to Visit  Medication Sig Dispense Refill  . amLODipine (NORVASC) 10 MG tablet Take 1 tablet (10 mg total) by mouth daily. 90 tablet 3  . amLODipine (NORVASC) 10 MG tablet Take 1 tablet (10 mg total) by mouth daily. 90 tablet 3  . aspirin EC 81 MG tablet Take 81 mg by  mouth daily.    Marland Kitchen dextromethorphan (DELSYM) 30 MG/5ML liquid 2 tsp every 12 hours as needed    . levETIRAcetam (KEPPRA) 1000 MG tablet Take 1 tablet (1,000 mg total) by mouth 2 (two) times daily. 180 tablet 3  . metFORMIN (GLUCOPHAGE) 1000 MG tablet Take 1 tablet (1,000 mg total) by mouth 2 (two) times daily with a meal. 180 tablet 3  . montelukast (SINGULAIR) 10 MG tablet Take 1 tablet (10 mg total) by mouth at bedtime. 30 tablet 11  . predniSONE (DELTASONE) 10 MG tablet Take  4 each am x 2 days,   2 each am x 2 days,  1 each am x 2 days and stop 14 tablet 0  . rosuvastatin (CRESTOR) 20 MG tablet Take 1 tablet (20 mg total) by mouth daily. 90 tablet 3   No current facility-administered medications on file prior to visit.     ALLERGIES: Allergies  Allergen Reactions  . Lisinopril Cough  . Vicodin [Hydrocodone-Acetaminophen] Palpitations    FAMILY HISTORY: Family History  Problem Relation Age of Onset  .  Pancreatic cancer Mother   . Colon cancer Father   . Hypertension Father   . Heart attack Sister        died around age 20  . Stroke Paternal Grandmother     SOCIAL HISTORY: Social History   Socioeconomic History  . Marital status: Single    Spouse name: Not on file  . Number of children: 1  . Years of education: 19  . Highest education level: Not on file  Occupational History  . Occupation: disabled  Social Needs  . Financial resource strain: Not on file  . Food insecurity:    Worry: Not on file    Inability: Not on file  . Transportation needs:    Medical: Not on file    Non-medical: Not on file  Tobacco Use  . Smoking status: Former Smoker    Packs/day: 0.25    Years: 10.00    Pack years: 2.50    Types: Cigarettes    Last attempt to quit: 01/12/2011    Years since quitting: 6.2  . Smokeless tobacco: Never Used  Substance and Sexual Activity  . Alcohol use: No  . Drug use: No  . Sexual activity: Not on file  Lifestyle  . Physical activity:    Days per  week: Not on file    Minutes per session: Not on file  . Stress: Not on file  Relationships  . Social connections:    Talks on phone: Not on file    Gets together: Not on file    Attends religious service: Not on file    Active member of club or organization: Not on file    Attends meetings of clubs or organizations: Not on file    Relationship status: Not on file  . Intimate partner violence:    Fear of current or ex partner: Not on file    Emotionally abused: Not on file    Physically abused: Not on file    Forced sexual activity: Not on file  Other Topics Concern  . Not on file  Social History Narrative   Lives alone in a one story home.  Has one daughter.  On disability.  Education: college.     REVIEW OF SYSTEMS: Constitutional: No fevers, chills, or sweats, no generalized fatigue, change in appetite Eyes: No visual changes, double vision, eye pain Ear, nose and throat: No hearing loss, ear pain, nasal congestion, sore throat Cardiovascular: No chest pain, palpitations Respiratory:  No shortness of breath at rest or with exertion, wheezes GastrointestinaI: No nausea, vomiting, diarrhea, abdominal pain, fecal incontinence Genitourinary:  No dysuria, urinary retention or frequency Musculoskeletal:  No neck pain, back pain Integumentary: No rash, pruritus, skin lesions Neurological: as above Psychiatric: No depression, insomnia, anxiety Endocrine: No palpitations, fatigue, diaphoresis, mood swings, change in appetite, change in weight, increased thirst Hematologic/Lymphatic:  No anemia, purpura, petechiae. Allergic/Immunologic: no itchy/runny eyes, nasal congestion, recent allergic reactions, rashes  PHYSICAL EXAM: Vitals:   04/22/17 1311  BP: 108/70  Pulse: 78  SpO2: 97%   General: No acute distress, flat affect  Head:  Normocephalic/atraumatic Neck: supple, no paraspinal tenderness, full range of motion Back: No paraspinal tenderness Heart: regular rate and  rhythm Lungs: Clear to auscultation bilaterally. Vascular: No carotid bruits. Skin/Extremities: No rash, no edema, spastic contracture on left hand Neurological Exam: Mental status: alert and oriented to person, place, and time, no dysarthria or aphasia, Fund of knowledge is appropriate.  Recent and remote memory are intact.  Attention  and concentration are normal.    Able to name objects and repeat phrases. CDT 5/5 MMSE - Mini Mental State Exam 04/22/2017  Orientation to time 5  Orientation to Place 5  Registration 3  Attention/ Calculation 5  Recall 3  Language- name 2 objects 2  Language- repeat 1  Language- follow 3 step command 3  Language- read & follow direction 1  Write a sentence 1  Copy design 1  Total score 30   Cranial nerves: CN I: not tested CN II: pupils equal, round and reactive to light, visual fields intact, fundi unremarkable. CN III, IV, VI:  full range of motion, no nystagmus, no ptosis CN V: facial sensation intact CN VII: shallow left nasolabial fold (similar to prior) CN VIII: hearing intact to finger rub CN IX, X: gag intact, uvula midline CN XI: sternocleidomastoid and trapezius muscles intact CN XII: tongue midline Bulk & Tone: increased tone on left UE with fingers flexed, no fasciculations. Motor: 5/5 throughout except for contracture of fingers on left hand, unable to extend fingers 3-/5, no pronator drift. Sensation: intact to light touch, cold, pin, vibration and joint position sense.  No extinction to double simultaneous stimulation.  Romberg test negative Deep Tendon Reflexes: asymmetric, more brisk +3 on left UE, +2 on right UE and LE, left patella, unable to elicit ankle jerks bilaterally, no ankle clonus Plantar responses: downgoing bilaterally Cerebellar: no incoordination on finger to nose testing Gait: slightly spastic hemiparetic gait with left leg Tremor: none  IMPRESSION: This is a 62 yo LH woman with a history of hypertension,  hyperlipidemia, diabetes, and strokes in March and April 2017. The first stroke involved the right temporal, right middle cerebellar peduncle, and right dorsolateral medulla, stroke was attributed to untreated hypertension. The stroke a week or so later in April 2017 involved the right MCA and was felt to be cardioembolic. Echo normal, per records, loop recorder did not show any evidence of atrial fibrillation. She continues on aspirin 81mg  daily for secondary stroke prevention, BP and lipid control. BP today 108/70. Continue control of vascular risk factors and aspirin. She was started on Keppra after the second stroke, presumably due to how she was found with saliva on side of mouth and decreased consciousness, EEG showed right frontotemporal slowing, no epileptiform discharges. No seizures or seizure-like symptoms reported, continue Keppra 1000mg  BID for now. Her main concern is feeling "not whole," on further questioning, it appears to be a combination of a foggy sensation in her head, as well as the left-sided weakness. She is hopeful for recovery of her left side, I discussed prognosis after stroke, at this point full recovery is no longer possible. I discussed adjusting her expectations for the future, she would like to return to school and will be referred for occupational therapy. She is reporting more balance issues and will be referred to PT as well for balance therapy. MMSE today is 30/30, she will be scheduled for Neurocognitive testing to further evaluate memory complaints. She knows to go to the ER immediately for any change in symptoms. She will follow-up in 6 months and knows to call for any changes  Thank you for allowing me to participate in her care.  Please do not hesitate to call for any questions or concerns.  The duration of this appointment visit was 30 minutes of face-to-face time with the patient.  Greater than 50% of this time was spent in counseling, explanation of diagnosis,  planning of further management, and coordination  of care.   Ellouise Newer, M.D.   CC: Dr. Lorelei Pont

## 2017-04-22 NOTE — Patient Instructions (Addendum)
1. Refer to PT and OT 2. Refer to [facility] for counseling for post-stroke depression  We have sent a referral to [facility].  Please call [phone] to schedule your first appointment.   3. Schedule Neurocognitive testing for memory testing 4. Continue all your medications, follow-up in 6 months, call for any changes

## 2017-04-23 ENCOUNTER — Encounter: Payer: Self-pay | Admitting: Family Medicine

## 2017-04-23 NOTE — Telephone Encounter (Signed)
We have orders in for a RUQ [place] and pelvic US for her- called GI and made sure that they have these orders, they will call and schedule her this week

## 2017-04-25 ENCOUNTER — Ambulatory Visit (INDEPENDENT_AMBULATORY_CARE_PROVIDER_SITE_OTHER): Payer: BLUE CROSS/BLUE SHIELD | Admitting: *Deleted

## 2017-04-25 DIAGNOSIS — I639 Cerebral infarction, unspecified: Secondary | ICD-10-CM

## 2017-04-26 NOTE — Progress Notes (Signed)
Carelink Summary Report / Loop Recorder 

## 2017-04-27 ENCOUNTER — Telehealth: Payer: Self-pay | Admitting: *Deleted

## 2017-04-27 NOTE — Telephone Encounter (Signed)
Received Physician Orders/Plan of Care from [facility]; forwarded to provider/SLS 04/17

## 2017-04-28 NOTE — Telephone Encounter (Signed)
Dr. Banks, okay to transfer? 

## 2017-04-30 LAB — CUP PACEART REMOTE DEVICE CHECK
Date Time Interrogation Session: 20190312033852
Implantable Pulse Generator Implant Date: 20170829

## 2017-05-03 NOTE — Telephone Encounter (Signed)
Please contact patient to arrange transfer of care with Dr. [name].

## 2017-05-03 NOTE — Telephone Encounter (Signed)
ok 

## 2017-05-05 ENCOUNTER — Telehealth: Payer: Self-pay | Admitting: Neurology

## 2017-05-05 ENCOUNTER — Telehealth: Payer: Self-pay | Admitting: Family Medicine

## 2017-05-05 DIAGNOSIS — N83201 Unspecified ovarian cyst, right side: Secondary | ICD-10-CM

## 2017-05-05 NOTE — Telephone Encounter (Signed)
Copied from [place] [phone]. Topic: Referral - Status >> Apr 22, 2017  2:14 PM Boyd Kerbs wrote: Reason for CRM:   pt. Called and wants to have referral sent to [facility].  They do not show anything.    >> May 05, 2017  1:28 PM Cleaster Corin, [place] wrote: Pt. States that she still hasn't heard anything yet from [facility] for the ultra sound

## 2017-05-05 NOTE — Telephone Encounter (Signed)
Patient called needing a letter written by Dr. Delice Lesch stating that she can have a Scribe with her in class to help her write. She is Left hand dominant and she said that was affected when she had her stroke. Please Advise. Thanks

## 2017-05-05 NOTE — Telephone Encounter (Signed)
NP appt scheduled 05/16/2017.

## 2017-05-06 ENCOUNTER — Encounter: Payer: Self-pay | Admitting: Gastroenterology

## 2017-05-06 ENCOUNTER — Ambulatory Visit: Payer: BLUE CROSS/BLUE SHIELD | Admitting: Gastroenterology

## 2017-05-06 VITALS — BP 114/72 | HR 66 | Ht 64.0 in | Wt 177.2 lb

## 2017-05-06 DIAGNOSIS — R059 Cough, unspecified: Secondary | ICD-10-CM

## 2017-05-06 DIAGNOSIS — R05 Cough: Secondary | ICD-10-CM | POA: Diagnosis not present

## 2017-05-06 DIAGNOSIS — Z1211 Encounter for screening for malignant neoplasm of colon: Secondary | ICD-10-CM

## 2017-05-06 DIAGNOSIS — Z1212 Encounter for screening for malignant neoplasm of rectum: Secondary | ICD-10-CM

## 2017-05-06 MED ORDER — NA SULFATE-K SULFATE-MG SULF 17.5-3.13-1.6 GM/177ML PO SOLN: 1.0000 | Freq: Once | ORAL | 0 refills | Status: AC

## 2017-05-06 NOTE — Telephone Encounter (Signed)
Called [facility] and they will schedule

## 2017-05-06 NOTE — Telephone Encounter (Signed)
Abdominal and pelvic US ordered on 4/9- will have to call GI when they open and ask
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
## 2017-05-06 NOTE — Patient Instructions (Signed)
You have been scheduled for an endoscopy and colonoscopy. Please follow the written instructions given to you at your visit today. Please pick up your prep supplies at the pharmacy within the next 1-3 days. If you use inhalers (even only as needed), please bring them with you on the day of your procedure. Your physician has requested that you go to www.startemmi.com and enter the access code given to you at your visit today. This web site gives a general overview about your procedure. However, you should still follow specific instructions given to you by our office regarding your preparation for the procedure.  Normal BMI (Body Mass Index- based on height and weight) is between 19 and 25. Your BMI today is Body mass index is 30.42 kg/m. Marland Kitchen Please consider follow up  regarding your BMI with your Primary Care Provider.  Thank you for choosing me and [facility].  Pricilla Riffle. Dagoberto Ligas., MD., Marval Regal

## 2017-05-09 ENCOUNTER — Encounter: Payer: Self-pay | Admitting: Family Medicine

## 2017-05-09 ENCOUNTER — Ambulatory Visit: Payer: BLUE CROSS/BLUE SHIELD | Admitting: Family Medicine

## 2017-05-09 VITALS — BP 122/70 | HR 82 | Temp 98.0°F | Wt 177.4 lb

## 2017-05-09 DIAGNOSIS — M25512 Pain in left shoulder: Secondary | ICD-10-CM

## 2017-05-09 NOTE — Patient Instructions (Signed)
Shoulder Pain Many things can cause shoulder pain, including:  An injury.  Moving the arm in the same way again and again (overuse).  Joint pain (arthritis).  Follow these instructions at home: Take these actions to help with your pain:  Squeeze a soft ball or a foam pad as much as you can. This helps to prevent swelling. It also makes the arm stronger.  Take over-the-counter and prescription medicines only as told by your doctor.  If told, put ice on the area: ? Put ice in a plastic bag. ? Place a towel between your skin and the bag. ? Leave the ice on for 20 minutes, 2-3 times per day. Stop putting on ice if it does not help with the pain.  If you were given a shoulder sling or immobilizer: ? Wear it as told. ? Remove it to shower or bathe. ? Move your arm as little as possible. ? Keep your hand moving. This helps prevent swelling.  Contact a doctor if:  Your pain gets worse.  Medicine does not help your pain.  You have new pain in your arm, hand, or fingers. Get help right away if:  Your arm, hand, or fingers: ? Tingle. ? Are numb. ? Are swollen. ? Are painful. ? Turn white or blue. This information is not intended to replace advice given to you by your health care provider. Make sure you discuss any questions you have with your health care provider. Document Released: 06/16/2007 Document Revised: 08/24/2015 Document Reviewed: 04/22/2014 Elsevier Interactive Patient Education  2018 Elsevier Inc.  
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
## 2017-05-10 ENCOUNTER — Encounter: Payer: Self-pay | Admitting: Neurology

## 2017-05-10 NOTE — Telephone Encounter (Signed)
Spoke with pt letting her know that letter is ready. Pt states she will be by the office tomorrow to pick up

## 2017-05-10 NOTE — Telephone Encounter (Signed)
Note done, thanks 

## 2017-05-11 ENCOUNTER — Encounter: Payer: Self-pay | Admitting: Family Medicine

## 2017-05-11 ENCOUNTER — Ambulatory Visit: Payer: BLUE CROSS/BLUE SHIELD | Admitting: Family Medicine

## 2017-05-11 VITALS — BP 120/68 | HR 87 | Temp 98.2°F | Wt 176.8 lb

## 2017-05-11 DIAGNOSIS — M25512 Pain in left shoulder: Secondary | ICD-10-CM | POA: Diagnosis not present

## 2017-05-11 NOTE — Progress Notes (Signed)
Subjective:    Patient ID: Wendy [name], female    DOB: 02/12/1955, 62 y.o.   MRN: [id number]  Chief Complaint  Patient presents with  . Shoulder Pain    HPI Patient was seen today for f/u on shoulder pain.  Pt states she wants a brace for her arm.  At last [facility] pt was asked if she ever required a brace for her hand or elbow.  She states it was not recommended.  Pt states she recently restarted PT and her next appt is on Friday.   Pt continues to endorse L shoulder pain.  Pain noted as constant.  Pt has not taken anything for the pain, nor had the shoulder xray done.  Pt states she plans to have the xray done tomorrow.  Pt is also requesting a note so that she can participate in a work study program.  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  Past Medical History:  Diagnosis Date  . Chicken pox   . Cough   . Hyperlipidemia   . Hypertension   . Left-sided weakness   . Stroke ([place])    Pt had 2 strokes/weakness on the left side  . Type 2 diabetes mellitus (HCC)     Allergies  Allergen Reactions  . Lisinopril Cough  . Vicodin [Hydrocodone-Acetaminophen] Palpitations    ROS General: Denies fever, chills, night sweats, changes in weight, changes in appetite HEENT: Denies headaches, ear pain, changes in vision, rhinorrhea, sore throat CV: Denies CP, palpitations, SOB, orthopnea Pulm: Denies SOB, cough, wheezing GI: Denies abdominal pain, nausea, vomiting, diarrhea, constipation GU: Denies dysuria, hematuria, frequency, vaginal discharge Msk: Denies muscle cramps, joint pains   +L shoulder pain Neuro: Denies weakness, numbness, tingling Skin: Denies rashes, bruising Psych: Denies depression, anxiety, hallucinations     Objective:    Blood pressure 120/68, pulse 87, temperature 98.2 F (36.8 C), temperature source Oral, weight 176 lb 12.8 oz (80.2 kg), SpO2 98 %.   Gen. Pleasant, well-nourished, in  no distress, normal affect   HEENT: [place]/AT, face symmetric, no scleral icterus, PERRLA, nares patent without drainage, pharynx without erythema or exudate. Lungs: no accessory muscle use Cardiovascular: RRR, no m/r/g, no peripheral edema Musculoskeletal: No cyanosis or clubbing, normal tone Neuro:  A&Ox3, CN II-XII intact, holding cane while ambulating and carrying her purse on her L shoulder.   Wt Readings from Last 3 Encounters:  05/11/17 176 lb 12.8 oz (80.2 kg)  05/09/17 177 lb 6.4 oz (80.5 kg)  05/06/17 177 lb 4 oz (80.4 kg)    Lab Results  Component Value Date   WBC 8.2 11/29/2016   HGB 13.4 11/29/2016   HCT 40.4 11/29/2016   PLT 227.0 11/29/2016   GLUCOSE 143 (H) 11/29/2016   CHOL 153 02/04/2016   TRIG 58.0 02/04/2016   HDL 69.90 02/04/2016   LDLCALC 72 02/04/2016   ALT 46 (H) 11/29/2016   AST 41 (H) 11/29/2016   NA 137 11/29/2016   K 4.9 11/29/2016   CL 100 11/29/2016   CREATININE 0.86 11/29/2016   BUN 9 11/29/2016   CO2 31 11/29/2016   TSH 0.71 02/04/2016   HGBA1C 7.6 (H) 04/06/2017   MICROALBUR 0.9 11/29/2016    Assessment/Plan:  Acute pain of left shoulder  -pt advised to take Tylenol prn for pain.  Can also use heat. -pt plans to get xray L shoulder tomorrow. -Pt advised she may not benefit from a  brace, hence the reason it may not have been suggested. -letter given stating pt can participate in work study activity as tolerated, however not sure if pt is allowed to work as she is on disability. -f/u prn  Grier Mitts, MD

## 2017-05-11 NOTE — Patient Instructions (Signed)
It is ok to take tylenol prn for your left shoulder pain.

## 2017-05-13 ENCOUNTER — Telehealth: Payer: Self-pay | Admitting: *Deleted

## 2017-05-13 ENCOUNTER — Ambulatory Visit (INDEPENDENT_AMBULATORY_CARE_PROVIDER_SITE_OTHER)
Admission: RE | Admit: 2017-05-13 | Discharge: 2017-05-13 | Disposition: A | Discharge status: Home / Self Care | Payer: BLUE CROSS/BLUE SHIELD | Source: Ambulatory Visit | Attending: Family Medicine | Admitting: Family Medicine

## 2017-05-13 DIAGNOSIS — M25512 Pain in left shoulder: Secondary | ICD-10-CM

## 2017-05-13 NOTE — Telephone Encounter (Signed)
Okay to send updated letter?

## 2017-05-13 NOTE — Telephone Encounter (Signed)
Pt did not mention what the note need to say other than "she can participate in work study".  Unclear if pt is suppose to be working while on disability?  Please clarify and ask if any other info needs to be on this note?  Im ok with another note if needed.

## 2017-05-13 NOTE — Telephone Encounter (Signed)
Copied from [place] [id number]. Topic: General - Other >> May 12, 2017  9:37 AM Neva Seat wrote: Pt received a note from Dr. [name].  It dosen't have that she is  available to obtain gainful employment. Please fax the updated note to:  Fax to   [employer] [phone] Wendy [name]

## 2017-05-16 ENCOUNTER — Ambulatory Visit: Payer: BLUE CROSS/BLUE SHIELD | Admitting: Family Medicine

## 2017-05-16 DIAGNOSIS — Z0289 Encounter for other administrative examinations: Secondary | ICD-10-CM

## 2017-05-16 NOTE — Telephone Encounter (Signed)
Patient states she can work while on disability. No other information needs to be included in the note.

## 2017-05-16 NOTE — Telephone Encounter (Signed)
Letter faxed as requested.  Fax confirmation received.

## 2017-05-18 ENCOUNTER — Ambulatory Visit
Admission: RE | Admit: 2017-05-18 | Discharge: 2017-05-18 | Disposition: A | Discharge status: Home / Self Care | Payer: BLUE CROSS/BLUE SHIELD | Source: Ambulatory Visit | Attending: Family Medicine | Admitting: Family Medicine

## 2017-05-18 ENCOUNTER — Other Ambulatory Visit: Payer: BLUE CROSS/BLUE SHIELD

## 2017-05-18 DIAGNOSIS — R7989 Other specified abnormal findings of blood chemistry: Secondary | ICD-10-CM

## 2017-05-18 DIAGNOSIS — N83201 Unspecified ovarian cyst, right side: Secondary | ICD-10-CM

## 2017-05-18 DIAGNOSIS — R945 Abnormal results of liver function studies: Principal | ICD-10-CM

## 2017-05-19 ENCOUNTER — Telehealth: Payer: Self-pay | Admitting: Family Medicine

## 2017-05-19 NOTE — Telephone Encounter (Signed)
Pt gave me contact number at [place] [address]  Per PCP in [place] was Whitney Post

## 2017-05-19 NOTE — Telephone Encounter (Signed)
Received her pelvic US report:  TRANSABDOMINAL AND TRANSVAGINAL ULTRASOUND OF PELVIS  TECHNIQUE: Both transabdominal and transvaginal ultrasound examinations of the pelvis were performed. Transabdominal technique was performed for global imaging of the pelvis including uterus, ovaries, adnexal regions, and pelvic cul-de-sac. It was necessary to proceed with endovaginal exam following the transabdominal exam to visualize the endometrium and ovaries. COMPARISON: None FINDINGS: Uterus Measurements: 7.9 x 5.4 x 4.9 cm. 2.8 cm fibroid is noted anteriorly in the fundus. 1.9 cm fibroid is noted posteriorly. Endometrium Thickness: 2 mm which is within normal limits. No focal abnormality visualized. Right ovary Not visualized. 7.7 cm simple right adnexal cyst is noted. Left ovary Not visualized. Other findings No abnormal free fluid. IMPRESSION: Ovaries are not visualized. 7.7 cm right adnexal cyst is noted. Given that the patient is postmenopausal, further evaluation with MRI is recommended to rule out neoplasm.  Two uterine fibroids are noted.  Called pt - she states that back in [place] she was getting worked-up for this ?cyst issue, she thinks that she already had an MRI?  This would have been done through [facility] will get me a name or contact number of the doctor she was working with at [place] so I can follow-up

## 2017-05-23 ENCOUNTER — Encounter: Payer: Self-pay | Admitting: Rehabilitation

## 2017-05-23 ENCOUNTER — Encounter: Payer: Self-pay | Admitting: Gastroenterology

## 2017-05-23 ENCOUNTER — Other Ambulatory Visit: Payer: Self-pay

## 2017-05-23 ENCOUNTER — Ambulatory Visit: Payer: BLUE CROSS/BLUE SHIELD | Attending: Neurology | Admitting: Occupational Therapy

## 2017-05-23 ENCOUNTER — Ambulatory Visit: Payer: BLUE CROSS/BLUE SHIELD | Admitting: Rehabilitation

## 2017-05-23 ENCOUNTER — Encounter: Payer: Self-pay | Admitting: Occupational Therapy

## 2017-05-23 DIAGNOSIS — M25512 Pain in left shoulder: Secondary | ICD-10-CM | POA: Diagnosis present

## 2017-05-23 DIAGNOSIS — M25612 Stiffness of left shoulder, not elsewhere classified: Secondary | ICD-10-CM | POA: Diagnosis present

## 2017-05-23 DIAGNOSIS — Z8673 Personal history of transient ischemic attack (TIA), and cerebral infarction without residual deficits: Secondary | ICD-10-CM | POA: Diagnosis not present

## 2017-05-23 DIAGNOSIS — G8929 Other chronic pain: Secondary | ICD-10-CM | POA: Diagnosis present

## 2017-05-23 DIAGNOSIS — I69352 Hemiplegia and hemiparesis following cerebral infarction affecting left dominant side: Secondary | ICD-10-CM | POA: Diagnosis not present

## 2017-05-23 NOTE — Therapy (Signed)
[facility] [address] [place], [place], [address] Phone: [phone]   Fax:  [phone]  Patient Details  Name: Wendy [name] MRN: [id number] Date of Birth: 1955/09/02 Referring Provider:  Cameron Sprang, MD  Encounter Date: 05/23/2017   Pt arrived for PT evaluation this morning, however note that she is getting PT currently at a different location.  Discussed that pt is not able to receive PT at two different locations.  Pt verbalized understanding and will await OT evaluation.    Cameron Sprang, PT, MPT [facility] [address] [place], [place], [address] Phone: [phone]   Fax:  [phone] 05/23/17, 9:43 AM
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
## 2017-05-24 ENCOUNTER — Telehealth: Payer: Self-pay | Admitting: Gastroenterology

## 2017-05-24 NOTE — Telephone Encounter (Signed)
Informed patient we have a sample of Suprep she can come by our office to pick up. Patient states she will have her sister come by this week to pick it up.

## 2017-05-25 NOTE — Telephone Encounter (Signed)
Dr. Arsenio Loader: [facility]  [address] [place] [address] [phone]  Called them and after a lengthly process was able to have an email message sent to her PCP (who is not [facility]?after all ).  Was not able to speak to her doc directly.  Left message with my cell and a request to call me please

## 2017-05-26 ENCOUNTER — Ambulatory Visit (INDEPENDENT_AMBULATORY_CARE_PROVIDER_SITE_OTHER): Payer: BLUE CROSS/BLUE SHIELD | Admitting: *Deleted

## 2017-05-26 ENCOUNTER — Telehealth: Payer: Self-pay | Admitting: Neurology

## 2017-05-26 DIAGNOSIS — Z8673 Personal history of transient ischemic attack (TIA), and cerebral infarction without residual deficits: Secondary | ICD-10-CM | POA: Diagnosis not present

## 2017-05-26 NOTE — Telephone Encounter (Signed)
Patient is calling to check on the referral that we were to being doing to Dr Tyson Dense please call patient

## 2017-05-26 NOTE — Telephone Encounter (Signed)
Spoke with pt.  She states that Dr. Delice Lesch was to place referral to Dr. Corliss Blacker.  This is nowhere in pt's chart.  Called and spoke with PT/OT - pt only had limited visits with them.  According to their notes, it appears that pt can use her L hand - but when she tried to open/close hand is when it becomes spastic.  OT does not want to waste pt's visits.

## 2017-05-27 ENCOUNTER — Telehealth: Payer: Self-pay | Admitting: Neurology

## 2017-05-27 ENCOUNTER — Other Ambulatory Visit: Payer: Self-pay

## 2017-05-27 DIAGNOSIS — R531 Weakness: Secondary | ICD-10-CM

## 2017-05-27 DIAGNOSIS — Z8673 Personal history of transient ischemic attack (TIA), and cerebral infarction without residual deficits: Secondary | ICD-10-CM

## 2017-05-27 NOTE — Telephone Encounter (Signed)
Orders placed.  Dr. Providence Lanius office will contact pt to schedule.

## 2017-05-27 NOTE — Telephone Encounter (Signed)
Pt called and needs a referral to Dr Alysia Penna for hand to be able to be evaluated, please advise

## 2017-05-27 NOTE — Progress Notes (Signed)
Carelink Summary Report / Loop Recorder 

## 2017-05-30 LAB — CUP PACEART REMOTE DEVICE CHECK
Date Time Interrogation Session: 20190414033549
Implantable Pulse Generator Implant Date: 20170829

## 2017-06-01 ENCOUNTER — Encounter: Payer: Medicaid Other | Admitting: Internal Medicine

## 2017-06-01 ENCOUNTER — Ambulatory Visit (AMBULATORY_SURGERY_CENTER): Payer: BLUE CROSS/BLUE SHIELD | Admitting: Gastroenterology

## 2017-06-01 ENCOUNTER — Other Ambulatory Visit: Payer: Self-pay

## 2017-06-01 ENCOUNTER — Encounter: Payer: Self-pay | Admitting: Gastroenterology

## 2017-06-01 VITALS — BP 140/87 | HR 104 | Temp 97.7°F | Resp 11 | Ht 64.0 in | Wt 177.0 lb

## 2017-06-01 DIAGNOSIS — R05 Cough: Secondary | ICD-10-CM | POA: Diagnosis not present

## 2017-06-01 DIAGNOSIS — K2950 Unspecified chronic gastritis without bleeding: Secondary | ICD-10-CM | POA: Diagnosis not present

## 2017-06-01 DIAGNOSIS — K2970 Gastritis, unspecified, without bleeding: Secondary | ICD-10-CM

## 2017-06-01 DIAGNOSIS — Z800 Family history of malignant neoplasm of digestive organs: Secondary | ICD-10-CM

## 2017-06-01 DIAGNOSIS — K219 Gastro-esophageal reflux disease without esophagitis: Secondary | ICD-10-CM

## 2017-06-01 DIAGNOSIS — Z1211 Encounter for screening for malignant neoplasm of colon: Secondary | ICD-10-CM

## 2017-06-01 DIAGNOSIS — B9681 Helicobacter pylori [H. pylori] as the cause of diseases classified elsewhere: Secondary | ICD-10-CM | POA: Diagnosis not present

## 2017-06-01 DIAGNOSIS — R059 Cough, unspecified: Secondary | ICD-10-CM

## 2017-06-01 MED ORDER — SODIUM CHLORIDE 0.9 % IV SOLN
500.0000 mL | Freq: Once | INTRAVENOUS | Status: DC
Start: 2017-06-01 — End: 2017-07-22

## 2017-06-01 MED ORDER — SODIUM CHLORIDE 0.9 % IV SOLN: 500.0000 mL | Freq: Once | INTRAVENOUS | Status: DC

## 2017-06-01 MED ORDER — FAMOTIDINE 20 MG PO TABS
20.0000 mg | ORAL_TABLET | Freq: Every day | ORAL | 3 refills | Status: DC
Start: 2017-06-01 — End: 2018-08-29

## 2017-06-01 MED ORDER — OMEPRAZOLE 40 MG PO CPDR: 40.0000 mg | DELAYED_RELEASE_CAPSULE | Freq: Every day | ORAL | 3 refills | Status: DC
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
## 2017-06-01 NOTE — Patient Instructions (Signed)
YOU HAD AN ENDOSCOPIC PROCEDURE TODAY AT [facility]:   Refer to the procedure report that was given to you for any specific questions about what was found during the examination.  If the procedure report does not answer your questions, please call your gastroenterologist to clarify.  If you requested that your care partner not be given the details of your procedure findings, then the procedure report has been included in a sealed envelope for you to review at your convenience later.  YOU SHOULD EXPECT: Some feelings of bloating in the abdomen. Passage of more gas than usual.  Walking can help get rid of the air that was put into your GI tract during the procedure and reduce the bloating. If you had a lower endoscopy (such as a colonoscopy or flexible sigmoidoscopy) you may notice spotting of blood in your stool or on the toilet paper. If you underwent a bowel prep for your procedure, you may not have a normal bowel movement for a few days.  Please Note:  You might notice some irritation and congestion in your nose or some drainage.  This is from the oxygen used during your procedure.  There is no need for concern and it should clear up in a day or so.  SYMPTOMS TO REPORT IMMEDIATELY:   Following lower endoscopy (colonoscopy or flexible sigmoidoscopy):  Excessive amounts of blood in the stool  Significant tenderness or worsening of abdominal pains  Swelling of the abdomen that is new, acute  Fever of 100F or higher   Following upper endoscopy (EGD)  Vomiting of blood or coffee ground material  New chest pain or pain under the shoulder blades  Painful or persistently difficult swallowing  New shortness of breath  Fever of 100F or higher  Black, tarry-looking stools  For urgent or emergent issues, a gastroenterologist can be reached at any hour by calling [phone].  Please see handout on gastritis.  DIET:  We do recommend a small meal at first, but then you may  proceed to your regular diet.  Drink plenty of fluids but you should avoid alcoholic beverages for 24 hours.  ACTIVITY:  You should plan to take it easy for the rest of today and you should NOT DRIVE or use heavy machinery until tomorrow (because of the sedation medicines used during the test).    FOLLOW UP: Our staff will call the number listed on your records the next business day following your procedure to check on you and address any questions or concerns that you may have regarding the information given to you following your procedure. If we do not reach you, we will leave a message.  However, if you are feeling well and you are not experiencing any problems, there is no need to return our call.  We will assume that you have returned to your regular daily activities without incident.  If any biopsies were taken you will be contacted by phone or by letter within the next 1-3 weeks.  Please call us at [phone] if you have not heard about the biopsies in 3 weeks.    SIGNATURES/CONFIDENTIALITY: You and/or your care partner have signed paperwork which will be entered into your electronic medical record.  These signatures attest to the fact that that the information above on your After Visit Summary has been reviewed and is understood.  Full responsibility of the confidentiality of this discharge information lies with you and/or your care-partner.   Thank you for allowing [place]  to provide your healthcare today.

## 2017-06-01 NOTE — Op Note (Signed)
[facility] Patient Name: Wendy [name] Procedure Date: 06/01/2017 2:14 PM MRN: [id number] Endoscopist: Ladene Artist , MD Age: 61 Referring MD:  Date of Birth: October 16, 1955 Gender: Female Account #: [id number] Procedure:                Colonoscopy Indications:              Screening in patient at increased risk: Family                            history of 1st-degree relative with colorectal                            cancer Medicines:                Monitored Anesthesia Care Procedure:                Pre-Anesthesia Assessment:                           - Prior to the procedure, a History and Physical                            was performed, and patient medications and                            allergies were reviewed. The patient's tolerance of                            previous anesthesia was also reviewed. The risks                            and benefits of the procedure and the sedation                            options and risks were discussed with the patient.                            All questions were answered, and informed consent                            was obtained. Prior Anticoagulants: The patient has                            taken no previous anticoagulant or antiplatelet                            agents. ASA Grade Assessment: II - A patient with                            mild systemic disease. After reviewing the risks                            and benefits, the patient was deemed in  satisfactory condition to undergo the procedure.                           After obtaining informed consent, the colonoscope                            was passed under direct vision. Throughout the                            procedure, the patient's blood pressure, pulse, and                            oxygen saturations were monitored continuously. The                            Model PCF-[id number]) scope was introduced                    through the anus and advanced to the the cecum,                            identified by appendiceal orifice and ileocecal                            valve. The ileocecal valve, appendiceal orifice,                            and rectum were photographed. The quality of the                            bowel preparation was good. The colonoscopy was                            performed without difficulty. The patient tolerated                            the procedure well. Scope In: 2:44:00 PM Scope Out: 2:56:04 PM Scope Withdrawal Time: 0 hours 9 minutes 27 seconds  Total Procedure Duration: 0 hours 12 minutes 4 seconds  Findings:                 The perianal and digital rectal examinations were                            normal.                           The entire examined colon appeared normal on direct                            and retroflexion views. Complications:            No immediate complications. Estimated blood loss:                            None. Estimated Blood Loss:     Estimated blood loss: none. Impression:               -  The entire examined colon is normal on direct and                            retroflexion views.                           - No specimens collected. Recommendation:           - Repeat colonoscopy in 5 years for screening                            purposes.                           - Patient has a contact number available for                            emergencies. The signs and symptoms of potential                            delayed complications were discussed with the                            patient. Return to normal activities tomorrow.                            Written discharge instructions were provided to the                            patient.                           - Resume previous diet.                           - Continue present medications. Ladene Artist, MD 06/01/2017 3:07:46 PM This report has been  signed electronically.

## 2017-06-01 NOTE — Progress Notes (Signed)
Called to room to assist during endoscopic procedure.  Patient ID and intended procedure confirmed with present staff. Received instructions for my participation in the procedure from the performing physician.  

## 2017-06-01 NOTE — Progress Notes (Signed)
Report given to PACU, vss 

## 2017-06-02 ENCOUNTER — Telehealth: Payer: Self-pay | Admitting: *Deleted

## 2017-06-02 NOTE — Telephone Encounter (Signed)
  Follow up Call-  Call back number 06/01/2017  Post procedure Call Back phone  # [phone]  Permission to leave phone message Yes     Patient questions:  Do you have a fever, pain , or abdominal swelling? No. Pain Score  0 *  Have you tolerated food without any problems? Yes.    Have you been able to return to your normal activities? Yes.    Do you have any questions about your discharge instructions: Diet   No. Medications  No. Follow up visit  No.  Do you have questions or concerns about your Care? No.  Actions: * If pain score is 4 or above: No action needed, pain <4.

## 2017-06-07 ENCOUNTER — Telehealth: Payer: Self-pay | Admitting: *Deleted

## 2017-06-07 NOTE — Telephone Encounter (Signed)
Sent to our office for Lamar Blinks, MD; I have been forwarding these physician orders to new PCP: Billie Ruddy, MD at [PH] [phone] and [Fax] [phone], as of 05/09/17. I have forwarded back to [facility] today at [phone] and requested that they update their records/SLS 05/28

## 2017-06-16 ENCOUNTER — Other Ambulatory Visit: Payer: Self-pay

## 2017-06-16 MED ORDER — BIS SUBCIT-METRONID-TETRACYC 140-125-125 MG PO CAPS: 3.0000 | ORAL_CAPSULE | Freq: Three times a day (TID) | ORAL | 0 refills | Status: DC

## 2017-06-17 LAB — CUP PACEART REMOTE DEVICE CHECK
Date Time Interrogation Session: 20190517081116
MDC IDC PG IMPLANT DT: 20170829

## 2017-06-20 ENCOUNTER — Telehealth: Payer: Self-pay | Admitting: Neurology

## 2017-06-20 NOTE — Telephone Encounter (Signed)
Patient called and would like you to please call her regarding her Therapy appointments and she had a question regarding a referral? Please Call. Thanks

## 2017-06-21 ENCOUNTER — Encounter: Payer: Self-pay | Admitting: Family Medicine

## 2017-06-21 ENCOUNTER — Ambulatory Visit: Payer: BLUE CROSS/BLUE SHIELD | Admitting: Family Medicine

## 2017-06-21 VITALS — BP 122/74 | HR 72 | Temp 97.7°F | Wt 179.0 lb

## 2017-06-21 DIAGNOSIS — M25552 Pain in left hip: Secondary | ICD-10-CM

## 2017-06-21 NOTE — Patient Instructions (Signed)

## 2017-06-21 NOTE — Telephone Encounter (Signed)
Returned call.  Pt states that she never called our office and that she does not have any questions at this time.
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
## 2017-06-22 ENCOUNTER — Telehealth: Payer: Self-pay | Admitting: Gastroenterology

## 2017-06-22 MED ORDER — BISMUTH SUBSALICYLATE 262 MG PO CHEW: 524.0000 mg | CHEWABLE_TABLET | Freq: Four times a day (QID) | ORAL | 0 refills | Status: DC

## 2017-06-22 MED ORDER — METRONIDAZOLE 250 MG PO TABS: 250.0000 mg | ORAL_TABLET | Freq: Four times a day (QID) | ORAL | 0 refills | Status: DC

## 2017-06-22 MED ORDER — DOXYCYCLINE HYCLATE 100 MG PO CAPS
100.0000 mg | ORAL_CAPSULE | Freq: Two times a day (BID) | ORAL | 0 refills | Status: DC
Start: 2017-06-22 — End: 2017-06-22

## 2017-06-22 MED ORDER — DOXYCYCLINE HYCLATE 100 MG PO CAPS
100.0000 mg | ORAL_CAPSULE | Freq: Two times a day (BID) | ORAL | 0 refills | Status: DC
Start: 2017-06-22 — End: 2017-08-19

## 2017-06-22 MED ORDER — DOXYCYCLINE HYCLATE 100 MG PO CAPS: 100.0000 mg | ORAL_CAPSULE | Freq: Two times a day (BID) | ORAL | 0 refills | Status: DC

## 2017-06-22 NOTE — Addendum Note (Signed)
Addended by: Marzella Schlein on: 06/22/2017 10:36 AM   Modules accepted: Orders

## 2017-06-22 NOTE — Telephone Encounter (Signed)
Patient states she was upset because nobody told her she had a prescription at the pharmacy. Informed patient Barb Merino, RN has been trying to contact her since 06/16/17 and has been unable to reach her and could not leave a message. Patient states she was still upset because she did not get these messages. Informed patient again that our office was unable to leave a message due to her mailbox being full. Patient proceeded to tell me to send her in another medication because the one at the pharmacy is too expensive. Informed patient that was the reason for my call. Patient asked me how much this prescription was going to cost her. Informed patient we do not know how much the prescriptions cost with her insurance but she can ask the pharmacist. Prescriptions sent in place of the [facility] to pharmacy and informed patient on instructions on how to take the medications. Patient verbalized understanding.

## 2017-06-22 NOTE — Addendum Note (Signed)
Addended by: Marzella Schlein on: 06/22/2017 10:38 AM   Modules accepted: Orders

## 2017-06-28 ENCOUNTER — Ambulatory Visit (INDEPENDENT_AMBULATORY_CARE_PROVIDER_SITE_OTHER)
Admission: RE | Admit: 2017-06-28 | Discharge: 2017-06-28 | Disposition: A | Discharge status: Home / Self Care | Payer: BLUE CROSS/BLUE SHIELD | Source: Ambulatory Visit | Attending: Family Medicine | Admitting: Family Medicine

## 2017-06-28 DIAGNOSIS — M25552 Pain in left hip: Secondary | ICD-10-CM | POA: Diagnosis not present

## 2017-06-29 ENCOUNTER — Ambulatory Visit (INDEPENDENT_AMBULATORY_CARE_PROVIDER_SITE_OTHER): Payer: BLUE CROSS/BLUE SHIELD | Admitting: *Deleted

## 2017-06-29 DIAGNOSIS — I639 Cerebral infarction, unspecified: Secondary | ICD-10-CM | POA: Diagnosis not present

## 2017-06-29 NOTE — Progress Notes (Signed)
Carelink Summary Report / Loop Recorder 

## 2017-06-30 ENCOUNTER — Ambulatory Visit: Payer: BLUE CROSS/BLUE SHIELD | Admitting: Physical Medicine & Rehabilitation

## 2017-07-13 ENCOUNTER — Encounter: Payer: Self-pay | Admitting: Family Medicine

## 2017-07-13 DIAGNOSIS — N949 Unspecified condition associated with female genital organs and menstrual cycle: Secondary | ICD-10-CM

## 2017-07-18 ENCOUNTER — Encounter: Payer: Self-pay | Admitting: Physical Medicine & Rehabilitation

## 2017-07-18 ENCOUNTER — Ambulatory Visit: Payer: BLUE CROSS/BLUE SHIELD | Admitting: Physical Medicine & Rehabilitation

## 2017-07-18 ENCOUNTER — Ambulatory Visit: Payer: Medicaid Other | Admitting: Internal Medicine

## 2017-07-18 ENCOUNTER — Encounter: Payer: BLUE CROSS/BLUE SHIELD | Attending: Physical Medicine & Rehabilitation

## 2017-07-18 VITALS — BP 133/82 | HR 84 | Resp 14 | Ht 64.0 in | Wt 179.0 lb

## 2017-07-18 DIAGNOSIS — I10 Essential (primary) hypertension: Secondary | ICD-10-CM | POA: Diagnosis not present

## 2017-07-18 DIAGNOSIS — Z8249 Family history of ischemic heart disease and other diseases of the circulatory system: Secondary | ICD-10-CM | POA: Diagnosis not present

## 2017-07-18 DIAGNOSIS — Z800 Family history of malignant neoplasm of digestive organs: Secondary | ICD-10-CM | POA: Insufficient documentation

## 2017-07-18 DIAGNOSIS — E119 Type 2 diabetes mellitus without complications: Secondary | ICD-10-CM | POA: Diagnosis not present

## 2017-07-18 DIAGNOSIS — G8112 Spastic hemiplegia affecting left dominant side: Secondary | ICD-10-CM

## 2017-07-18 DIAGNOSIS — I69354 Hemiplegia and hemiparesis following cerebral infarction affecting left non-dominant side: Secondary | ICD-10-CM | POA: Diagnosis present

## 2017-07-18 DIAGNOSIS — Z87891 Personal history of nicotine dependence: Secondary | ICD-10-CM | POA: Insufficient documentation

## 2017-07-18 DIAGNOSIS — E785 Hyperlipidemia, unspecified: Secondary | ICD-10-CM | POA: Diagnosis not present
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
## 2017-07-18 NOTE — Patient Instructions (Signed)
OnabotulinumtoxinA injection (Medical Use) What is this medicine? ONABOTULINUMTOXINA (o na BOTT you lye num tox in eh) is a neuro-muscular blocker. This medicine is used to treat crossed eyes, eyelid spasms, severe neck muscle spasms, ankle and toe muscle spasms, and elbow, wrist, and finger muscle spasms. It is also used to treat excessive underarm sweating, to prevent chronic migraine headaches, and to treat loss of bladder control due to neurologic conditions such as multiple sclerosis or spinal cord injury. This medicine may be used for other purposes; ask your health care provider or pharmacist if you have questions. COMMON BRAND NAME(S): Botox What should I tell my health care provider before I take this medicine? They need to know if you have any of these conditions: -breathing problems -cerebral palsy spasms -difficulty urinating -heart problems -history of surgery where this medicine is going to be used -infection at the site where this medicine is going to be used -myasthenia gravis or other neurologic disease -nerve or muscle disease -surgery plans -take medicines that treat or prevent blood clots -thyroid problems -an unusual or allergic reaction to botulinum toxin, albumin, other medicines, foods, dyes, or preservatives -pregnant or trying to get pregnant -breast-feeding How should I use this medicine? This medicine is for injection into a muscle. It is given by a health care professional in a hospital or clinic setting. Talk to your pediatrician regarding the use of this medicine in children. While this drug may be prescribed for children as young as 12 years old for selected conditions, precautions do apply. Overdosage: If you think you have taken too much of this medicine contact a poison control center or emergency room at once. NOTE: This medicine is only for you. Do not share this medicine with others. What if I miss a dose? This does not apply. What may interact with  this medicine? -aminoglycoside antibiotics like gentamicin, neomycin, tobramycin -muscle relaxants -other botulinum toxin injections This list may not describe all possible interactions. Give your health care provider a list of all the medicines, herbs, non-prescription drugs, or dietary supplements you use. Also tell them if you smoke, drink alcohol, or use illegal drugs. Some items may interact with your medicine. What should I watch for while using this medicine? Visit your doctor for regular check ups. This medicine will cause weakness in the muscle where it is injected. Tell your doctor if you feel unusually weak in other muscles. Get medical help right away if you have problems with breathing, swallowing, or talking. This medicine might make your eyelids droop or make you see blurry or double. If you have weak muscles or trouble seeing do not drive a car, use machinery, or do other dangerous activities. This medicine contains albumin from human blood. It may be possible to pass an infection in this medicine, but no cases have been reported. Talk to your doctor about the risks and benefits of this medicine. If your activities have been limited by your condition, go back to your regular routine slowly after treatment with this medicine. What side effects may I notice from receiving this medicine? Side effects that you should report to your doctor or health care professional as soon as possible: -allergic reactions like skin rash, itching or hives, swelling of the face, lips, or tongue -breathing problems -changes in vision -chest pain or tightness -eye irritation, pain -fast, irregular heartbeat -infection -numbness -speech problems -swallowing problems -unusual weakness Side effects that usually do not require medical attention (report to your doctor or health care   professional if they continue or are bothersome): -bruising or pain at site where injected -drooping eyelid -dry eyes or  mouth -headache -muscles aches, pains -sensitivity to light -tearing This list may not describe all possible side effects. Call your doctor for medical advice about side effects. You may report side effects to FDA at 1-800-FDA-1088. Where should I keep my medicine? This drug is given in a hospital or clinic and will not be stored at home. NOTE: This sheet is a summary. It may not cover all possible information. If you have questions about this medicine, talk to your doctor, pharmacist, or health care provider.  2018 Elsevier/Gold Standard (2014-02-05 15:43:53)  

## 2017-07-20 ENCOUNTER — Encounter: Payer: Self-pay | Admitting: Family Medicine

## 2017-07-20 NOTE — Telephone Encounter (Signed)
Called pt to follow-up on my recent mychart message- not yet read.   LMOM on the number listed although the voice does not sound like pt.  I am going to go ahead and order MRI - however on chart review pt has loop recorder.  Called radiology who recommended CT with and without contrast for her.    Ordered this test   Pelvic US from 5/19: IMPRESSION: Ovaries are not visualized. 7.7 cm right adnexal cyst is noted. Given that the patient is postmenopausal, further evaluation with MRI is recommended to rule out neoplasm.  Two uterine fibroids are noted.

## 2017-07-21 ENCOUNTER — Other Ambulatory Visit: Payer: Self-pay

## 2017-07-21 DIAGNOSIS — N949 Unspecified condition associated with female genital organs and menstrual cycle: Secondary | ICD-10-CM

## 2017-07-22 ENCOUNTER — Other Ambulatory Visit: Payer: Self-pay

## 2017-07-22 ENCOUNTER — Inpatient Hospital Stay: Admission: RE | Admit: 2017-07-22 | Payer: BLUE CROSS/BLUE SHIELD | Source: Ambulatory Visit

## 2017-07-22 ENCOUNTER — Telehealth: Payer: Self-pay

## 2017-07-22 ENCOUNTER — Ambulatory Visit
Admission: RE | Admit: 2017-07-22 | Discharge: 2017-07-22 | Disposition: A | Discharge status: Home / Self Care | Payer: BLUE CROSS/BLUE SHIELD | Source: Ambulatory Visit | Attending: Family Medicine | Admitting: Family Medicine

## 2017-07-22 ENCOUNTER — Other Ambulatory Visit: Payer: Self-pay | Admitting: Family Medicine

## 2017-07-22 DIAGNOSIS — N949 Unspecified condition associated with female genital organs and menstrual cycle: Secondary | ICD-10-CM

## 2017-07-22 MED ORDER — IOHEXOL 300 MG/ML  SOLN
100.0000 mL | Freq: Once | INTRAMUSCULAR | Status: AC | PRN
  Administered 2017-07-22: 100 mL via INTRAVENOUS

## 2017-07-22 NOTE — Telephone Encounter (Signed)
Wendy [name], assistant from [facility] phoned author to relay that recent CT is most consistent with a neoplasm. Routed to ordering provider, Dr. Lorelei Pont, as well as PCP, with imaging call back number given: [phone].

## 2017-07-22 NOTE — Telephone Encounter (Signed)
[facility] calling to receive authorization for the CT pelvis with contrast. Call transferred to [place], [place].

## 2017-07-24 ENCOUNTER — Telehealth: Payer: Self-pay | Admitting: Family Medicine

## 2017-07-24 DIAGNOSIS — D4959 Neoplasm of unspecified behavior of other genitourinary organ: Secondary | ICD-10-CM

## 2017-07-24 NOTE — Telephone Encounter (Signed)
Have called on 7/12 and again today.  LMOM on 7/12.  Was able to reach her today regarding her CT scan The 404 number is good after all  Explained to her that there is concern of her right adnexal mass being cancerous, and I would like to have her see a Research officer, [employer].  She is in agreement and would like to have her mass removed asap. Asked her to let me know if she does not hear about her referral in short order and she agrees JC  IMPRESSION: 1. There is a large primarily cystic mass in the right adnexa measuring up to 7.4 cm with a peripheral calcification. Whether this mass arises within the adnexa, from the fallopian tube, or from the ovary is unclear on this study. Recommend gynecologic consultation with consideration of MRI for further characterization. The mass is most consistent with a neoplasm in a patient of this age. 2. There is a cuff of increased attenuation around the femoral vessels with some mild adjacent fat stranding. This could represent sequela of vasculitis. Recommend clinical correlation. 3. 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 4. Fibroid uterus.

## 2017-07-25 NOTE — Telephone Encounter (Signed)
Pt calling back. She just missed a call

## 2017-07-26 NOTE — Telephone Encounter (Signed)
Patient returning call, verified patient # [phone], patient states It took her awhile to reach the phone and by the time she did it was too late, please advise

## 2017-07-27 NOTE — Telephone Encounter (Signed)
Called pt back-  I don't see where we called her again, I'm sorry.  However please do let us know if you don't hear about the GYN appt soon

## 2017-07-29 ENCOUNTER — Telehealth: Payer: Self-pay | Admitting: *Deleted

## 2017-07-29 NOTE — Telephone Encounter (Signed)
Copied from [place] [id number]. Topic: General - Other >> Jul 29, 2017  3:17 PM Keene Breath wrote: Reason for CRM: Patient called to let doctor know that she still has not heard from anyone regarding her referral to GYN.  Patient states that the doctor told her to call the office if she did not get a response by today.  CB# [phone].  Please advise

## 2017-08-01 ENCOUNTER — Telehealth: Payer: Self-pay | Admitting: Family Medicine

## 2017-08-01 ENCOUNTER — Ambulatory Visit (INDEPENDENT_AMBULATORY_CARE_PROVIDER_SITE_OTHER): Payer: BLUE CROSS/BLUE SHIELD | Admitting: *Deleted

## 2017-08-01 DIAGNOSIS — I639 Cerebral infarction, unspecified: Secondary | ICD-10-CM

## 2017-08-01 NOTE — Telephone Encounter (Signed)
Referral was placed by Dr. Lorelei Pont. A duplicate phone note has been routed to her office for follow-up.

## 2017-08-01 NOTE — Progress Notes (Signed)
Carelink Summary Report / Loop Recorder 

## 2017-08-01 NOTE — Telephone Encounter (Signed)
Spoke with pt states that the message was not meant for Dr [name], pt states that she left a message with Dr Lorelei Pont office and she is waiting for a call back from the office.

## 2017-08-01 NOTE — Telephone Encounter (Signed)
Copied from [place] [id number]. Topic: General - Other >> Aug 01, 2017  9:05 AM Cecelia Byars, NT wrote: Reason for CRM: Patient called to say she still has not heard from the [facility] ,office yet  ,please advise [phone]

## 2017-08-03 LAB — CUP PACEART REMOTE DEVICE CHECK
Implantable Pulse Generator Implant Date: 20170829
MDC IDC SESS DTM: 20190619101158

## 2017-08-05 ENCOUNTER — Ambulatory Visit (HOSPITAL_BASED_OUTPATIENT_CLINIC_OR_DEPARTMENT_OTHER): Payer: BLUE CROSS/BLUE SHIELD | Admitting: Physical Medicine & Rehabilitation

## 2017-08-05 ENCOUNTER — Encounter: Payer: Self-pay | Admitting: Physical Medicine & Rehabilitation

## 2017-08-05 VITALS — BP 145/86 | HR 72 | Ht 64.25 in | Wt 179.0 lb

## 2017-08-05 DIAGNOSIS — G8112 Spastic hemiplegia affecting left dominant side: Secondary | ICD-10-CM

## 2017-08-05 DIAGNOSIS — I69354 Hemiplegia and hemiparesis following cerebral infarction affecting left non-dominant side: Secondary | ICD-10-CM | POA: Diagnosis not present

## 2017-08-05 NOTE — Patient Instructions (Signed)

## 2017-08-05 NOTE — Progress Notes (Signed)
Botox Injection for spasticity using needle EMG guidance  Dilution: 50 Units/ml Indication: Severe spasticity which interferes with ADL,mobility and/or  hygiene and is unresponsive to medication management and other conservative care Informed consent was obtained after describing risks and benefits of the procedure with the patient. This includes bleeding, bruising, infection, excessive weakness, or medication side effects. A REMS form is on file and signed. Needle: 27g 1" needle electrode Number of units per muscle  Botox 100 U Left FCR 25 unit Left FDS 25 unit Left FDP 25 unit Left bicep 25 unit All injections were done after obtaining appropriate EMG activity and after negative drawback for blood. The patient tolerated the procedure well. Post procedure instructions were given. A followup appointment was made.

## 2017-08-05 NOTE — Telephone Encounter (Signed)
Contacted the office pt will be contacted today to schedule appt,

## 2017-08-05 NOTE — Telephone Encounter (Signed)
_Ptietn states she did receive a call this am from OB GYN.

## 2017-08-08 ENCOUNTER — Ambulatory Visit (INDEPENDENT_AMBULATORY_CARE_PROVIDER_SITE_OTHER): Payer: BLUE CROSS/BLUE SHIELD | Admitting: Obstetrics and Gynecology

## 2017-08-08 VITALS — BP 136/76 | HR 88 | Temp 97.9°F | Ht 64.0 in | Wt 178.6 lb

## 2017-08-08 DIAGNOSIS — N838 Other noninflammatory disorders of ovary, fallopian tube and broad ligament: Secondary | ICD-10-CM

## 2017-08-08 DIAGNOSIS — N839 Noninflammatory disorder of ovary, fallopian tube and broad ligament, unspecified: Secondary | ICD-10-CM | POA: Diagnosis not present
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
## 2017-08-09 LAB — CA 125: CANCER ANTIGEN (CA) 125: 14.2 U/mL (ref 0.0–38.1)

## 2017-08-10 NOTE — Addendum Note (Signed)
Addended by: Tarry Kos on: 08/10/2017 04:40 PM   Modules accepted: Orders

## 2017-08-12 ENCOUNTER — Telehealth: Payer: Self-pay

## 2017-08-12 NOTE — Telephone Encounter (Signed)
Dr. [name] - Please advise on pt's request for Transfer of Care. Thanks!

## 2017-08-12 NOTE — Telephone Encounter (Signed)
Copied from [place] [id number]. 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

## 2017-08-15 NOTE — Telephone Encounter (Signed)
ok 

## 2017-08-15 NOTE — Telephone Encounter (Signed)
Wendy [name] - Please advise on pt's request to transfer care. Thanks!

## 2017-08-16 NOTE — Telephone Encounter (Signed)
It looks like she has been seeing Dr.Copland most recently?

## 2017-08-17 NOTE — Telephone Encounter (Signed)
Wendy [name] - it appears, based on her chart, she started with [facility]land and switched to [facility] however has been utilizing both as PCP's. Her Care Team was updated from [place] to [facility] 05/09/17. I can reach out to the pt to try and determine who she actually wants as her PCP, if you would like. I am not sure why she would be going back and forth and now asking for another PCP...  Thanks!

## 2017-08-18 NOTE — Telephone Encounter (Signed)
Spoke with pt and she states Copland's office is too far away for her to get to and she is not satisfied with the care she has received from Dr. [name]. She would like to transfer care to Sherlene Shams, NP as that office is closer to her home.  Mickel Baas - Please advise on request to transfer. Thanks!

## 2017-08-19 ENCOUNTER — Encounter: Payer: Self-pay | Admitting: Obstetrics

## 2017-08-19 ENCOUNTER — Other Ambulatory Visit (HOSPITAL_COMMUNITY)
Admission: RE | Admit: 2017-08-19 | Discharge: 2017-08-19 | Disposition: A | Discharge status: Home / Self Care | Payer: BLUE CROSS/BLUE SHIELD | Source: Ambulatory Visit | Attending: Obstetrics | Admitting: Obstetrics

## 2017-08-19 ENCOUNTER — Ambulatory Visit: Payer: BLUE CROSS/BLUE SHIELD | Admitting: Internal Medicine

## 2017-08-19 ENCOUNTER — Inpatient Hospital Stay: Payer: BLUE CROSS/BLUE SHIELD | Attending: Obstetrics | Admitting: Obstetrics

## 2017-08-19 VITALS — BP 133/73 | HR 69 | Temp 98.5°F | Resp 20 | Ht 64.0 in | Wt 169.9 lb

## 2017-08-19 DIAGNOSIS — N83201 Unspecified ovarian cyst, right side: Secondary | ICD-10-CM | POA: Insufficient documentation

## 2017-08-19 DIAGNOSIS — R1900 Intra-abdominal and pelvic swelling, mass and lump, unspecified site: Secondary | ICD-10-CM | POA: Insufficient documentation

## 2017-08-19 NOTE — Telephone Encounter (Signed)
I am sorry- I don't think this is a good fit here.

## 2017-08-19 NOTE — Patient Instructions (Signed)
1. Pap was done and we'll let you know those results 2. Return in December with ultrasound prior for disposition
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
## 2017-08-20 ENCOUNTER — Encounter: Payer: Self-pay | Admitting: Obstetrics

## 2017-08-20 DIAGNOSIS — N83209 Unspecified ovarian cyst, unspecified side: Secondary | ICD-10-CM | POA: Insufficient documentation

## 2017-08-23 LAB — CYTOLOGY - PAP
Diagnosis: NEGATIVE
HPV (WINDOPATH): NOT DETECTED

## 2017-08-24 ENCOUNTER — Telehealth: Payer: Self-pay | Admitting: *Deleted

## 2017-08-24 NOTE — Telephone Encounter (Signed)
I called patient to tell her the results of her cytopathology report from 08/19/17.  Per Joylene John, NP, cytopathology report is normal.  Patient verbalized understanding.

## 2017-08-26 ENCOUNTER — Telehealth: Payer: Self-pay | Admitting: Neurology

## 2017-08-26 NOTE — Telephone Encounter (Signed)
Patient called stating she is having some issues with reaching her occupational therapist that Dr. Delice Lesch referred her to. She was wanting to let you know her hand hasn't gotten any better from the shots she has been received. Please call her back at [phone]. Thanks.

## 2017-08-29 NOTE — Telephone Encounter (Signed)
Per pt she would rather see a female MD. Pt has requested Dr. Deborra Medina.   Dr. Deborra Medina - Please advise on transfer request. Thank you!

## 2017-09-01 ENCOUNTER — Telehealth: Payer: Self-pay | Admitting: Neurology

## 2017-09-01 NOTE — Telephone Encounter (Signed)
Patient lmom to have you call her. Thank you

## 2017-09-01 NOTE — Telephone Encounter (Signed)
Returned call to pt.  She states that she has not been able to contact OT - had 1 appointment and was told to see Dr Gena Fray for Botox (which pt did).  Pt told me that I needed to "get in touch with them because they are slowing my progress"  Advised pt that I would send message to OT [facility].  Staff message sent.

## 2017-09-05 ENCOUNTER — Ambulatory Visit (INDEPENDENT_AMBULATORY_CARE_PROVIDER_SITE_OTHER): Payer: BLUE CROSS/BLUE SHIELD | Admitting: *Deleted

## 2017-09-05 DIAGNOSIS — Z8673 Personal history of transient ischemic attack (TIA), and cerebral infarction without residual deficits: Secondary | ICD-10-CM

## 2017-09-05 NOTE — Progress Notes (Signed)
Carelink Summary Report / Loop Recorder 

## 2017-09-06 ENCOUNTER — Encounter: Payer: Medicaid Other | Admitting: Psychology

## 2017-09-06 NOTE — Progress Notes (Deleted)
NEUROBEHAVIORAL STATUS EXAM   Name: Wendy [name] Date of Birth: 05/14/1955 Date of Interview: 09/06/2017  Reason for Referral:  Wendy [name] is a 62 y.o. left handed female who is referred for neuropsychological evaluation by Dr. Ellouise Newer of [facility] due to concerns about memory changes since strokes in 2017. This patient is accompanied in the office by her *** who supplements the history.  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 The stroke a week or so later in April 2017 involved the right MCA and was felt to be cardioembolic. Echo normal, per records, loop recorder did not show any evidence of atrial fibrillation. She was started on Keppra after the second stroke, presumably due to how she was found with saliva on side of mouth and decreased consciousness, EEG showed right frontotemporal slowing, no epileptiform discharges. No seizures or seizure-like symptoms reported, continue Keppra 1000mg  BID for now.   Onset/Course  Upon direct questioning, the patient/caregiver reported:   Forgetting recent conversations/events:  Repeating statements/questions: Misplacing/losing items: Forgetting appointments or other obligations: Forgetting to take medications:  Difficulty concentrating: Starting but not finishing tasks: Distracted easily: Processing information more slowly:  Word-finding difficulty: Word substitutions: Writing difficulty: Spelling difficulty: Comprehension difficulty:  Getting lost when driving: Making wrong turns when driving: Uncertain about directions when driving or  passenger:    Family neuro hx: Any family hx dementia?   Current Functioning: Work:  Complex ADLs Driving: Medication management: Management of finances: Appointments: Cooking:     Medical/Physical complaints:  Any hx of stroke/TIA, MI, LOC/TBI, Sz? Hx falls?  Balance, probs walking? Sleep: Insomnia? OSA? CPAP? REM sleep beh sx? Visual illusions/hallucinations? Appetite/Nutrition/Weight changes       Current mood:  Depressed mood Anxiety Stress  Behavioral disturbance/Personality change  Suicidal Ideation/Intention:   Psychiatric History: History of depression, anxiety, other MH disorder: History of MH treatment: History of SI: History of substance dependence/treatment:   Social History: Born/Raised:  Education: Occupational history: Marital history: Children: Alcohol:  Tobacco: SA:  Medical History: Past Medical History:  Diagnosis Date  . Blood transfusion without reported diagnosis   . Chicken pox   . Cough   . Depression   . Helicobacter pylori gastritis    treated 05/2017  . Hyperlipidemia   . Hypertension   . Left-sided weakness   . Stroke ([place]) 03/2015 and 04/2015   Pt had 2 strokes/weakness on the left side  . Type 2 diabetes mellitus ([place])    Patient states she is a boader line diabetic      Current Medications:  Outpatient Encounter Medications as of 09/06/2017  Medication Sig  . amLODipine (NORVASC) 10 MG tablet Take 1 tablet (10 mg total) by mouth daily.  Marland Kitchen aspirin EC 81 MG tablet Take 81 mg by mouth daily.  Marland Kitchen dextromethorphan (DELSYM) 30 MG/5ML liquid 2 tsp every 12 hours as needed  . famotidine (PEPCID) 20 MG tablet Take 1 tablet (20 mg total) by mouth at bedtime.  . levETIRAcetam (KEPPRA) 1000 MG tablet Take 1 tablet (1,000 mg total) by mouth 2 (two) times daily.  . metFORMIN (GLUCOPHAGE) 1000 MG tablet Take 1 tablet (1,000 mg total) by mouth  2 (two) times daily with a meal.  . montelukast (SINGULAIR) 10 MG tablet  Take 1 tablet (10 mg total) by mouth at bedtime.  Marland Kitchen omeprazole (PRILOSEC) 40 MG capsule Take 1 capsule (40 mg total) by mouth daily before breakfast. 20 to 30 minutes before breakfast on an empty stomach.  . rosuvastatin (CRESTOR) 20 MG tablet Take 1 tablet (20 mg total) by mouth daily.   No facility-administered encounter medications on file as of 09/06/2017.      Behavioral Observations:   Appearance: Neatly, casually and appropriately dressed and groomed*** Gait: Ambulated independently, no gross abnormalities observed*** Speech: Fluent; normal rate, rhythm and volume. *** word finding difficulty. Thought process: Linear, goal directed*** Affect: Full, anxious*** Interpersonal: Pleasant, appropriate***   *** minutes spent face-to-face with patient completing neurobehavioral status exam. *** minutes spent integrating medical records/clinical data and completing this report. [id number] unit; [id number]***.   TESTING: There is medical necessity to proceed with neuropsychological assessment as the results will be used to aid in differential diagnosis and clinical decision-making and to inform specific treatment recommendations. Per the patient, *** and medical records reviewed, there has been a change in cognitive functioning and a reasonable suspicion of dementia***.  Clinical Decision Making: In considering the patient's current level of functioning, level of presumed impairment, nature of symptoms, emotional and behavioral responses during the interview, level of literacy, and observed level of motivation, a battery of tests was selected and communicated to the psychometrician.   ***Option 1:  Following the clinical interview/neurobehavioral status exam, the patient completed this full battery of neuropsychological testing with my psychometrician under my supervision (see separate note).   PLAN: The patient will return to see me for a follow-up session at which time her test performances  and my impressions and treatment recommendations will be reviewed in detail.  Evaluation ongoing; full report to follow.  ***Option 2:  PLAN: The patient will return to complete the above referenced full battery of neuropsychological testing with a psychometrician under my supervision. Education regarding testing procedures was provided to the patient. Subsequently, the patient will see this provider for a follow-up session at which time her test performances and my impressions and treatment recommendations will be reviewed in detail.  Evaluation ongoing; full report to follow.

## 2017-09-08 ENCOUNTER — Ambulatory Visit: Payer: BLUE CROSS/BLUE SHIELD | Attending: Neurology | Admitting: Occupational Therapy

## 2017-09-08 DIAGNOSIS — I69352 Hemiplegia and hemiparesis following cerebral infarction affecting left dominant side: Secondary | ICD-10-CM | POA: Diagnosis not present

## 2017-09-08 NOTE — Patient Instructions (Signed)
   Your Splint This splint should initially be fitted by a healthcare practitioner.  The healthcare practitioner is responsible for providing wearing instructions and precautions to the patient, other healthcare practitioners and care provider involved in the patient's care.  This splint was custom made for you. Please read the following instructions to learn about wearing and caring for your splint.  Precautions Should your splint cause any of the following problems, remove the splint immediately and contact your therapist/physician.  Swelling  Severe Pain  Pressure Areas  Stiffness  Numbness  Do not wear your splint while operating machinery unless it has been fabricated for that purpose.  When To Wear Your Splint Where your splint according to your therapist/physician instructions. At night (Begin by wearing 2-3 hours during the day and then take off and monitor skin, if no problems, increase to 4-5 hours during the day. Once you can tolerate 5 hours, then switch to wearing only at night. This may take 2-3 days)  Care and Cleaning of Your Splint 1. Keep your splint away from open flames. 2. Your splint will lose its shape in temperatures over 135 degrees Farenheit, ( in car windows, near radiators, ovens or in hot water).  Never make any adjustments to your splint, if the splint needs adjusting remove it and make an appointment to see your therapist. 3. Your splint  may be cleaned with rubbing alcohol.  Do not immerse in hot water over 135 degrees Farenheit.
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
## 2017-09-14 ENCOUNTER — Ambulatory Visit: Payer: BLUE CROSS/BLUE SHIELD | Attending: Neurology | Admitting: Occupational Therapy

## 2017-09-14 DIAGNOSIS — M25612 Stiffness of left shoulder, not elsewhere classified: Secondary | ICD-10-CM

## 2017-09-14 DIAGNOSIS — I69352 Hemiplegia and hemiparesis following cerebral infarction affecting left dominant side: Secondary | ICD-10-CM | POA: Diagnosis present

## 2017-09-14 DIAGNOSIS — Z8673 Personal history of transient ischemic attack (TIA), and cerebral infarction without residual deficits: Secondary | ICD-10-CM | POA: Insufficient documentation

## 2017-09-14 DIAGNOSIS — G8929 Other chronic pain: Secondary | ICD-10-CM | POA: Diagnosis present

## 2017-09-14 DIAGNOSIS — M25512 Pain in left shoulder: Secondary | ICD-10-CM | POA: Diagnosis present

## 2017-09-14 NOTE — Patient Instructions (Addendum)
1. Flexion (Assistive)    Laying down, hold Lt wrist with Rt hand (THUMB SIDE UP) and raise Lt arm above head, keeping elbows as straight as possible.  Repeat _10___ times. Do _2___ sessions per day.   2. Cranial Flexion: Overhead Arm Extension - Supine    Lie with knees bent, arms beyond head, holding beach ball, palms facing each other (can use shoebox if you don't have beach ball). Pull ball up to above face. Repeat _10___ times per set. Do __2__ sets per session.            3. Flip large cards over with Lt hand (keep shoulder down).  4. Stack/unstack cups 5. Bring cup to mouth and replace back on table, release fully before pulling hand away.  6. Pick up cotton balls and put in cup 7. Wipe table with Lt hand 8. Fold paper    DO NOT USE LT HAND FOR ANYTHING SHARP, HEAVY, BREAKABLE, OR HOT
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
## 2017-09-15 ENCOUNTER — Ambulatory Visit: Payer: BLUE CROSS/BLUE SHIELD | Admitting: Occupational Therapy

## 2017-09-15 DIAGNOSIS — I69352 Hemiplegia and hemiparesis following cerebral infarction affecting left dominant side: Secondary | ICD-10-CM

## 2017-09-15 DIAGNOSIS — M25612 Stiffness of left shoulder, not elsewhere classified: Secondary | ICD-10-CM
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
## 2017-09-16 ENCOUNTER — Other Ambulatory Visit: Payer: Self-pay | Admitting: Family Medicine

## 2017-09-16 ENCOUNTER — Encounter: Payer: Self-pay | Admitting: Physical Medicine & Rehabilitation

## 2017-09-16 ENCOUNTER — Telehealth: Payer: Self-pay | Admitting: Family Medicine

## 2017-09-16 ENCOUNTER — Encounter: Payer: BLUE CROSS/BLUE SHIELD | Attending: Physical Medicine & Rehabilitation

## 2017-09-16 ENCOUNTER — Ambulatory Visit (HOSPITAL_BASED_OUTPATIENT_CLINIC_OR_DEPARTMENT_OTHER): Payer: BLUE CROSS/BLUE SHIELD | Admitting: Physical Medicine & Rehabilitation

## 2017-09-16 VITALS — BP 123/81 | HR 79 | Ht 64.0 in | Wt 178.0 lb

## 2017-09-16 DIAGNOSIS — E119 Type 2 diabetes mellitus without complications: Secondary | ICD-10-CM | POA: Insufficient documentation

## 2017-09-16 DIAGNOSIS — Z87891 Personal history of nicotine dependence: Secondary | ICD-10-CM | POA: Diagnosis not present

## 2017-09-16 DIAGNOSIS — I10 Essential (primary) hypertension: Secondary | ICD-10-CM | POA: Insufficient documentation

## 2017-09-16 DIAGNOSIS — G8112 Spastic hemiplegia affecting left dominant side: Secondary | ICD-10-CM

## 2017-09-16 DIAGNOSIS — I69354 Hemiplegia and hemiparesis following cerebral infarction affecting left non-dominant side: Secondary | ICD-10-CM | POA: Insufficient documentation

## 2017-09-16 DIAGNOSIS — E785 Hyperlipidemia, unspecified: Secondary | ICD-10-CM | POA: Insufficient documentation

## 2017-09-16 DIAGNOSIS — Z800 Family history of malignant neoplasm of digestive organs: Secondary | ICD-10-CM | POA: Diagnosis not present

## 2017-09-16 DIAGNOSIS — Z8249 Family history of ischemic heart disease and other diseases of the circulatory system: Secondary | ICD-10-CM | POA: Insufficient documentation

## 2017-09-16 LAB — CUP PACEART REMOTE DEVICE CHECK
Date Time Interrogation Session: 20190722100604
MDC IDC PG IMPLANT DT: 20170829

## 2017-09-16 NOTE — Patient Instructions (Signed)
Hydrocortisone cream or ointment 1% apply 3 times a day to right forearm

## 2017-09-16 NOTE — Progress Notes (Signed)
62 yo female with hx of Left spastic hemi due to stroke  Had Botox 08/05/2017, pt now opening hand without physical assist.  Pt has been using Left hand splint  Botox 100 U Left FCR 25 unit Left FDS 25 unit Left FDP 25 unit Left bicep 25 unit  Has resumed Outpt OT, and has visits scheduled until the end of the month  Examination General no acute distress Mood and affect appropriate Left upper extremity has asked with grade 0 finger flexors 0 at the elbow flexors. She is able to actively extend and flex her fingers 3/5. Bicep tricep strength 4/5 Right forearm has erythematous areas on the dorsal surface. Ambulates a cane.  There is no bicep flexion during ambulation  1.  Left spastic hemiplegia with improvements in left finger flexor tone elbow flexor tone status post botulinum toxin injection performed 08/05/2017. See image in media She will continue outpatient OT and to use her splint to wear at night. We will see her back in approximately 2 months at which point will reassess her tone, if it is returning to preinjection levels will schedule for repeat Discussed with patient agrees with plan

## 2017-09-16 NOTE — Telephone Encounter (Signed)
Copied from [facility] [id number]. Topic: Quick Communication - Rx Refill/Question >> Sep 16, 2017  2:24 PM Wynetta Emery, [place] C wrote: Medication: Simvastatin (not showing on medication list ) pt says that [facility] prescribed.   Has the patient contacted their pharmacy? Yes   (Agent: If no, request that the patient contact the pharmacy for the refill.) (Agent: If yes, when and what did the pharmacy advise?)  Preferred Pharmacy (with phone number or street name): [facility], [place] - [address] [phone] (Phone) [phone] (Fax)    Agent: Please be advised that RX refills may take up to 3 business days. We ask that you follow-up with your pharmacy.

## 2017-09-16 NOTE — Telephone Encounter (Signed)
Copied from [facility] [id number]. Topic: Quick Communication - Rx Refill/Question >> Sep 16, 2017  4:16 PM Keene Breath wrote: Medication: rosuvastatin (CRESTOR) 20 MG tablet  Patient called to request a refill for the above medication.  CB# [phone]  Preferred Pharmacy (with phone number or street name): [facility], [place] - [address] [phone] (Phone) [phone] (Fax)

## 2017-09-16 NOTE — Telephone Encounter (Signed)
LOV 06/21/17 Dr. [name] See pt. Request.

## 2017-09-19 ENCOUNTER — Telehealth: Payer: Self-pay

## 2017-09-19 MED ORDER — ROSUVASTATIN CALCIUM 20 MG PO TABS: 20.0000 mg | ORAL_TABLET | Freq: Every day | ORAL | 1 refills | Status: DC

## 2017-09-19 NOTE — Telephone Encounter (Signed)
Not on patient's current medication list. Please advise.  

## 2017-09-19 NOTE — Telephone Encounter (Signed)
Copied from [facility] [id number]. Topic: Quick Communication - See Telephone Encounter >> Sep 16, 2017  2:37 PM Antonieta Iba C wrote: CRM for notification. See Telephone encounter for: 09/16/17.  FYI for call back:   Pt called in to request a refill on her simvastatin medication. Sent request although not showing on medication list. Pt says that Dr. Janett Billow [facility] prescribed although she is a [facility] pt. Pt asked if she would receive refill today? Advised pt of refill turn around time, and that Copland is out of the office today. Pt began to be very rude stating that she don't want to go without her medication and requested to speak with Kennyth Lose in the office (HP) implying that she can do more that I am able. Kennyth Lose was currently with a pt. Pt is requesting a call back from [place].   [phone]

## 2017-09-19 NOTE — Telephone Encounter (Signed)
Called her back- she is on crestor, does not need to be on simvastatin.  She seems to have been confused about simvastatin vs rosuvastatin.  She also has requested to transfer care to another provider so she will need to get RF from that provider

## 2017-09-19 NOTE — Telephone Encounter (Signed)
Received request for crestor as well? Please advise.

## 2017-09-19 NOTE — Addendum Note (Signed)
Addended by: Lamar Blinks C on: 09/19/2017 05:42 PM   Modules accepted: Orders

## 2017-09-20 ENCOUNTER — Other Ambulatory Visit: Payer: Self-pay

## 2017-09-20 ENCOUNTER — Ambulatory Visit: Payer: BLUE CROSS/BLUE SHIELD | Admitting: *Deleted

## 2017-09-20 ENCOUNTER — Encounter: Payer: Self-pay | Admitting: *Deleted

## 2017-09-20 DIAGNOSIS — M25512 Pain in left shoulder: Secondary | ICD-10-CM

## 2017-09-20 DIAGNOSIS — I69352 Hemiplegia and hemiparesis following cerebral infarction affecting left dominant side: Secondary | ICD-10-CM

## 2017-09-20 DIAGNOSIS — M25612 Stiffness of left shoulder, not elsewhere classified: Secondary | ICD-10-CM

## 2017-09-20 DIAGNOSIS — Z8673 Personal history of transient ischemic attack (TIA), and cerebral infarction without residual deficits: Secondary | ICD-10-CM

## 2017-09-20 DIAGNOSIS — G8929 Other chronic pain: Secondary | ICD-10-CM
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
## 2017-09-20 NOTE — Telephone Encounter (Signed)
Spoke with pt states that she is no longer taking Simvastatin, currently she takes Rosuvastatin, pt has enough refills for this Rx

## 2017-09-21 ENCOUNTER — Ambulatory Visit: Payer: BLUE CROSS/BLUE SHIELD | Admitting: Occupational Therapy

## 2017-09-21 NOTE — Telephone Encounter (Signed)
We have not received decision on pt transfer request. Thanks!

## 2017-09-26 ENCOUNTER — Telehealth: Payer: Self-pay

## 2017-09-26 NOTE — Telephone Encounter (Signed)
Please advise if ok to place in a referral for pt

## 2017-09-26 NOTE — Telephone Encounter (Signed)
CRM # [id number]  Owner: None  Status: Unresolved  Priority: Routine Created on: 09/26/2017 03:23 PM By: Janace Aris A  Primary Information   Source Subject Topic  Wendy [name], Wendy [name] (Patient) Shipes, Wendy [name] (Patient) General - Other  Summary: Transfer of Care.  Reason for CRM: Patient is requesting to transfer her care over to the [facility] Primary care office. Patient is saying she is dissatisfied with the care she was receiving from her PCP. Says she no longer wants to be a patient at this office.     Please reach out to Patient to see what we can do.

## 2017-09-26 NOTE — Telephone Encounter (Signed)
Copied from [place]  [phone]. Topic: Referral - Request >> Sep 26, 2017  2:59 PM Wendy [name] wrote:  Pt call to say she is still having arm pain and would like to see a orthopedic doctor to see what is causing the pain

## 2017-09-27 ENCOUNTER — Encounter: Payer: Self-pay | Admitting: Family Medicine

## 2017-09-27 ENCOUNTER — Ambulatory Visit (INDEPENDENT_AMBULATORY_CARE_PROVIDER_SITE_OTHER): Payer: BLUE CROSS/BLUE SHIELD | Admitting: Family Medicine

## 2017-09-27 ENCOUNTER — Ambulatory Visit: Payer: BLUE CROSS/BLUE SHIELD | Admitting: Occupational Therapy

## 2017-09-27 VITALS — BP 130/70 | HR 68 | Temp 98.6°F | Resp 12 | Ht 64.0 in | Wt 177.5 lb

## 2017-09-27 DIAGNOSIS — M79602 Pain in left arm: Secondary | ICD-10-CM

## 2017-09-27 DIAGNOSIS — Z23 Encounter for immunization: Secondary | ICD-10-CM | POA: Diagnosis not present

## 2017-09-27 DIAGNOSIS — M25512 Pain in left shoulder: Secondary | ICD-10-CM

## 2017-09-27 NOTE — Patient Instructions (Signed)
A few things to remember from today's visit:   Left shoulder pain, unspecified chronicity - Plan: Ambulatory referral to Orthopedic Surgery  Left-sided weakness  Appointment with orthopedics will be arranged as requested. Continue range of motion exercises.  Please be sure medication list is accurate. If a new problem present, please set up appointment sooner than planned today.
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
## 2017-09-28 ENCOUNTER — Ambulatory Visit: Payer: BLUE CROSS/BLUE SHIELD | Admitting: Occupational Therapy

## 2017-09-28 NOTE — Telephone Encounter (Signed)
Referral was placed this wk.  Pt was seen in office.

## 2017-09-29 ENCOUNTER — Ambulatory Visit: Payer: BLUE CROSS/BLUE SHIELD | Admitting: Occupational Therapy

## 2017-09-29 DIAGNOSIS — M25612 Stiffness of left shoulder, not elsewhere classified: Secondary | ICD-10-CM

## 2017-09-29 DIAGNOSIS — I69352 Hemiplegia and hemiparesis following cerebral infarction affecting left dominant side: Secondary | ICD-10-CM
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
## 2017-10-04 ENCOUNTER — Ambulatory Visit: Payer: BLUE CROSS/BLUE SHIELD | Admitting: Occupational Therapy

## 2017-10-04 LAB — CUP PACEART REMOTE DEVICE CHECK
Date Time Interrogation Session: 20190824100911
MDC IDC PG IMPLANT DT: 20170829

## 2017-10-05 ENCOUNTER — Ambulatory Visit: Payer: BLUE CROSS/BLUE SHIELD | Admitting: Occupational Therapy

## 2017-10-06 ENCOUNTER — Ambulatory Visit: Payer: BLUE CROSS/BLUE SHIELD | Admitting: Occupational Therapy

## 2017-10-06 ENCOUNTER — Ambulatory Visit (INDEPENDENT_AMBULATORY_CARE_PROVIDER_SITE_OTHER): Payer: BLUE CROSS/BLUE SHIELD | Admitting: *Deleted

## 2017-10-06 DIAGNOSIS — I639 Cerebral infarction, unspecified: Secondary | ICD-10-CM | POA: Diagnosis not present

## 2017-10-06 NOTE — Progress Notes (Signed)
Remote pacemaker transmission.   

## 2017-10-10 ENCOUNTER — Telehealth: Payer: Self-pay | Admitting: *Deleted

## 2017-10-10 LAB — CUP PACEART REMOTE DEVICE CHECK
Date Time Interrogation Session: 20190926134011
MDC IDC PG IMPLANT DT: 20170829

## 2017-10-10 NOTE — Telephone Encounter (Signed)
Agree with Dr. Victorino December recommendations

## 2017-10-10 NOTE — Telephone Encounter (Signed)
Spoke with patient regarding tachy episode from 10/08/17 at 2231, duration 6sec. ECG appears WCT. Patient reports she was awake at the time, asymptomatic with episode. Taking cardiac meds as prescribed. Advised that Dr. Sallyanne Kuster will review episode and I will call her back if any recommendations. Patient denies questions or concerns at this time.

## 2017-10-10 NOTE — Telephone Encounter (Signed)
The episode is highly compatible with ventricular tachycardia.  Normal left ventricular systolic function by echo 2017.  No history of coronary insufficiency.  Device implanted for cryptogenic stroke.  Since the episode was asymptomatic, we will continue monitoring.

## 2017-10-11 ENCOUNTER — Ambulatory Visit: Payer: Medicare Other | Attending: Neurology | Admitting: Occupational Therapy

## 2017-10-11 DIAGNOSIS — M25612 Stiffness of left shoulder, not elsewhere classified: Secondary | ICD-10-CM | POA: Diagnosis not present

## 2017-10-11 DIAGNOSIS — I69352 Hemiplegia and hemiparesis following cerebral infarction affecting left dominant side: Secondary | ICD-10-CM | POA: Diagnosis not present
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
## 2017-10-13 ENCOUNTER — Ambulatory Visit (INDEPENDENT_AMBULATORY_CARE_PROVIDER_SITE_OTHER): Payer: Medicare Other | Admitting: Orthopedic Surgery

## 2017-10-13 ENCOUNTER — Encounter (INDEPENDENT_AMBULATORY_CARE_PROVIDER_SITE_OTHER): Payer: Self-pay | Admitting: Orthopedic Surgery

## 2017-10-13 ENCOUNTER — Ambulatory Visit: Payer: Medicare Other | Admitting: Occupational Therapy

## 2017-10-13 DIAGNOSIS — I639 Cerebral infarction, unspecified: Secondary | ICD-10-CM | POA: Diagnosis not present

## 2017-10-13 DIAGNOSIS — I69352 Hemiplegia and hemiparesis following cerebral infarction affecting left dominant side: Secondary | ICD-10-CM | POA: Diagnosis not present

## 2017-10-13 DIAGNOSIS — M7542 Impingement syndrome of left shoulder: Secondary | ICD-10-CM

## 2017-10-13 DIAGNOSIS — M25612 Stiffness of left shoulder, not elsewhere classified: Secondary | ICD-10-CM

## 2017-10-13 NOTE — Patient Instructions (Addendum)
  STEPS TO MAKE SURE YOU ARE SAFE TO RETURN TO DRIVING:   1) Discuss w/ Dr. Delice Lesch first, then if she feels you could potentially drive short distances....  2) Check reaction time and vision including peripheral vision (this may need to be done at the MD office and/or DMV). Consider driving evaluation by certified O.T.   3) If you pass all the above, then only drive short distances in LOW traffic times and LOW speeds (absolutely NO interstate or night time driving)
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
## 2017-10-14 ENCOUNTER — Encounter (INDEPENDENT_AMBULATORY_CARE_PROVIDER_SITE_OTHER): Payer: Self-pay | Admitting: Orthopedic Surgery

## 2017-10-14 DIAGNOSIS — M7542 Impingement syndrome of left shoulder: Secondary | ICD-10-CM

## 2017-10-14 DIAGNOSIS — I639 Cerebral infarction, unspecified: Secondary | ICD-10-CM | POA: Diagnosis not present

## 2017-10-14 MED ORDER — LIDOCAINE HCL 1 % IJ SOLN
5.0000 mL | INTRAMUSCULAR | Status: AC | PRN
  Administered 2017-10-14: 5 mL

## 2017-10-14 MED ORDER — BUPIVACAINE HCL 0.5 % IJ SOLN
9.0000 mL | INTRAMUSCULAR | Status: AC | PRN
  Administered 2017-10-14: 9 mL via INTRA_ARTICULAR

## 2017-10-14 MED ORDER — METHYLPREDNISOLONE ACETATE 40 MG/ML IJ SUSP
40.0000 mg | INTRAMUSCULAR | Status: AC | PRN
  Administered 2017-10-14: 40 mg via INTRA_ARTICULAR
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
## 2017-10-20 ENCOUNTER — Encounter: Payer: Medicaid Other | Admitting: Psychology

## 2017-11-08 ENCOUNTER — Ambulatory Visit: Payer: Medicaid Other | Admitting: Neurology

## 2017-11-08 ENCOUNTER — Ambulatory Visit (INDEPENDENT_AMBULATORY_CARE_PROVIDER_SITE_OTHER): Payer: Medicare Other | Admitting: *Deleted

## 2017-11-08 DIAGNOSIS — I639 Cerebral infarction, unspecified: Secondary | ICD-10-CM

## 2017-11-08 NOTE — Progress Notes (Signed)
Carelink Summary Report / Loop Recorder 

## 2017-11-11 ENCOUNTER — Encounter: Payer: Medicare Other | Attending: Physical Medicine & Rehabilitation

## 2017-11-11 ENCOUNTER — Ambulatory Visit (HOSPITAL_BASED_OUTPATIENT_CLINIC_OR_DEPARTMENT_OTHER): Payer: BLUE CROSS/BLUE SHIELD | Admitting: Physical Medicine & Rehabilitation

## 2017-11-11 ENCOUNTER — Encounter: Payer: Self-pay | Admitting: Physical Medicine & Rehabilitation

## 2017-11-11 ENCOUNTER — Encounter: Payer: Self-pay | Admitting: Neurology

## 2017-11-11 ENCOUNTER — Ambulatory Visit (INDEPENDENT_AMBULATORY_CARE_PROVIDER_SITE_OTHER): Payer: Medicare Other | Admitting: Neurology

## 2017-11-11 VITALS — BP 122/74 | HR 72 | Ht 64.0 in | Wt 174.0 lb

## 2017-11-11 VITALS — BP 123/82 | HR 69 | Resp 14 | Ht 64.0 in | Wt 174.0 lb

## 2017-11-11 DIAGNOSIS — I10 Essential (primary) hypertension: Secondary | ICD-10-CM | POA: Insufficient documentation

## 2017-11-11 DIAGNOSIS — G8112 Spastic hemiplegia affecting left dominant side: Secondary | ICD-10-CM

## 2017-11-11 DIAGNOSIS — E119 Type 2 diabetes mellitus without complications: Secondary | ICD-10-CM | POA: Insufficient documentation

## 2017-11-11 DIAGNOSIS — I69354 Hemiplegia and hemiparesis following cerebral infarction affecting left non-dominant side: Secondary | ICD-10-CM | POA: Diagnosis not present

## 2017-11-11 DIAGNOSIS — Z8249 Family history of ischemic heart disease and other diseases of the circulatory system: Secondary | ICD-10-CM | POA: Diagnosis not present

## 2017-11-11 DIAGNOSIS — M549 Dorsalgia, unspecified: Secondary | ICD-10-CM | POA: Diagnosis not present

## 2017-11-11 DIAGNOSIS — Z87891 Personal history of nicotine dependence: Secondary | ICD-10-CM | POA: Diagnosis not present

## 2017-11-11 DIAGNOSIS — Z800 Family history of malignant neoplasm of digestive organs: Secondary | ICD-10-CM | POA: Insufficient documentation

## 2017-11-11 DIAGNOSIS — G40209 Localization-related (focal) (partial) symptomatic epilepsy and epileptic syndromes with complex partial seizures, not intractable, without status epilepticus: Secondary | ICD-10-CM

## 2017-11-11 DIAGNOSIS — I639 Cerebral infarction, unspecified: Secondary | ICD-10-CM

## 2017-11-11 DIAGNOSIS — E785 Hyperlipidemia, unspecified: Secondary | ICD-10-CM | POA: Diagnosis not present

## 2017-11-11 MED ORDER — LEVETIRACETAM 1000 MG PO TABS: 1000.0000 mg | ORAL_TABLET | Freq: Two times a day (BID) | ORAL | 3 refills | Status: DC
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
## 2017-11-11 NOTE — Patient Instructions (Signed)

## 2017-11-11 NOTE — Progress Notes (Signed)
Botox Injection for spasticity using needle EMG guidance  Dilution: 50 Units/ml Indication: Severe spasticity which interferes with ADL,mobility and/or  hygiene and is unresponsive to medication management and other conservative care Informed consent was obtained after describing risks and benefits of the procedure with the patient. This includes bleeding, bruising, infection, excessive weakness, or medication side effects. A REMS form is on file and signed. Needle: 27g 1" needle electrode Number of units per muscle  Biceps25  FDS50 (37.5 to index finger fascicles) FDP0 FPL25  All injections were done after obtaining appropriate EMG activity and after negative drawback for blood. The patient tolerated the procedure well. Post procedure instructions were given. A followup appointment was made.
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
## 2017-11-17 ENCOUNTER — Other Ambulatory Visit: Payer: Self-pay

## 2017-11-17 DIAGNOSIS — M549 Dorsalgia, unspecified: Secondary | ICD-10-CM

## 2017-11-17 DIAGNOSIS — R531 Weakness: Secondary | ICD-10-CM

## 2017-11-30 LAB — CUP PACEART REMOTE DEVICE CHECK
Date Time Interrogation Session: 20191029143828
MDC IDC PG IMPLANT DT: 20170829

## 2017-12-01 ENCOUNTER — Ambulatory Visit: Payer: Medicare Other | Attending: Neurology | Admitting: Physical Therapy

## 2017-12-01 ENCOUNTER — Encounter: Payer: Self-pay | Admitting: Physical Therapy

## 2017-12-01 ENCOUNTER — Other Ambulatory Visit: Payer: Self-pay

## 2017-12-01 DIAGNOSIS — G8929 Other chronic pain: Secondary | ICD-10-CM | POA: Insufficient documentation

## 2017-12-01 DIAGNOSIS — M545 Low back pain: Secondary | ICD-10-CM | POA: Diagnosis not present

## 2017-12-01 DIAGNOSIS — Z8673 Personal history of transient ischemic attack (TIA), and cerebral infarction without residual deficits: Secondary | ICD-10-CM | POA: Diagnosis not present

## 2017-12-01 DIAGNOSIS — R262 Difficulty in walking, not elsewhere classified: Secondary | ICD-10-CM | POA: Diagnosis not present

## 2017-12-01 NOTE — Patient Instructions (Signed)
Access Code: [email or web address]  URL: [email or web address]  Date: 12/01/2017  Prepared by: Barry Brunner   Exercises  Hooklying Sacroiliac Joint Isometric - 3 reps - 1 sets - 6 seconds hold - 2x daily - 7x weekly  hamstring contraction - 3 reps - 1 sets - 6 hold - 2x daily - 7x weekly  Patient Education  Sacroiliac Joint Dysfunction
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
## 2017-12-06 ENCOUNTER — Ambulatory Visit: Payer: Medicare Other | Admitting: Physical Therapy

## 2017-12-06 DIAGNOSIS — G8929 Other chronic pain: Secondary | ICD-10-CM | POA: Diagnosis not present

## 2017-12-06 DIAGNOSIS — Z8673 Personal history of transient ischemic attack (TIA), and cerebral infarction without residual deficits: Secondary | ICD-10-CM | POA: Diagnosis not present

## 2017-12-06 DIAGNOSIS — R262 Difficulty in walking, not elsewhere classified: Secondary | ICD-10-CM

## 2017-12-06 DIAGNOSIS — M545 Low back pain, unspecified: Secondary | ICD-10-CM
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
## 2017-12-12 ENCOUNTER — Ambulatory Visit (INDEPENDENT_AMBULATORY_CARE_PROVIDER_SITE_OTHER): Payer: Medicare Other

## 2017-12-12 DIAGNOSIS — I639 Cerebral infarction, unspecified: Secondary | ICD-10-CM

## 2017-12-12 NOTE — Progress Notes (Signed)
Carelink Summary Report / Loop Recorder 

## 2017-12-14 ENCOUNTER — Ambulatory Visit: Payer: Medicare Other | Admitting: Physical Therapy

## 2017-12-14 ENCOUNTER — Encounter: Payer: Self-pay | Admitting: Physical Therapy

## 2017-12-14 DIAGNOSIS — M545 Low back pain, unspecified: Secondary | ICD-10-CM

## 2017-12-14 DIAGNOSIS — Z800 Family history of malignant neoplasm of digestive organs: Secondary | ICD-10-CM | POA: Insufficient documentation

## 2017-12-14 DIAGNOSIS — G8929 Other chronic pain: Secondary | ICD-10-CM | POA: Insufficient documentation

## 2017-12-14 DIAGNOSIS — E785 Hyperlipidemia, unspecified: Secondary | ICD-10-CM | POA: Diagnosis not present

## 2017-12-14 DIAGNOSIS — I69354 Hemiplegia and hemiparesis following cerebral infarction affecting left non-dominant side: Secondary | ICD-10-CM | POA: Insufficient documentation

## 2017-12-14 DIAGNOSIS — R262 Difficulty in walking, not elsewhere classified: Secondary | ICD-10-CM | POA: Insufficient documentation

## 2017-12-14 DIAGNOSIS — Z87891 Personal history of nicotine dependence: Secondary | ICD-10-CM | POA: Diagnosis not present

## 2017-12-14 DIAGNOSIS — Z8249 Family history of ischemic heart disease and other diseases of the circulatory system: Secondary | ICD-10-CM | POA: Insufficient documentation

## 2017-12-14 DIAGNOSIS — I10 Essential (primary) hypertension: Secondary | ICD-10-CM | POA: Insufficient documentation

## 2017-12-14 DIAGNOSIS — E119 Type 2 diabetes mellitus without complications: Secondary | ICD-10-CM | POA: Insufficient documentation

## 2017-12-14 NOTE — Patient Instructions (Signed)
Access Code: [email or web address]  URL: [email or web address]  Date: 12/14/2017  Prepared by: Barry Brunner   Exercises  Hooklying Sacroiliac Joint Isometric - 3 reps - 1 sets - 6 seconds hold - 2x daily - 7x weekly  hamstring contraction - 3 reps - 1 sets - 6 hold - 2x daily - 7x weekly  90/90 SI Joint Self-Correction with Dowel - 10 reps - 1 sets - 3 hold - 2x daily - 7x weekly
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
## 2017-12-16 ENCOUNTER — Encounter: Payer: Self-pay | Admitting: Physical Therapy

## 2017-12-16 ENCOUNTER — Ambulatory Visit: Payer: Medicare Other | Admitting: Physical Therapy

## 2017-12-16 DIAGNOSIS — M545 Low back pain, unspecified: Secondary | ICD-10-CM

## 2017-12-16 DIAGNOSIS — I10 Essential (primary) hypertension: Secondary | ICD-10-CM | POA: Diagnosis not present

## 2017-12-16 DIAGNOSIS — Z87891 Personal history of nicotine dependence: Secondary | ICD-10-CM | POA: Diagnosis not present

## 2017-12-16 DIAGNOSIS — Z8249 Family history of ischemic heart disease and other diseases of the circulatory system: Secondary | ICD-10-CM | POA: Diagnosis not present

## 2017-12-16 DIAGNOSIS — G8929 Other chronic pain: Secondary | ICD-10-CM

## 2017-12-16 DIAGNOSIS — E785 Hyperlipidemia, unspecified: Secondary | ICD-10-CM | POA: Diagnosis not present

## 2017-12-16 DIAGNOSIS — E119 Type 2 diabetes mellitus without complications: Secondary | ICD-10-CM | POA: Diagnosis not present

## 2017-12-16 DIAGNOSIS — I69354 Hemiplegia and hemiparesis following cerebral infarction affecting left non-dominant side: Secondary | ICD-10-CM | POA: Diagnosis not present
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
## 2017-12-20 ENCOUNTER — Ambulatory Visit: Payer: Medicare Other | Admitting: Physical Therapy

## 2017-12-21 ENCOUNTER — Ambulatory Visit: Payer: Medicare Other | Admitting: Physical Therapy

## 2017-12-22 ENCOUNTER — Ambulatory Visit: Payer: Medicare Other | Admitting: Physical Therapy

## 2017-12-23 ENCOUNTER — Encounter: Payer: Medicare Other | Attending: Physical Medicine & Rehabilitation

## 2017-12-23 ENCOUNTER — Other Ambulatory Visit: Payer: Self-pay | Admitting: Family Medicine

## 2017-12-23 ENCOUNTER — Ambulatory Visit: Payer: BLUE CROSS/BLUE SHIELD | Admitting: Physical Medicine & Rehabilitation

## 2017-12-23 ENCOUNTER — Encounter: Payer: Self-pay | Admitting: Physical Therapy

## 2017-12-23 ENCOUNTER — Ambulatory Visit: Payer: Medicare Other | Admitting: Physical Therapy

## 2017-12-23 DIAGNOSIS — M545 Low back pain, unspecified: Secondary | ICD-10-CM

## 2017-12-23 DIAGNOSIS — R262 Difficulty in walking, not elsewhere classified: Secondary | ICD-10-CM

## 2017-12-23 DIAGNOSIS — I69354 Hemiplegia and hemiparesis following cerebral infarction affecting left non-dominant side: Secondary | ICD-10-CM | POA: Diagnosis not present

## 2017-12-23 DIAGNOSIS — E119 Type 2 diabetes mellitus without complications: Secondary | ICD-10-CM

## 2017-12-23 DIAGNOSIS — Z8249 Family history of ischemic heart disease and other diseases of the circulatory system: Secondary | ICD-10-CM | POA: Diagnosis not present

## 2017-12-23 DIAGNOSIS — I10 Essential (primary) hypertension: Secondary | ICD-10-CM | POA: Diagnosis not present

## 2017-12-23 DIAGNOSIS — E785 Hyperlipidemia, unspecified: Secondary | ICD-10-CM | POA: Diagnosis not present

## 2017-12-23 DIAGNOSIS — G8929 Other chronic pain: Secondary | ICD-10-CM

## 2017-12-23 DIAGNOSIS — Z87891 Personal history of nicotine dependence: Secondary | ICD-10-CM | POA: Diagnosis not present

## 2017-12-23 NOTE — Patient Instructions (Signed)
Access Code: [email or web address]  URL: [email or web address]  Date: 12/23/2017  Prepared by: Barry Brunner   Exercises  90/90 SI Joint Self-Correction with Dowel - 10 reps - 1 sets - 3 hold - 2x daily - 7x weekly  Supine Single Knee to Chest Stretch - 3 reps - 1 sets - 30 hold - 2x daily - 7x weekly
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
## 2017-12-26 ENCOUNTER — Inpatient Hospital Stay: Payer: Medicare Other | Attending: Obstetrics | Admitting: Obstetrics

## 2017-12-26 ENCOUNTER — Ambulatory Visit (HOSPITAL_COMMUNITY): Admission: RE | Admit: 2017-12-26 | Payer: Medicare Other | Source: Ambulatory Visit

## 2017-12-27 ENCOUNTER — Ambulatory Visit: Payer: Medicare Other | Admitting: Physical Therapy

## 2017-12-27 ENCOUNTER — Encounter: Payer: Self-pay | Admitting: Physical Therapy

## 2017-12-27 DIAGNOSIS — Z8249 Family history of ischemic heart disease and other diseases of the circulatory system: Secondary | ICD-10-CM | POA: Diagnosis not present

## 2017-12-27 DIAGNOSIS — I69354 Hemiplegia and hemiparesis following cerebral infarction affecting left non-dominant side: Secondary | ICD-10-CM | POA: Diagnosis not present

## 2017-12-27 DIAGNOSIS — G8929 Other chronic pain: Secondary | ICD-10-CM

## 2017-12-27 DIAGNOSIS — M545 Low back pain, unspecified: Secondary | ICD-10-CM

## 2017-12-27 DIAGNOSIS — R262 Difficulty in walking, not elsewhere classified: Secondary | ICD-10-CM

## 2017-12-27 DIAGNOSIS — I10 Essential (primary) hypertension: Secondary | ICD-10-CM | POA: Diagnosis not present

## 2017-12-27 DIAGNOSIS — Z87891 Personal history of nicotine dependence: Secondary | ICD-10-CM | POA: Diagnosis not present

## 2017-12-27 DIAGNOSIS — E119 Type 2 diabetes mellitus without complications: Secondary | ICD-10-CM | POA: Diagnosis not present

## 2017-12-27 DIAGNOSIS — E785 Hyperlipidemia, unspecified: Secondary | ICD-10-CM | POA: Diagnosis not present
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
## 2017-12-29 ENCOUNTER — Ambulatory Visit: Payer: Medicare Other | Admitting: Physical Therapy

## 2017-12-29 ENCOUNTER — Encounter: Payer: Self-pay | Admitting: Physical Therapy

## 2017-12-29 DIAGNOSIS — Z8249 Family history of ischemic heart disease and other diseases of the circulatory system: Secondary | ICD-10-CM | POA: Diagnosis not present

## 2017-12-29 DIAGNOSIS — R262 Difficulty in walking, not elsewhere classified: Secondary | ICD-10-CM

## 2017-12-29 DIAGNOSIS — M545 Low back pain: Principal | ICD-10-CM

## 2017-12-29 DIAGNOSIS — G8929 Other chronic pain: Secondary | ICD-10-CM

## 2017-12-29 DIAGNOSIS — I10 Essential (primary) hypertension: Secondary | ICD-10-CM | POA: Diagnosis not present

## 2017-12-29 DIAGNOSIS — E119 Type 2 diabetes mellitus without complications: Secondary | ICD-10-CM | POA: Diagnosis not present

## 2017-12-29 DIAGNOSIS — Z87891 Personal history of nicotine dependence: Secondary | ICD-10-CM | POA: Diagnosis not present

## 2017-12-29 DIAGNOSIS — I69354 Hemiplegia and hemiparesis following cerebral infarction affecting left non-dominant side: Secondary | ICD-10-CM | POA: Diagnosis not present

## 2017-12-29 DIAGNOSIS — E785 Hyperlipidemia, unspecified: Secondary | ICD-10-CM | POA: Diagnosis not present
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
## 2017-12-30 ENCOUNTER — Other Ambulatory Visit: Payer: Self-pay

## 2017-12-30 ENCOUNTER — Encounter: Payer: Self-pay | Admitting: Physical Medicine & Rehabilitation

## 2017-12-30 ENCOUNTER — Ambulatory Visit (HOSPITAL_BASED_OUTPATIENT_CLINIC_OR_DEPARTMENT_OTHER): Payer: Medicare Other | Admitting: Physical Medicine & Rehabilitation

## 2017-12-30 VITALS — BP 144/79 | HR 82 | Ht 64.0 in | Wt 180.0 lb

## 2017-12-30 DIAGNOSIS — I639 Cerebral infarction, unspecified: Secondary | ICD-10-CM | POA: Diagnosis not present

## 2017-12-30 DIAGNOSIS — G8112 Spastic hemiplegia affecting left dominant side: Secondary | ICD-10-CM

## 2017-12-30 DIAGNOSIS — I69354 Hemiplegia and hemiparesis following cerebral infarction affecting left non-dominant side: Secondary | ICD-10-CM | POA: Diagnosis not present

## 2017-12-30 DIAGNOSIS — Z8249 Family history of ischemic heart disease and other diseases of the circulatory system: Secondary | ICD-10-CM | POA: Diagnosis not present

## 2017-12-30 DIAGNOSIS — Z87891 Personal history of nicotine dependence: Secondary | ICD-10-CM | POA: Diagnosis not present

## 2017-12-30 DIAGNOSIS — E119 Type 2 diabetes mellitus without complications: Secondary | ICD-10-CM | POA: Diagnosis not present

## 2017-12-30 DIAGNOSIS — E785 Hyperlipidemia, unspecified: Secondary | ICD-10-CM | POA: Diagnosis not present

## 2017-12-30 DIAGNOSIS — I10 Essential (primary) hypertension: Secondary | ICD-10-CM | POA: Diagnosis not present
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
## 2017-12-30 NOTE — Patient Instructions (Signed)
Will make some adjust ment to Botox dose

## 2018-01-02 ENCOUNTER — Ambulatory Visit: Payer: Medicare Other | Admitting: Physical Therapy

## 2018-01-02 ENCOUNTER — Encounter: Payer: Self-pay | Admitting: Physical Therapy

## 2018-01-02 ENCOUNTER — Telehealth: Payer: Self-pay | Admitting: *Deleted

## 2018-01-02 DIAGNOSIS — E119 Type 2 diabetes mellitus without complications: Secondary | ICD-10-CM | POA: Diagnosis not present

## 2018-01-02 DIAGNOSIS — M545 Low back pain, unspecified: Secondary | ICD-10-CM

## 2018-01-02 DIAGNOSIS — Z8249 Family history of ischemic heart disease and other diseases of the circulatory system: Secondary | ICD-10-CM | POA: Diagnosis not present

## 2018-01-02 DIAGNOSIS — I69354 Hemiplegia and hemiparesis following cerebral infarction affecting left non-dominant side: Secondary | ICD-10-CM | POA: Diagnosis not present

## 2018-01-02 DIAGNOSIS — G8929 Other chronic pain: Secondary | ICD-10-CM

## 2018-01-02 DIAGNOSIS — E785 Hyperlipidemia, unspecified: Secondary | ICD-10-CM | POA: Diagnosis not present

## 2018-01-02 DIAGNOSIS — Z87891 Personal history of nicotine dependence: Secondary | ICD-10-CM | POA: Diagnosis not present

## 2018-01-02 DIAGNOSIS — R262 Difficulty in walking, not elsewhere classified: Secondary | ICD-10-CM

## 2018-01-02 DIAGNOSIS — I10 Essential (primary) hypertension: Secondary | ICD-10-CM | POA: Diagnosis not present
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
## 2018-01-02 NOTE — Telephone Encounter (Signed)
Reached out to the patient regarding the missed [place] scan and MD visit. Patient stated "When I saw her, she didn't think I really needed anything done." Asked if the patient would think to reschedule her missed scan and/or MD visit for the doctor to follow up of her she answered "no." message forwarded to [facility] APP and Dr. Gerarda Fraction

## 2018-01-09 LAB — CUP PACEART REMOTE DEVICE CHECK
Implantable Pulse Generator Implant Date: 20170829
MDC IDC SESS DTM: 20191201153800

## 2018-01-10 ENCOUNTER — Other Ambulatory Visit: Payer: Self-pay | Admitting: Cardiovascular Disease

## 2018-01-13 ENCOUNTER — Ambulatory Visit (INDEPENDENT_AMBULATORY_CARE_PROVIDER_SITE_OTHER): Payer: Medicare HMO

## 2018-01-13 ENCOUNTER — Encounter

## 2018-01-13 DIAGNOSIS — I639 Cerebral infarction, unspecified: Secondary | ICD-10-CM

## 2018-01-13 LAB — CUP PACEART REMOTE DEVICE CHECK
Date Time Interrogation Session: 20200103130949
Implantable Pulse Generator Implant Date: 20170829

## 2018-01-16 NOTE — Progress Notes (Signed)
Carelink Summary Report / Loop Recorder 

## 2018-01-17 ENCOUNTER — Ambulatory Visit: Payer: Medicare HMO | Admitting: Physical Therapy

## 2018-01-19 ENCOUNTER — Ambulatory Visit: Payer: Medicare HMO | Admitting: Physical Therapy

## 2018-01-23 ENCOUNTER — Ambulatory Visit: Payer: Medicare HMO | Attending: Neurology | Admitting: Physical Therapy

## 2018-01-23 ENCOUNTER — Encounter: Payer: Self-pay | Admitting: Physical Therapy

## 2018-01-23 DIAGNOSIS — G8929 Other chronic pain: Secondary | ICD-10-CM | POA: Insufficient documentation

## 2018-01-23 DIAGNOSIS — M545 Low back pain: Secondary | ICD-10-CM | POA: Diagnosis not present

## 2018-01-23 DIAGNOSIS — R262 Difficulty in walking, not elsewhere classified: Secondary | ICD-10-CM | POA: Diagnosis not present
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
## 2018-01-25 ENCOUNTER — Ambulatory Visit: Payer: Medicare HMO | Admitting: Physical Therapy

## 2018-02-10 ENCOUNTER — Encounter: Payer: BLUE CROSS/BLUE SHIELD | Attending: Physical Medicine & Rehabilitation

## 2018-02-10 ENCOUNTER — Ambulatory Visit: Payer: Medicare Other | Admitting: Physical Medicine & Rehabilitation

## 2018-02-10 DIAGNOSIS — Z87891 Personal history of nicotine dependence: Secondary | ICD-10-CM | POA: Insufficient documentation

## 2018-02-10 DIAGNOSIS — E785 Hyperlipidemia, unspecified: Secondary | ICD-10-CM | POA: Insufficient documentation

## 2018-02-10 DIAGNOSIS — I69354 Hemiplegia and hemiparesis following cerebral infarction affecting left non-dominant side: Secondary | ICD-10-CM | POA: Insufficient documentation

## 2018-02-10 DIAGNOSIS — E119 Type 2 diabetes mellitus without complications: Secondary | ICD-10-CM | POA: Insufficient documentation

## 2018-02-10 DIAGNOSIS — Z800 Family history of malignant neoplasm of digestive organs: Secondary | ICD-10-CM | POA: Insufficient documentation

## 2018-02-10 DIAGNOSIS — I10 Essential (primary) hypertension: Secondary | ICD-10-CM | POA: Insufficient documentation

## 2018-02-10 DIAGNOSIS — Z8249 Family history of ischemic heart disease and other diseases of the circulatory system: Secondary | ICD-10-CM | POA: Insufficient documentation

## 2018-02-15 ENCOUNTER — Ambulatory Visit (INDEPENDENT_AMBULATORY_CARE_PROVIDER_SITE_OTHER): Payer: Medicare HMO

## 2018-02-15 DIAGNOSIS — I639 Cerebral infarction, unspecified: Secondary | ICD-10-CM | POA: Diagnosis not present

## 2018-02-17 LAB — CUP PACEART REMOTE DEVICE CHECK
Date Time Interrogation Session: 20200205164005
Implantable Pulse Generator Implant Date: 20170829

## 2018-02-22 ENCOUNTER — Telehealth: Payer: Self-pay | Admitting: Neurology

## 2018-02-22 DIAGNOSIS — M549 Dorsalgia, unspecified: Secondary | ICD-10-CM

## 2018-02-22 NOTE — Telephone Encounter (Signed)
From what I can tell it looks like PT is still suggesting rehab.  pls advise.

## 2018-02-22 NOTE — Telephone Encounter (Signed)
Patient is calling in wanting a referral sent to [facility]. Please send this in for her. Thanks!

## 2018-02-23 NOTE — Telephone Encounter (Signed)
Orders placed in system

## 2018-02-23 NOTE — Telephone Encounter (Signed)
Reviewed PT note, agree, that is fine, thanks

## 2018-02-24 ENCOUNTER — Telehealth: Payer: Self-pay | Admitting: Neurology

## 2018-02-24 NOTE — Progress Notes (Signed)
Carelink Summary Report / Loop Recorder 

## 2018-02-24 NOTE — Telephone Encounter (Signed)
Patient is calling in about a referral for [facility] neuro physical therapy and rehab. She thinks its on [address]. Please call her at [phone] and let her know if this was sent. Thanks!

## 2018-02-27 NOTE — Telephone Encounter (Signed)
Patient is calling in again. Please call. Thanks!  °

## 2018-02-28 NOTE — Telephone Encounter (Signed)
PT has attempted to contact pt and LMOM for pt to return call.

## 2018-03-10 NOTE — Telephone Encounter (Signed)
I did not call pt.  She should call PT to schedule.

## 2018-03-10 NOTE — Telephone Encounter (Signed)
Patient is returning your call.  

## 2018-03-13 NOTE — Telephone Encounter (Signed)
Gave patient the PT phone number to call and schedule. Thanks!

## 2018-03-20 ENCOUNTER — Ambulatory Visit (INDEPENDENT_AMBULATORY_CARE_PROVIDER_SITE_OTHER): Payer: Medicare HMO | Admitting: *Deleted

## 2018-03-20 DIAGNOSIS — I639 Cerebral infarction, unspecified: Secondary | ICD-10-CM | POA: Diagnosis not present

## 2018-03-21 LAB — CUP PACEART REMOTE DEVICE CHECK
Date Time Interrogation Session: 20200309173951
Implantable Pulse Generator Implant Date: 20170829

## 2018-03-24 ENCOUNTER — Ambulatory Visit: Payer: Medicare HMO | Attending: Neurology | Admitting: Physical Therapy

## 2018-03-24 ENCOUNTER — Other Ambulatory Visit: Payer: Self-pay

## 2018-03-24 DIAGNOSIS — M545 Low back pain, unspecified: Secondary | ICD-10-CM

## 2018-03-24 DIAGNOSIS — R262 Difficulty in walking, not elsewhere classified: Secondary | ICD-10-CM | POA: Insufficient documentation

## 2018-03-24 DIAGNOSIS — G8929 Other chronic pain: Secondary | ICD-10-CM | POA: Insufficient documentation
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
## 2018-03-24 NOTE — Patient Instructions (Addendum)
Access Code: [email or web address]  URL: [email or web address]  Date: 03/24/2018  Prepared by: Elsie Ra   Exercises  Standing Quadratus Lumborum Stretch with Doorway - 3 reps - 1 sets - 30 hold - 2x daily - 6x weekly  Supine Piriformis Stretch with Foot on Ground - 3 sets - 30 hold - 2x daily - 6x weekly  Clamshell with Resistance - 10 reps - 3 sets - 2x daily - 6x weekly  Supine Bridge - 10 reps - 2 sets - 5 hold - 2x daily - 6x weekly  Standing Lumbar Extension - 10 reps - 1-2 sets - 5 hold - 2x daily - 6x weekly  Standing Hip Abduction - 10 reps - 1-2 sets - 2x daily - 6x weekly   TENS UNIT: This is helpful for muscle pain and spasm.   Search and Purchase a TENS 7000 2nd edition at www.tenspros.com. It should be less than $30.     TENS unit instructions: Do not shower or bathe with the unit on Turn the unit off before removing electrodes or batteries If the electrodes lose stickiness add a drop of water to the electrodes after they are disconnected from the unit and place on plastic sheet. If you continued to have difficulty, call the TENS unit company to purchase more electrodes. Do not apply lotion on the skin area prior to use. Make sure the skin is clean and dry as this will help prolong the life of the electrodes. After use, always check skin for unusual red areas, rash or other skin difficulties. If there are any skin problems, does not apply electrodes to the same area. Never remove the electrodes from the unit by pulling the wires. Do not use the TENS unit or electrodes other than as directed. Do not change electrode placement without consultating your therapist or physician. Keep 2 fingers with between each electrode. Wear time ratio is 2:1, on to off times.    For example on for 30 minutes off for 15 minutes and then on for 30 minutes off for 15 minutes

## 2018-03-28 NOTE — Progress Notes (Signed)
Carelink Summary Report / Loop Recorder 

## 2018-03-30 ENCOUNTER — Ambulatory Visit: Payer: Medicare HMO | Admitting: Physical Therapy

## 2018-03-30 ENCOUNTER — Other Ambulatory Visit: Payer: Self-pay

## 2018-03-30 ENCOUNTER — Encounter: Payer: Self-pay | Admitting: Physical Therapy

## 2018-03-30 DIAGNOSIS — M545 Low back pain, unspecified: Secondary | ICD-10-CM

## 2018-03-30 DIAGNOSIS — R262 Difficulty in walking, not elsewhere classified: Secondary | ICD-10-CM

## 2018-03-30 DIAGNOSIS — G8929 Other chronic pain: Secondary | ICD-10-CM
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
## 2018-03-31 ENCOUNTER — Ambulatory Visit: Payer: Medicare HMO | Admitting: Physical Medicine & Rehabilitation

## 2018-03-31 ENCOUNTER — Encounter: Payer: Self-pay | Admitting: Physical Medicine & Rehabilitation

## 2018-03-31 ENCOUNTER — Encounter: Payer: Medicare HMO | Attending: Physical Medicine & Rehabilitation

## 2018-03-31 VITALS — BP 138/83 | HR 68 | Ht 64.25 in | Wt 177.0 lb

## 2018-03-31 DIAGNOSIS — Z8249 Family history of ischemic heart disease and other diseases of the circulatory system: Secondary | ICD-10-CM | POA: Diagnosis not present

## 2018-03-31 DIAGNOSIS — G8112 Spastic hemiplegia affecting left dominant side: Secondary | ICD-10-CM | POA: Diagnosis not present

## 2018-03-31 DIAGNOSIS — I10 Essential (primary) hypertension: Secondary | ICD-10-CM | POA: Diagnosis not present

## 2018-03-31 DIAGNOSIS — Z800 Family history of malignant neoplasm of digestive organs: Secondary | ICD-10-CM | POA: Diagnosis not present

## 2018-03-31 DIAGNOSIS — Z87891 Personal history of nicotine dependence: Secondary | ICD-10-CM | POA: Diagnosis not present

## 2018-03-31 DIAGNOSIS — I69354 Hemiplegia and hemiparesis following cerebral infarction affecting left non-dominant side: Secondary | ICD-10-CM | POA: Insufficient documentation

## 2018-03-31 DIAGNOSIS — E785 Hyperlipidemia, unspecified: Secondary | ICD-10-CM | POA: Diagnosis not present

## 2018-03-31 DIAGNOSIS — E119 Type 2 diabetes mellitus without complications: Secondary | ICD-10-CM | POA: Diagnosis not present

## 2018-03-31 NOTE — Progress Notes (Signed)
Botox Injection for spasticity using needle EMG guidance  Dilution: 50 Units/ml Indication: Severe spasticity which interferes with ADL,mobility and/or  hygiene and is unresponsive to medication management and other conservative care Informed consent was obtained after describing risks and benefits of the procedure with the patient. This includes bleeding, bruising, infection, excessive weakness, or medication side effects. A REMS form is on file and signed. Needle: 27g needle electrode Number of units per muscle  PT 25U FCR25 FCU0 FDS50 FDP50 FPL50 Waste 50 All injections were done after obtaining appropriate EMG activity and after negative drawback for blood. The patient tolerated the procedure well. Post procedure instructions were given. A followup appointment was made.

## 2018-03-31 NOTE — Patient Instructions (Signed)

## 2018-04-06 ENCOUNTER — Telehealth: Payer: Self-pay

## 2018-04-06 NOTE — Telephone Encounter (Signed)
Called and left message for patient today to return call to clinic to reschedule her appointment for Monday. Left message that she would need to push her appointment out a couple of months. Due to her current health history and greater risk of exposure for COVID-19 she has been advised that appointment would be cancelled and to call office to reschedule. I did also let her know in terms of locating an MD as she requested we had other [place] offices that are currently accepting new patients. Informed her she could call office back and reschedule to see an MD at [place] location, [place] or [place].

## 2018-04-07 ENCOUNTER — Ambulatory Visit: Payer: Medicare HMO | Admitting: Physical Therapy

## 2018-04-10 ENCOUNTER — Ambulatory Visit: Payer: Medicare HMO | Admitting: Family

## 2018-04-14 ENCOUNTER — Ambulatory Visit: Payer: Medicare HMO | Admitting: Physical Therapy

## 2018-04-19 ENCOUNTER — Ambulatory Visit: Payer: Medicare HMO | Admitting: Physical Therapy

## 2018-04-20 ENCOUNTER — Telehealth (INDEPENDENT_AMBULATORY_CARE_PROVIDER_SITE_OTHER): Payer: Medicare HMO | Admitting: Neurology

## 2018-04-20 ENCOUNTER — Encounter: Payer: Self-pay | Admitting: *Deleted

## 2018-04-20 ENCOUNTER — Other Ambulatory Visit: Payer: Self-pay

## 2018-04-20 DIAGNOSIS — G40209 Localization-related (focal) (partial) symptomatic epilepsy and epileptic syndromes with complex partial seizures, not intractable, without status epilepticus: Secondary | ICD-10-CM

## 2018-04-20 DIAGNOSIS — R413 Other amnesia: Secondary | ICD-10-CM

## 2018-04-20 DIAGNOSIS — Z8673 Personal history of transient ischemic attack (TIA), and cerebral infarction without residual deficits: Secondary | ICD-10-CM

## 2018-04-20 MED ORDER — LEVETIRACETAM 1000 MG PO TABS: 1000.0000 mg | ORAL_TABLET | Freq: Two times a day (BID) | ORAL | 3 refills | Status: DC

## 2018-04-20 NOTE — Progress Notes (Signed)
Virtual Visit via Telephone Note The purpose of this virtual visit is to provide medical care while limiting exposure to the novel coronavirus.    Consent was obtained for phone visit:  Yes.   Answered questions that patient had about telehealth interaction:  Yes.   I discussed the limitations, risks, security and privacy concerns of performing an evaluation and management service by telephone. I also discussed with the patient that there may be a patient responsible charge related to this service. The patient expressed understanding and agreed to proceed.  Pt location: Home Physician Location: office Name of referring provider:  Rosalie Doctor, DO I connected with .Gearldine Bienenstock at patients initiation/request on 04/20/2018 at 11:00 AM EDT by telephone and verified that I am speaking with the correct person using two identifiers.  Pt MRN:  [id number] Pt DOB:  12/28/1955   History of Present Illness:  The patient was last seen in November 2019. She has a history of recurrent stroke and possible seizure in 2017. She is taking Keppra 1000mg  BID with no further episodes of decreased awareness since April 2017. She denies any staring/unresponsive episodes, gaps in time, olfactory/gustatory hallucinations, new focal numbness/tingling/weakness, myoclonic jerks. She denies any headaches, dizziness, no falls. Her main concern continues to be her memory and "perpetual fog." She had to withdraw from school last week, it got to be too much for her. She reports the forgetfulness was disconcerting, she would complete assignments and completely forget about it. She had been waiting for a scribe to take notes due to her inability to write with left hand, but it had not been approved, which added another level of stress for her. She feels she overextended herself and enrolled in too many classes, she plans to go back in the Fall and take 2 classes.   HPI 07/30/2016: This is a 63 yo LH woman with a history of  hypertension, hyperlipidemia, diabetes, and strokes in March and April 2017. She recalls the first stroke happened at work in March 2017, she started feeling badly, vision was impaired, she felt feverish and dizzy. Her daughter brought her to the ER and she was admitted for a week. There is one Neurology note from [place], [place] on EPIC, she had an elevated BP of 240/119 on arrival to ER, right facial droop, left hemisensory deficits, dysarthria and dysphagia. MRI brain had shown acute infarcts in the right temporal, right middle cerebellar peduncle, and right dorsolateral medulla. Stroke was attributed to untreated hypertension and she was discharged home on aspirin with no residual deficits. She reports that a week or so later, she was back to the hospital in April, she recalls sliding off the bed with her left side paralyzed, unable to answer the doorbell. Her daughter found her on the floor with saliva coming out of the left side of her mouth. She reports she was awake at this time. 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 She had left hemiparesis, decreased consciousness, left hemisensory deficits, extinction to double simultaneous stimulation. She was out of the window for TPA and required a PEG briefly for dysphagia. She had a loop recorder in August 2017 with no episodes of atrial fibrillation. Echo showed EF of 70%, no PFO. She continues on a daily baby aspirin. 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 She denies any seizures, presumably due to decreased consciousness when she was found on the ground, she was started on Keppra  1000mg  BID. She had prolonged EEG in [place] while admitted for the second stroke, no seizure activity seen, there was focal right temporal theta/delta slowing and diffuse slowing of the background.   She moved to [facility] in January 2018 but is currently awaiting news on her housing situation. She has been  living in different motels for the past 6 months and states she does not feel settled. She has family in [place] but they do not have space for her. She denies being told of any staring/unresponsive episodes. She denies any gaps in time, olfactory/gustatory hallucinations, deja vu, rising epigastric sensation, myoclonic jerks. The left side of her face and left arm are constantly numb. Sometimes her left arm becomes weaker/drops down, with no jerking movements. Left leg is fine. She denies any falls. She has noticed the past couple of days feeling like she is "not whole" in her head, "like a spaciness." She has had this sensation several times on and off, then it would go away and she would "migrate back to whatever my reality is." She feels nervous and scared a lot and does not feel like herself. She feels "all alone, almost like I am trapped inside my body." She was in the ER a week ago for somnolence, attributed to newly started Tramadol and sertraline. She has stopped the sertraline, but continues to take prn Tramadol for pain from coughing spells. She reports being told it is due to GERD and has started Pepcid. She took 2 Tramadol yesterday, sometimes she takes 4 a day. She denies any dizziness, headaches, diplopia, neck/back pain, dysarthria/dysphagia, bowel/bladder dysfunction.   Epilepsy Risk Factors:  Right MCA stroke. Otherwise she had a normal birth and early development.  There is no history of febrile convulsions, CNS infections such as meningitis/encephalitis, significant traumatic brain injury, neurosurgical procedures, or family history of seizures.  Observations/Objective:  She is awake, alert, oriented x 3, intact fluency and comprehension but sometimes slow to respond (unclear if this is the phone connection).   Assessment and Plan:   This is a 63 yo LH woman with a history of hypertension, hyperlipidemia, diabetes, and strokes in March and April 2017. The first stroke involved the right  temporal, right middle cerebellar peduncle, and right dorsolateral medulla, stroke was attributed to untreated hypertension. The stroke a week or so later in April 2017 involved the right MCA and was felt to be cardioembolic. Echo normal, per records, loop recorder did not show any evidence of atrial fibrillation. No further stroke since 2017, she continues on aspirin 81mg  daily and control of vascular risk factors. She has been taking Keppra 1000mg  BID since the second stroke in 2017 when she was found with saliva on side of mouth and decreased consciousness, EEG showed right frontotemporal slowing, no epileptiform discharges. She denies any further similar symptoms but continues to have cognitive issues and a "perpetual fog." We discussed how seizure medications can sometimes also affect cognition, in addition to her prior stroke injury. We will plan to reduce Keppra dose on her follow-up visit in 4-5 months. She knows to go to the ER immediately for any sudden change in symptoms.   Follow Up Instructions:   -I discussed the assessment and treatment plan with the patient. The patient was provided an opportunity to ask questions and all were answered. The patient agreed with the plan and demonstrated an understanding of the instructions.   The patient was advised to call back or seek an in-person evaluation if the symptoms worsen or  if the condition fails to improve as anticipated.    Total Time spent in visit with the patient was 23 minutes, of which 100% of the time was spent in counseling and/or coordinating care on the above.   Pt understands and agrees with the plan of care outlined.     Cameron Sprang, MD

## 2018-04-24 ENCOUNTER — Other Ambulatory Visit: Payer: Self-pay

## 2018-04-24 ENCOUNTER — Ambulatory Visit (INDEPENDENT_AMBULATORY_CARE_PROVIDER_SITE_OTHER): Payer: Medicare HMO | Admitting: *Deleted

## 2018-04-24 DIAGNOSIS — I639 Cerebral infarction, unspecified: Secondary | ICD-10-CM | POA: Diagnosis not present

## 2018-04-24 LAB — CUP PACEART REMOTE DEVICE CHECK
Date Time Interrogation Session: 20200411180952
Implantable Pulse Generator Implant Date: 20170829

## 2018-04-25 ENCOUNTER — Encounter: Payer: Self-pay | Admitting: Physical Therapy

## 2018-04-25 ENCOUNTER — Other Ambulatory Visit: Payer: Self-pay

## 2018-04-25 ENCOUNTER — Ambulatory Visit: Payer: Medicare HMO | Attending: Neurology | Admitting: Physical Therapy

## 2018-04-25 DIAGNOSIS — R262 Difficulty in walking, not elsewhere classified: Secondary | ICD-10-CM

## 2018-04-25 DIAGNOSIS — Z8673 Personal history of transient ischemic attack (TIA), and cerebral infarction without residual deficits: Secondary | ICD-10-CM | POA: Insufficient documentation

## 2018-04-25 DIAGNOSIS — M545 Low back pain, unspecified: Secondary | ICD-10-CM

## 2018-04-25 DIAGNOSIS — G8929 Other chronic pain: Secondary | ICD-10-CM | POA: Diagnosis not present
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
## 2018-05-02 ENCOUNTER — Other Ambulatory Visit: Payer: Self-pay

## 2018-05-02 ENCOUNTER — Ambulatory Visit: Payer: Medicare HMO

## 2018-05-02 DIAGNOSIS — Z8673 Personal history of transient ischemic attack (TIA), and cerebral infarction without residual deficits: Secondary | ICD-10-CM

## 2018-05-02 DIAGNOSIS — M545 Low back pain, unspecified: Secondary | ICD-10-CM

## 2018-05-02 DIAGNOSIS — R262 Difficulty in walking, not elsewhere classified: Secondary | ICD-10-CM

## 2018-05-02 DIAGNOSIS — G8929 Other chronic pain: Secondary | ICD-10-CM | POA: Diagnosis not present
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
## 2018-05-02 NOTE — Patient Instructions (Signed)
Step ups lateral and foreward, wall slides , stand hip abduction /flexion/extension 1x/day 10-20 reps ,  Tennis ball STW to anterior and psoterior hip LT

## 2018-05-05 NOTE — Progress Notes (Signed)
Carelink Summary Report / Loop Recorder 

## 2018-05-12 ENCOUNTER — Encounter: Payer: Medicare HMO | Admitting: Physical Medicine & Rehabilitation

## 2018-05-14 ENCOUNTER — Other Ambulatory Visit: Payer: Self-pay | Admitting: Family Medicine

## 2018-05-17 ENCOUNTER — Other Ambulatory Visit: Payer: Self-pay

## 2018-05-17 ENCOUNTER — Ambulatory Visit: Payer: Medicare HMO | Attending: Neurology | Admitting: Physical Therapy

## 2018-05-17 DIAGNOSIS — M25612 Stiffness of left shoulder, not elsewhere classified: Secondary | ICD-10-CM | POA: Diagnosis not present

## 2018-05-17 DIAGNOSIS — G8929 Other chronic pain: Secondary | ICD-10-CM | POA: Insufficient documentation

## 2018-05-17 DIAGNOSIS — M545 Low back pain, unspecified: Secondary | ICD-10-CM

## 2018-05-17 DIAGNOSIS — I69352 Hemiplegia and hemiparesis following cerebral infarction affecting left dominant side: Secondary | ICD-10-CM | POA: Diagnosis not present

## 2018-05-17 DIAGNOSIS — Z8673 Personal history of transient ischemic attack (TIA), and cerebral infarction without residual deficits: Secondary | ICD-10-CM | POA: Diagnosis not present

## 2018-05-17 DIAGNOSIS — R262 Difficulty in walking, not elsewhere classified: Secondary | ICD-10-CM | POA: Insufficient documentation
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
## 2018-05-18 DIAGNOSIS — Z20828 Contact with and (suspected) exposure to other viral communicable diseases: Secondary | ICD-10-CM | POA: Diagnosis not present

## 2018-05-18 IMAGING — CT CT PARANASAL SINUSES LIMITED
1 of 2 series · 9 of 12 positions shown, 12 images · non-contrast
Comparison: None.

CLINICAL DATA: Chronic cough.

EXAM:
CT PARANASAL SINUS LIMITED WITHOUT CONTRAST
TECHNIQUE: Non-contiguous multidetector CT images of the paranasal sinuses were
obtained in a single plane without contrast.

[Series 4: limited sinus st · axial · 0.39mm/px · z∈[-110,-30]mm · 9 of 11 slices shown, 12 images]
[im 2/11  brain]
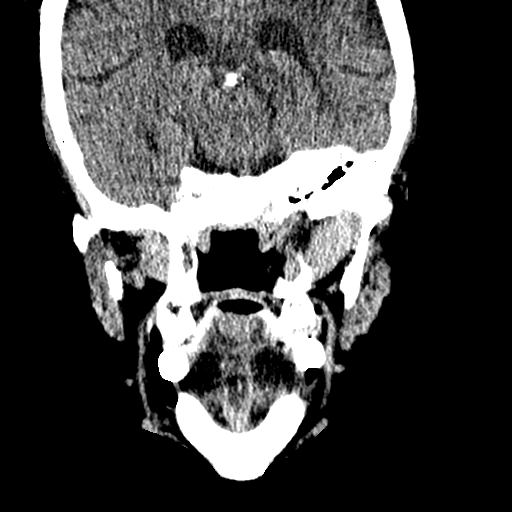
[im 2/11  bone]
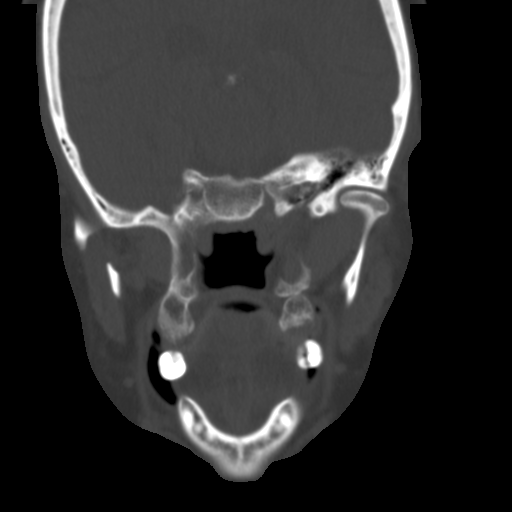
[im 3/11  bone]
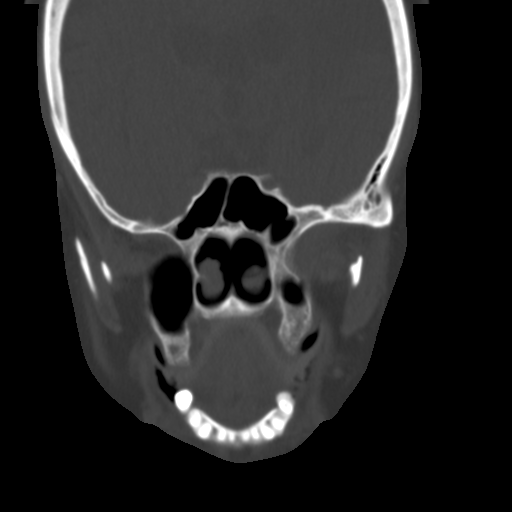
[im 4/11  bone]
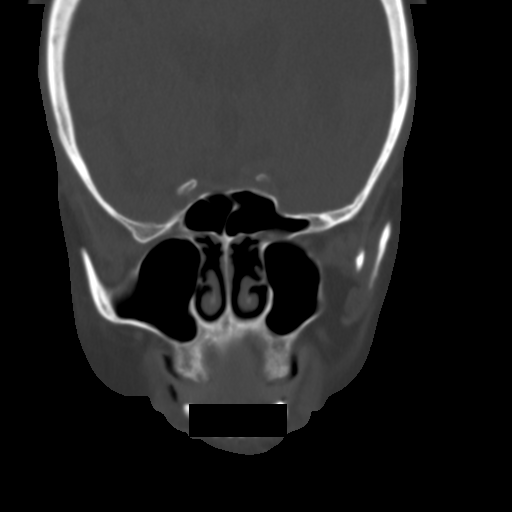
[im 5/11  bone]
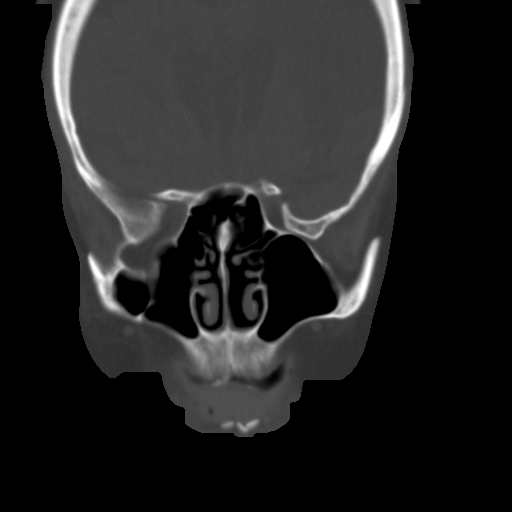
[im 6/11  brain]
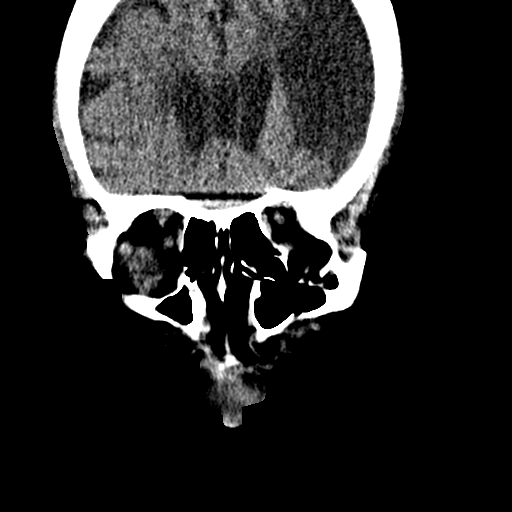
[im 6/11  bone]
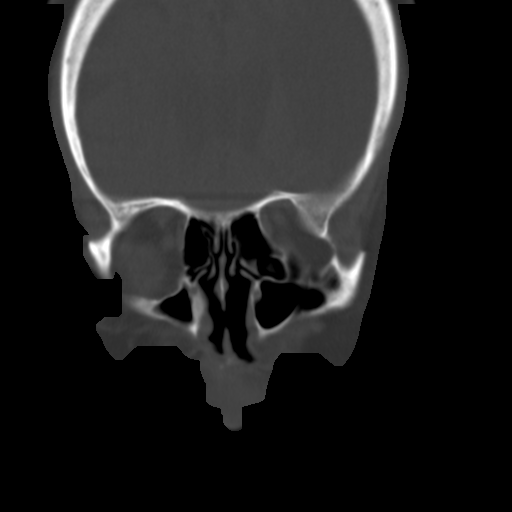
[im 7/11  bone]
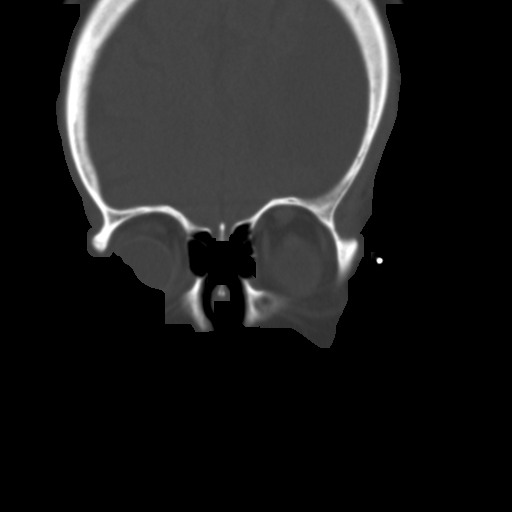
[im 8/11  bone]
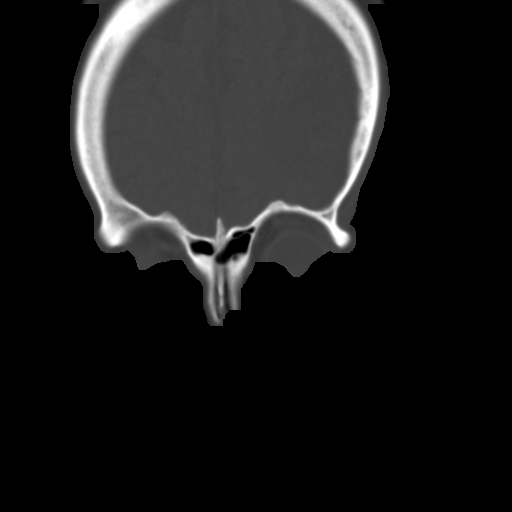
[im 9/11  bone]
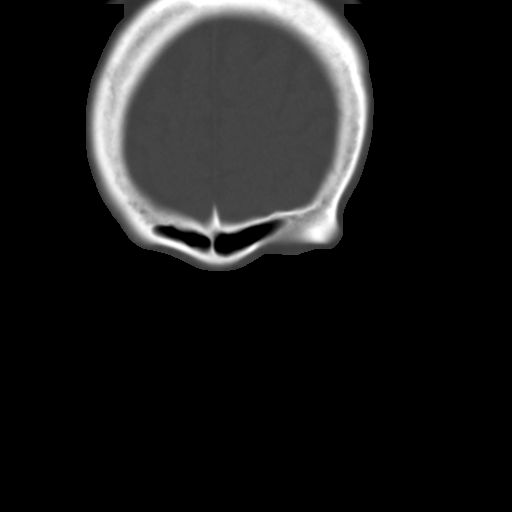
[im 10/11  brain]
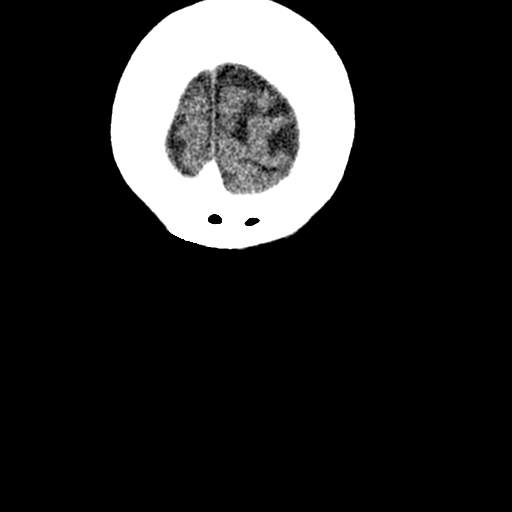
[im 10/11  bone]
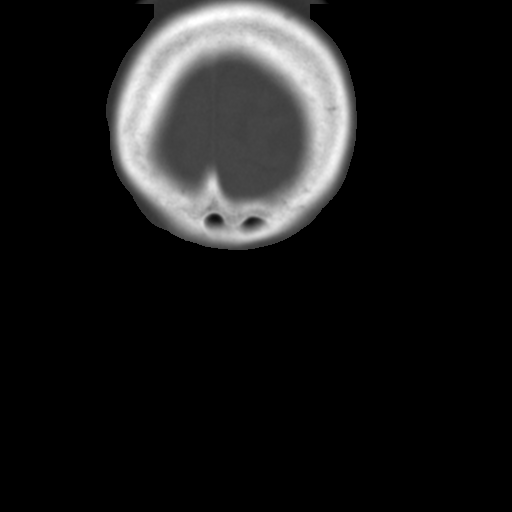

[9 of 12 positions shown; findings below may reference images not displayed]

FINDINGS: The visualized portions of the paranasal sinuses are clear
bilaterally. The nasal septum is midline. A large region of
encephalomalacia is partially visualized in the right cerebral
hemisphere predominantly involving the frontal lobe.
IMPRESSION: 1. Clear paranasal sinuses.
2. Large chronic right MCA territory infarct.

## 2018-05-23 ENCOUNTER — Ambulatory Visit: Payer: Medicare HMO | Admitting: Physical Therapy

## 2018-05-26 ENCOUNTER — Ambulatory Visit (INDEPENDENT_AMBULATORY_CARE_PROVIDER_SITE_OTHER): Payer: Medicare HMO | Admitting: *Deleted

## 2018-05-26 ENCOUNTER — Ambulatory Visit: Payer: Medicare HMO | Admitting: Physical Therapy

## 2018-05-26 ENCOUNTER — Other Ambulatory Visit: Payer: Self-pay

## 2018-05-26 DIAGNOSIS — I69352 Hemiplegia and hemiparesis following cerebral infarction affecting left dominant side: Secondary | ICD-10-CM | POA: Diagnosis not present

## 2018-05-26 DIAGNOSIS — I639 Cerebral infarction, unspecified: Secondary | ICD-10-CM

## 2018-05-26 DIAGNOSIS — G8929 Other chronic pain: Secondary | ICD-10-CM

## 2018-05-26 DIAGNOSIS — R262 Difficulty in walking, not elsewhere classified: Secondary | ICD-10-CM | POA: Diagnosis not present

## 2018-05-26 DIAGNOSIS — Z8673 Personal history of transient ischemic attack (TIA), and cerebral infarction without residual deficits: Secondary | ICD-10-CM | POA: Diagnosis not present

## 2018-05-26 DIAGNOSIS — M25612 Stiffness of left shoulder, not elsewhere classified: Secondary | ICD-10-CM | POA: Diagnosis not present

## 2018-05-26 DIAGNOSIS — M545 Low back pain, unspecified: Secondary | ICD-10-CM

## 2018-05-26 LAB — CUP PACEART REMOTE DEVICE CHECK
Date Time Interrogation Session: 20200514204201
Implantable Pulse Generator Implant Date: 20170829
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
## 2018-05-29 ENCOUNTER — Encounter: Payer: Self-pay | Admitting: Physical Medicine & Rehabilitation

## 2018-05-29 ENCOUNTER — Encounter: Payer: Medicare HMO | Attending: Physical Medicine & Rehabilitation | Admitting: Physical Medicine & Rehabilitation

## 2018-05-29 ENCOUNTER — Other Ambulatory Visit: Payer: Self-pay

## 2018-05-29 VITALS — BP 168/83 | HR 68 | Temp 98.7°F | Ht 64.25 in | Wt 160.0 lb

## 2018-05-29 DIAGNOSIS — I10 Essential (primary) hypertension: Secondary | ICD-10-CM | POA: Diagnosis not present

## 2018-05-29 DIAGNOSIS — I69354 Hemiplegia and hemiparesis following cerebral infarction affecting left non-dominant side: Secondary | ICD-10-CM | POA: Diagnosis not present

## 2018-05-29 DIAGNOSIS — Z87891 Personal history of nicotine dependence: Secondary | ICD-10-CM | POA: Diagnosis not present

## 2018-05-29 DIAGNOSIS — G8112 Spastic hemiplegia affecting left dominant side: Secondary | ICD-10-CM | POA: Diagnosis not present

## 2018-05-29 DIAGNOSIS — E785 Hyperlipidemia, unspecified: Secondary | ICD-10-CM | POA: Insufficient documentation

## 2018-05-29 DIAGNOSIS — Z800 Family history of malignant neoplasm of digestive organs: Secondary | ICD-10-CM | POA: Insufficient documentation

## 2018-05-29 DIAGNOSIS — E119 Type 2 diabetes mellitus without complications: Secondary | ICD-10-CM | POA: Insufficient documentation

## 2018-05-29 DIAGNOSIS — Z8249 Family history of ischemic heart disease and other diseases of the circulatory system: Secondary | ICD-10-CM | POA: Insufficient documentation
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
## 2018-05-29 NOTE — Patient Instructions (Signed)
Will see if any stroke studies are enrolling pt during coronavirus restrictions

## 2018-05-29 NOTE — Progress Notes (Signed)
Carelink Summary Report / Loop Recorder 

## 2018-05-30 ENCOUNTER — Encounter: Payer: Self-pay | Admitting: Physical Therapy

## 2018-05-30 ENCOUNTER — Ambulatory Visit: Payer: Medicare HMO | Admitting: Physical Therapy

## 2018-05-30 DIAGNOSIS — M545 Low back pain, unspecified: Secondary | ICD-10-CM

## 2018-05-30 DIAGNOSIS — Z8673 Personal history of transient ischemic attack (TIA), and cerebral infarction without residual deficits: Secondary | ICD-10-CM | POA: Diagnosis not present

## 2018-05-30 DIAGNOSIS — G8929 Other chronic pain: Secondary | ICD-10-CM | POA: Diagnosis not present

## 2018-05-30 DIAGNOSIS — R262 Difficulty in walking, not elsewhere classified: Secondary | ICD-10-CM | POA: Diagnosis not present

## 2018-05-30 DIAGNOSIS — I69352 Hemiplegia and hemiparesis following cerebral infarction affecting left dominant side: Secondary | ICD-10-CM | POA: Diagnosis not present

## 2018-05-30 DIAGNOSIS — M25612 Stiffness of left shoulder, not elsewhere classified: Secondary | ICD-10-CM | POA: Diagnosis not present
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
## 2018-06-01 ENCOUNTER — Ambulatory Visit: Payer: Medicare HMO | Admitting: Physical Therapy

## 2018-06-06 ENCOUNTER — Other Ambulatory Visit: Payer: Self-pay

## 2018-06-06 ENCOUNTER — Ambulatory Visit: Payer: Medicare HMO | Admitting: Physical Therapy

## 2018-06-06 ENCOUNTER — Encounter: Payer: Self-pay | Admitting: Physical Therapy

## 2018-06-06 DIAGNOSIS — M545 Low back pain, unspecified: Secondary | ICD-10-CM

## 2018-06-06 DIAGNOSIS — M25612 Stiffness of left shoulder, not elsewhere classified: Secondary | ICD-10-CM | POA: Diagnosis not present

## 2018-06-06 DIAGNOSIS — I69352 Hemiplegia and hemiparesis following cerebral infarction affecting left dominant side: Secondary | ICD-10-CM | POA: Diagnosis not present

## 2018-06-06 DIAGNOSIS — Z8673 Personal history of transient ischemic attack (TIA), and cerebral infarction without residual deficits: Secondary | ICD-10-CM | POA: Diagnosis not present

## 2018-06-06 DIAGNOSIS — R262 Difficulty in walking, not elsewhere classified: Secondary | ICD-10-CM | POA: Diagnosis not present

## 2018-06-06 DIAGNOSIS — G8929 Other chronic pain: Secondary | ICD-10-CM | POA: Diagnosis not present
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
## 2018-06-08 ENCOUNTER — Ambulatory Visit: Payer: Medicare HMO | Admitting: Physical Therapy

## 2018-06-08 ENCOUNTER — Encounter: Payer: Self-pay | Admitting: Physical Therapy

## 2018-06-08 ENCOUNTER — Other Ambulatory Visit: Payer: Self-pay

## 2018-06-08 DIAGNOSIS — I69352 Hemiplegia and hemiparesis following cerebral infarction affecting left dominant side: Secondary | ICD-10-CM | POA: Diagnosis not present

## 2018-06-08 DIAGNOSIS — Z8673 Personal history of transient ischemic attack (TIA), and cerebral infarction without residual deficits: Secondary | ICD-10-CM | POA: Diagnosis not present

## 2018-06-08 DIAGNOSIS — G8929 Other chronic pain: Secondary | ICD-10-CM

## 2018-06-08 DIAGNOSIS — R262 Difficulty in walking, not elsewhere classified: Secondary | ICD-10-CM | POA: Diagnosis not present

## 2018-06-08 DIAGNOSIS — M545 Low back pain, unspecified: Secondary | ICD-10-CM

## 2018-06-08 DIAGNOSIS — M25612 Stiffness of left shoulder, not elsewhere classified: Secondary | ICD-10-CM | POA: Diagnosis not present
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
## 2018-06-12 ENCOUNTER — Telehealth: Payer: Self-pay | Admitting: Physical Therapy

## 2018-06-12 ENCOUNTER — Ambulatory Visit: Payer: Medicare Other | Attending: Neurology | Admitting: Physical Therapy

## 2018-06-12 DIAGNOSIS — G8929 Other chronic pain: Secondary | ICD-10-CM | POA: Insufficient documentation

## 2018-06-12 DIAGNOSIS — M545 Low back pain: Secondary | ICD-10-CM | POA: Insufficient documentation

## 2018-06-12 DIAGNOSIS — R262 Difficulty in walking, not elsewhere classified: Secondary | ICD-10-CM | POA: Insufficient documentation

## 2018-06-12 DIAGNOSIS — Z8673 Personal history of transient ischemic attack (TIA), and cerebral infarction without residual deficits: Secondary | ICD-10-CM | POA: Insufficient documentation

## 2018-06-12 NOTE — Telephone Encounter (Signed)
Left message on voice mail regarding missed appointment. Left next appointment time with phone number if she would like to cancel or reschedule.

## 2018-06-14 ENCOUNTER — Ambulatory Visit: Payer: Medicare Other | Admitting: Physical Therapy

## 2018-06-14 ENCOUNTER — Other Ambulatory Visit: Payer: Self-pay

## 2018-06-14 DIAGNOSIS — R262 Difficulty in walking, not elsewhere classified: Secondary | ICD-10-CM

## 2018-06-14 DIAGNOSIS — Z8673 Personal history of transient ischemic attack (TIA), and cerebral infarction without residual deficits: Secondary | ICD-10-CM

## 2018-06-14 DIAGNOSIS — G8929 Other chronic pain: Secondary | ICD-10-CM | POA: Diagnosis present

## 2018-06-14 DIAGNOSIS — M545 Low back pain, unspecified: Secondary | ICD-10-CM
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
## 2018-06-19 ENCOUNTER — Telehealth: Payer: Self-pay | Admitting: Physical Therapy

## 2018-06-19 ENCOUNTER — Ambulatory Visit: Payer: Medicare Other | Admitting: Physical Therapy

## 2018-06-19 NOTE — Telephone Encounter (Signed)
Wendy [name] returned call to clinic regarding no-show and stated she was unaware of her appointment this morning. She does plan to attend her next appointment on 06/21/18 at 9:30 am.

## 2018-06-19 NOTE — Telephone Encounter (Signed)
Left message regarding no-show to appointment this morning. Left next appointment time/date and asked that she call if she cannot attend. Also, left reminder of attendance policy and that her appointments will be canceled if she has anther no-show.

## 2018-06-21 ENCOUNTER — Encounter: Payer: Self-pay | Admitting: Physical Therapy

## 2018-06-21 ENCOUNTER — Ambulatory Visit: Payer: Medicare Other | Admitting: Physical Therapy

## 2018-06-21 ENCOUNTER — Other Ambulatory Visit: Payer: Self-pay

## 2018-06-21 DIAGNOSIS — M545 Low back pain, unspecified: Secondary | ICD-10-CM

## 2018-06-21 DIAGNOSIS — Z8673 Personal history of transient ischemic attack (TIA), and cerebral infarction without residual deficits: Secondary | ICD-10-CM

## 2018-06-21 DIAGNOSIS — G8929 Other chronic pain: Secondary | ICD-10-CM

## 2018-06-21 DIAGNOSIS — R262 Difficulty in walking, not elsewhere classified: Secondary | ICD-10-CM
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
## 2018-06-22 ENCOUNTER — Ambulatory Visit (INDEPENDENT_AMBULATORY_CARE_PROVIDER_SITE_OTHER): Payer: Medicare Other | Admitting: Family Medicine

## 2018-06-22 ENCOUNTER — Encounter: Payer: Self-pay | Admitting: Family Medicine

## 2018-06-22 VITALS — BP 136/70 | HR 80 | Ht 64.25 in | Wt 177.1 lb

## 2018-06-22 DIAGNOSIS — E0865 Diabetes mellitus due to underlying condition with hyperglycemia: Secondary | ICD-10-CM

## 2018-06-22 DIAGNOSIS — N9489 Other specified conditions associated with female genital organs and menstrual cycle: Secondary | ICD-10-CM

## 2018-06-22 DIAGNOSIS — R7989 Other specified abnormal findings of blood chemistry: Secondary | ICD-10-CM

## 2018-06-22 DIAGNOSIS — R0989 Other specified symptoms and signs involving the circulatory and respiratory systems: Secondary | ICD-10-CM

## 2018-06-22 DIAGNOSIS — E7849 Other hyperlipidemia: Secondary | ICD-10-CM | POA: Diagnosis not present

## 2018-06-22 DIAGNOSIS — Z8673 Personal history of transient ischemic attack (TIA), and cerebral infarction without residual deficits: Secondary | ICD-10-CM

## 2018-06-22 DIAGNOSIS — I6522 Occlusion and stenosis of left carotid artery: Secondary | ICD-10-CM

## 2018-06-22 DIAGNOSIS — E119 Type 2 diabetes mellitus without complications: Secondary | ICD-10-CM

## 2018-06-22 DIAGNOSIS — I10 Essential (primary) hypertension: Secondary | ICD-10-CM

## 2018-06-22 DIAGNOSIS — R945 Abnormal results of liver function studies: Secondary | ICD-10-CM

## 2018-06-22 LAB — COMPREHENSIVE METABOLIC PANEL
ALT: 18 U/L (ref 0–35)
AST: 17 U/L (ref 0–37)
Albumin: 4.4 g/dL (ref 3.5–5.2)
Alkaline Phosphatase: 168 U/L — ABNORMAL HIGH (ref 39–117)
BUN: 11 mg/dL (ref 6–23)
CO2: 25 mEq/L (ref 19–32)
Calcium: 9.4 mg/dL (ref 8.4–10.5)
Chloride: 100 mEq/L (ref 96–112)
Creatinine, Ser: 0.76 mg/dL (ref 0.40–1.20)
GFR: 92.45 mL/min (ref >60.00)
Glucose, Bld: 221 mg/dL — ABNORMAL HIGH (ref 70–99)
Potassium: 4 mEq/L (ref 3.5–5.1)
Sodium: 137 mEq/L (ref 135–145)
Total Bilirubin: 0.5 mg/dL (ref 0.2–1.2)
Total Protein: 6.9 g/dL (ref 6.0–8.3)

## 2018-06-22 LAB — LIPID PANEL
Cholesterol: 213 mg/dL — ABNORMAL HIGH (ref 0–200)
HDL: 45 mg/dL (ref >39.00)
LDL Cholesterol: 141 mg/dL — ABNORMAL HIGH (ref 0–99)
NonHDL: 167.59
Total CHOL/HDL Ratio: 5
Triglycerides: 131 mg/dL (ref 0.0–149.0)
VLDL: 26.2 mg/dL (ref 0.0–40.0)

## 2018-06-22 LAB — URINALYSIS, ROUTINE W REFLEX MICROSCOPIC
Bilirubin Urine: NEGATIVE
Hgb urine dipstick: NEGATIVE
Ketones, ur: NEGATIVE
Nitrite: NEGATIVE
Specific Gravity, Urine: 1.02 (ref 1.000–1.030)
Total Protein, Urine: NEGATIVE
Urine Glucose: NEGATIVE
Urobilinogen, UA: 0.2 (ref 0.0–1.0)
pH: 7 (ref 5.0–8.0)

## 2018-06-22 LAB — MICROALBUMIN / CREATININE URINE RATIO
Creatinine,U: 137.2 mg/dL
Microalb Creat Ratio: 1.4 mg/g (ref 0.0–30.0)
Microalb, Ur: 2 mg/dL — ABNORMAL HIGH (ref 0.0–1.9)

## 2018-06-22 LAB — CBC
HCT: 41.1 % (ref 36.0–46.0)
Hemoglobin: 14 g/dL (ref 12.0–15.0)
MCHC: 34 g/dL (ref 30.0–36.0)
MCV: 77.6 fl — ABNORMAL LOW (ref 78.0–100.0)
Platelets: 258 10*3/uL (ref 150.0–400.0)
RBC: 5.3 Mil/uL — ABNORMAL HIGH (ref 3.87–5.11)
RDW: 13.7 % (ref 11.5–15.5)
WBC: 6.5 10*3/uL (ref 4.0–10.5)

## 2018-06-22 LAB — LDL CHOLESTEROL, DIRECT: Direct LDL: 144 mg/dL

## 2018-06-22 LAB — GAMMA GT: GGT: 19 U/L (ref 7–51)

## 2018-06-22 LAB — HEMOGLOBIN A1C: Hgb A1c MFr Bld: 9.5 % — ABNORMAL HIGH (ref 4.6–6.5)

## 2018-06-22 LAB — TSH: TSH: 1.52 u[IU]/mL (ref 0.35–4.50)

## 2018-06-22 MED ORDER — ROSUVASTATIN CALCIUM 20 MG PO TABS: 20.0000 mg | ORAL_TABLET | Freq: Every day | ORAL | 1 refills | Status: DC
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
## 2018-06-23 ENCOUNTER — Other Ambulatory Visit: Payer: Self-pay

## 2018-06-23 DIAGNOSIS — E0865 Diabetes mellitus due to underlying condition with hyperglycemia: Secondary | ICD-10-CM | POA: Insufficient documentation

## 2018-06-23 DIAGNOSIS — E119 Type 2 diabetes mellitus without complications: Secondary | ICD-10-CM

## 2018-06-23 DIAGNOSIS — E7849 Other hyperlipidemia: Secondary | ICD-10-CM

## 2018-06-23 DIAGNOSIS — Z8673 Personal history of transient ischemic attack (TIA), and cerebral infarction without residual deficits: Secondary | ICD-10-CM

## 2018-06-23 DIAGNOSIS — R0989 Other specified symptoms and signs involving the circulatory and respiratory systems: Secondary | ICD-10-CM

## 2018-06-23 LAB — HEPATITIS B SURFACE ANTIGEN: Hepatitis B Surface Ag: NONREACTIVE

## 2018-06-23 MED ORDER — DAPAGLIFLOZIN PROPANEDIOL 10 MG PO TABS: 10.0000 mg | ORAL_TABLET | Freq: Every day | ORAL | 2 refills | Status: DC

## 2018-06-23 MED ORDER — ROSUVASTATIN CALCIUM 20 MG PO TABS: 20.0000 mg | ORAL_TABLET | Freq: Every day | ORAL | 1 refills | Status: DC

## 2018-06-23 MED ORDER — METFORMIN HCL 1000 MG PO TABS: 1000.0000 mg | ORAL_TABLET | Freq: Two times a day (BID) | ORAL | 1 refills | Status: DC

## 2018-06-23 NOTE — Addendum Note (Signed)
Addended by: Jon Billings on: 06/23/2018 10:41 AM   Modules accepted: Orders

## 2018-06-26 ENCOUNTER — Other Ambulatory Visit: Payer: Self-pay

## 2018-06-26 ENCOUNTER — Encounter: Payer: Medicare Other | Attending: Physical Medicine & Rehabilitation | Admitting: Physical Medicine & Rehabilitation

## 2018-06-26 ENCOUNTER — Encounter: Payer: Self-pay | Admitting: Physical Medicine & Rehabilitation

## 2018-06-26 VITALS — BP 145/82 | HR 79 | Temp 97.9°F | Ht 64.5 in | Wt 179.0 lb

## 2018-06-26 DIAGNOSIS — I69354 Hemiplegia and hemiparesis following cerebral infarction affecting left non-dominant side: Secondary | ICD-10-CM | POA: Insufficient documentation

## 2018-06-26 DIAGNOSIS — G8112 Spastic hemiplegia affecting left dominant side: Secondary | ICD-10-CM | POA: Diagnosis not present

## 2018-06-26 DIAGNOSIS — Z8249 Family history of ischemic heart disease and other diseases of the circulatory system: Secondary | ICD-10-CM | POA: Diagnosis not present

## 2018-06-26 DIAGNOSIS — E119 Type 2 diabetes mellitus without complications: Secondary | ICD-10-CM | POA: Insufficient documentation

## 2018-06-26 DIAGNOSIS — Z800 Family history of malignant neoplasm of digestive organs: Secondary | ICD-10-CM | POA: Insufficient documentation

## 2018-06-26 DIAGNOSIS — I10 Essential (primary) hypertension: Secondary | ICD-10-CM | POA: Diagnosis not present

## 2018-06-26 DIAGNOSIS — E785 Hyperlipidemia, unspecified: Secondary | ICD-10-CM | POA: Diagnosis not present

## 2018-06-26 DIAGNOSIS — Z87891 Personal history of nicotine dependence: Secondary | ICD-10-CM | POA: Diagnosis not present

## 2018-06-26 NOTE — Progress Notes (Signed)
Botox Injection for spasticity using needle EMG guidance  Dilution: 50 Units/ml Indication: Severe spasticity which interferes with ADL,mobility and/or  hygiene and is unresponsive to medication management and other conservative care Informed consent was obtained after describing risks and benefits of the procedure with the patient. This includes bleeding, bruising, infection, excessive weakness, or medication side effects. A REMS form is on file and signed. Needle: 27g 1" needle electrode Number of units per muscle  PT 25U 0-1+ FCR25 0-1+ FCU0 FDS50  1+ FDP50 1-2+ FPL50 1+  All injections were done after obtaining appropriate EMG activity and after negative drawback for blood. The patient tolerated the procedure well. Post procedure instructions were given. A followup appointment was made.  May be able to reduce dose to 100U based on EMG activity Consider FDS 25, FPL 25. FDP 50

## 2018-06-26 NOTE — Patient Instructions (Signed)

## 2018-06-27 ENCOUNTER — Ambulatory Visit: Payer: Medicare Other | Admitting: Obstetrics & Gynecology

## 2018-06-27 ENCOUNTER — Ambulatory Visit: Payer: Medicare Other | Admitting: Physical Therapy

## 2018-06-27 ENCOUNTER — Ambulatory Visit: Payer: BLUE CROSS/BLUE SHIELD | Admitting: Neurology

## 2018-06-27 ENCOUNTER — Encounter: Payer: Self-pay | Admitting: Physical Therapy

## 2018-06-27 DIAGNOSIS — G8929 Other chronic pain: Secondary | ICD-10-CM

## 2018-06-27 DIAGNOSIS — M545 Low back pain: Secondary | ICD-10-CM | POA: Diagnosis not present

## 2018-06-27 DIAGNOSIS — R262 Difficulty in walking, not elsewhere classified: Secondary | ICD-10-CM
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
## 2018-06-28 ENCOUNTER — Ambulatory Visit (HOSPITAL_BASED_OUTPATIENT_CLINIC_OR_DEPARTMENT_OTHER): Payer: Medicare Other

## 2018-06-28 ENCOUNTER — Ambulatory Visit (INDEPENDENT_AMBULATORY_CARE_PROVIDER_SITE_OTHER): Payer: Medicare Other | Admitting: *Deleted

## 2018-06-28 DIAGNOSIS — I639 Cerebral infarction, unspecified: Secondary | ICD-10-CM | POA: Diagnosis not present

## 2018-06-28 LAB — CUP PACEART REMOTE DEVICE CHECK
Date Time Interrogation Session: 20200616213945
Implantable Pulse Generator Implant Date: 20170829

## 2018-06-28 NOTE — Addendum Note (Signed)
Addended by: Rodrigo Ran on: 06/28/2018 09:53 AM   Modules accepted: Orders

## 2018-06-30 ENCOUNTER — Ambulatory Visit: Payer: BLUE CROSS/BLUE SHIELD | Admitting: Neurology

## 2018-07-12 NOTE — Progress Notes (Signed)
Carelink Summary Report / Loop Recorder 

## 2018-07-17 ENCOUNTER — Telehealth: Payer: Self-pay

## 2018-07-17 NOTE — Telephone Encounter (Signed)

## 2018-07-18 ENCOUNTER — Ambulatory Visit (INDEPENDENT_AMBULATORY_CARE_PROVIDER_SITE_OTHER): Payer: Medicare Other | Admitting: Family Medicine

## 2018-07-18 ENCOUNTER — Encounter: Payer: Self-pay | Admitting: Family Medicine

## 2018-07-18 VITALS — BP 110/70 | HR 101 | Ht 64.5 in | Wt 174.4 lb

## 2018-07-18 DIAGNOSIS — E119 Type 2 diabetes mellitus without complications: Secondary | ICD-10-CM | POA: Diagnosis not present

## 2018-07-18 DIAGNOSIS — E0865 Diabetes mellitus due to underlying condition with hyperglycemia: Secondary | ICD-10-CM

## 2018-07-18 MED ORDER — CANAGLIFLOZIN 100 MG PO TABS: 100.0000 mg | ORAL_TABLET | Freq: Every day | ORAL | 2 refills | Status: DC

## 2018-07-18 MED ORDER — METFORMIN HCL 1000 MG PO TABS: 1000.0000 mg | ORAL_TABLET | Freq: Two times a day (BID) | ORAL | 1 refills | Status: DC
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
## 2018-07-20 ENCOUNTER — Ambulatory Visit
Admission: RE | Admit: 2018-07-20 | Discharge: 2018-07-20 | Disposition: A | Discharge status: Home / Self Care | Payer: Medicare Other | Source: Ambulatory Visit | Attending: Family Medicine | Admitting: Family Medicine

## 2018-07-20 DIAGNOSIS — R0989 Other specified symptoms and signs involving the circulatory and respiratory systems: Secondary | ICD-10-CM

## 2018-07-20 DIAGNOSIS — Z8673 Personal history of transient ischemic attack (TIA), and cerebral infarction without residual deficits: Secondary | ICD-10-CM

## 2018-07-21 DIAGNOSIS — I6522 Occlusion and stenosis of left carotid artery: Secondary | ICD-10-CM | POA: Insufficient documentation

## 2018-07-21 NOTE — Addendum Note (Signed)
Addended by: Jon Billings on: 07/21/2018 12:27 PM   Modules accepted: Orders

## 2018-07-24 ENCOUNTER — Other Ambulatory Visit: Payer: Self-pay | Admitting: Family Medicine

## 2018-07-24 DIAGNOSIS — I10 Essential (primary) hypertension: Secondary | ICD-10-CM

## 2018-07-24 NOTE — Telephone Encounter (Signed)
Pt sees dr Ethelene Hal

## 2018-07-25 ENCOUNTER — Telehealth: Payer: Self-pay | Admitting: Family Medicine

## 2018-07-25 NOTE — Telephone Encounter (Signed)
Copied from [place] [id number]. Topic: General - Other >> Jul 25, 2018 11:35 AM Keene Breath wrote: Reason for CRM: Patient called to speak with a nurse regarding her test results.  Please call patient as soon as possible at [phone]

## 2018-07-25 NOTE — Telephone Encounter (Signed)
I called and spoke with pt. I answered all of her questions and she verbalized understanding. She is aware that the vascular office will call her to set up the appointment.

## 2018-07-26 ENCOUNTER — Telehealth: Payer: Self-pay | Admitting: *Deleted

## 2018-07-26 ENCOUNTER — Ambulatory Visit: Payer: Medicare Other | Admitting: *Deleted

## 2018-07-26 NOTE — Telephone Encounter (Signed)
Patient did not come for Diabetes Education appointment today. I attempted to call her when I saw in MD notes she had transportation concerns and would need a telephone visit. Patient did not answer phone either. Unable to leave a message.

## 2018-07-31 ENCOUNTER — Ambulatory Visit (INDEPENDENT_AMBULATORY_CARE_PROVIDER_SITE_OTHER): Payer: Medicare Other | Admitting: *Deleted

## 2018-07-31 ENCOUNTER — Encounter: Payer: Self-pay | Admitting: Family Medicine

## 2018-07-31 DIAGNOSIS — I639 Cerebral infarction, unspecified: Secondary | ICD-10-CM

## 2018-07-31 LAB — HM DIABETES EYE EXAM

## 2018-07-31 LAB — CUP PACEART REMOTE DEVICE CHECK
Date Time Interrogation Session: 20200719221423
Implantable Pulse Generator Implant Date: 20170829

## 2018-08-07 ENCOUNTER — Encounter: Payer: Self-pay | Admitting: Physical Medicine & Rehabilitation

## 2018-08-07 ENCOUNTER — Other Ambulatory Visit: Payer: Self-pay

## 2018-08-07 ENCOUNTER — Encounter: Payer: Medicare Other | Attending: Physical Medicine & Rehabilitation | Admitting: Physical Medicine & Rehabilitation

## 2018-08-07 VITALS — BP 115/80 | HR 78 | Temp 97.8°F | Ht 64.0 in | Wt 174.0 lb

## 2018-08-07 DIAGNOSIS — E119 Type 2 diabetes mellitus without complications: Secondary | ICD-10-CM | POA: Insufficient documentation

## 2018-08-07 DIAGNOSIS — Z8249 Family history of ischemic heart disease and other diseases of the circulatory system: Secondary | ICD-10-CM | POA: Insufficient documentation

## 2018-08-07 DIAGNOSIS — Z87891 Personal history of nicotine dependence: Secondary | ICD-10-CM | POA: Diagnosis not present

## 2018-08-07 DIAGNOSIS — G8112 Spastic hemiplegia affecting left dominant side: Secondary | ICD-10-CM | POA: Diagnosis not present

## 2018-08-07 DIAGNOSIS — Z800 Family history of malignant neoplasm of digestive organs: Secondary | ICD-10-CM | POA: Diagnosis not present

## 2018-08-07 DIAGNOSIS — I10 Essential (primary) hypertension: Secondary | ICD-10-CM | POA: Insufficient documentation

## 2018-08-07 DIAGNOSIS — G8929 Other chronic pain: Secondary | ICD-10-CM | POA: Diagnosis not present

## 2018-08-07 DIAGNOSIS — M533 Sacrococcygeal disorders, not elsewhere classified: Secondary | ICD-10-CM

## 2018-08-07 DIAGNOSIS — I69354 Hemiplegia and hemiparesis following cerebral infarction affecting left non-dominant side: Secondary | ICD-10-CM | POA: Diagnosis present

## 2018-08-07 DIAGNOSIS — E785 Hyperlipidemia, unspecified: Secondary | ICD-10-CM | POA: Diagnosis not present
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
## 2018-08-07 NOTE — Patient Instructions (Signed)
Sacroiliac Joint Dysfunction  Sacroiliac joint dysfunction is a condition that causes inflammation on one or both sides of the sacroiliac (SI) joint. The SI joint connects the lower part of the spine (sacrum) with the two upper portions of the pelvis (ilium). This condition causes deep aching or burning pain in the low back. In some cases, the pain may also spread into one or both buttocks, hips, or thighs. What are the causes? This condition may be caused by:  Pregnancy. During pregnancy, extra stress is put on the SI joints because the pelvis widens.  Injury, such as: ? Injuries from car accidents. ? Sports-related injuries. ? Work-related injuries.  Having one leg that is shorter than the other.  Conditions that affect the joints, such as: ? Rheumatoid arthritis. ? Gout. ? Psoriatic arthritis. ? Joint infection (septic arthritis). Sometimes, the cause of SI joint dysfunction is not known. What are the signs or symptoms? Symptoms of this condition include:  Aching or burning pain in the lower back. The pain may also spread to other areas, such as: ? Buttocks. ? Groin. ? Thighs.  Muscle spasms in or around the painful areas.  Increased pain when standing, walking, running, stair climbing, bending, or lifting. How is this diagnosed? This condition is diagnosed with a physical exam and medical history. During the exam, the health care provider may move one or both of your legs to different positions to check for pain. Various tests may be done to confirm the diagnosis, including:  Imaging tests to look for other causes of pain. These may include: ? MRI. ? CT scan. ? Bone scan.  Diagnostic injection. A numbing medicine is injected into the SI joint using a needle. If your pain is temporarily improved or stopped after the injection, this can indicate that SI joint dysfunction is the problem. How is this treated? Treatment depends on the cause and severity of your condition.  Treatment options may include:  Ice or heat applied to the lower back area after an injury. This may help reduce pain and muscle spasms.  Medicines to relieve pain or inflammation or to relax the muscles.  Wearing a back brace (sacroiliac brace) to help support the joint while your back is healing.  Physical therapy to increase muscle strength around the joint and flexibility at the joint. This may also involve learning proper body positions and ways of moving to relieve stress on the joint.  Direct manipulation of the SI joint.  Injections of steroid medicine into the joint to reduce pain and swelling.  Radiofrequency ablation to burn away nerves that are carrying pain messages from the joint.  Use of a device that provides electrical stimulation to help reduce pain at the joint.  Surgery to put in screws and plates that limit or prevent joint motion. This is rare. Follow these instructions at home: Medicines  Take over-the-counter and prescription medicines only as told by your health care provider.  Do not drive or use heavy machinery while taking prescription pain medicine.  If you are taking prescription pain medicine, take actions to prevent or treat constipation. Your health care provider may recommend that you: ? Drink enough fluid to keep your urine pale yellow. ? Eat foods that are high in fiber, such as fresh fruits and vegetables, whole grains, and beans. ? Limit foods that are high in fat and processed sugars, such as fried or sweet foods. ? Take an over-the-counter or prescription medicine for constipation. If you have a brace:    Wear the brace as told by your health care provider. Remove it only as told by your health care provider.  Keep the brace clean.  If the brace is not waterproof: ? Do not let it get wet. ? Cover it with a watertight covering when you take a bath or a shower. Managing pain, stiffness, and swelling      Icing can help with pain and  swelling. Heat may help with muscle tension or spasms. Ask your health care provider if you should use ice or heat.  If directed, put ice on the affected area: ? If you have a removable brace, remove it as told by your health care provider. ? Put ice in a plastic bag. ? Place a towel between your skin and the bag. ? Leave the ice on for 20 minutes, 2-3 times a day.  If directed, apply heat to the affected area. Use the heat source that your health care provider recommends, such as a moist heat pack or a heating pad. ? Place a towel between your skin and the heat source. ? Leave the heat on for 20-30 minutes. ? Remove the heat if your skin turns bright red. This is especially important if you are unable to feel pain, heat, or cold. You may have a greater risk of getting burned. General instructions  Rest as needed. Ask your health care provider what activities are safe for you.  Return to your normal activities as told by your health care provider.  Exercise as directed by your health care provider or physical therapist.  Do not use any products that contain nicotine or tobacco, such as cigarettes and e-cigarettes. These can delay bone healing. If you need help quitting, ask your health care provider.  Keep all follow-up visits as told by your health care provider. This is important. Contact a health care provider if:  Your pain is not controlled with medicine.  You have a fever.  Your pain is getting worse. Get help right away if:  You have weakness, numbness, or tingling in your legs or feet.  You lose control of your bladder or bowel. Summary  Sacroiliac joint dysfunction is a condition that causes inflammation on one or both sides of the sacroiliac (SI) joint.  This condition causes deep aching or burning pain in the low back. In some cases, the pain may also spread into one or both buttocks, hips, or thighs.  Treatment depends on the cause and severity of your condition.  It may include medicines to reduce pain and swelling or to relax muscles. This information is not intended to replace advice given to you by your health care provider. Make sure you discuss any questions you have with your health care provider. Document Released: 03/26/2008 Document Revised: 08/24/2017 Document Reviewed: 02/07/2017 Elsevier Patient Education  2020 [employer]  

## 2018-08-08 ENCOUNTER — Other Ambulatory Visit: Payer: Self-pay

## 2018-08-09 ENCOUNTER — Ambulatory Visit: Payer: Medicare Other | Admitting: Obstetrics & Gynecology

## 2018-08-10 ENCOUNTER — Ambulatory Visit (INDEPENDENT_AMBULATORY_CARE_PROVIDER_SITE_OTHER): Payer: Medicare Other | Admitting: Obstetrics & Gynecology

## 2018-08-10 ENCOUNTER — Encounter: Payer: Self-pay | Admitting: Obstetrics & Gynecology

## 2018-08-10 ENCOUNTER — Other Ambulatory Visit: Payer: Self-pay

## 2018-08-10 VITALS — BP 132/86

## 2018-08-10 DIAGNOSIS — N9489 Other specified conditions associated with female genital organs and menstrual cycle: Secondary | ICD-10-CM

## 2018-08-10 DIAGNOSIS — D219 Benign neoplasm of connective and other soft tissue, unspecified: Secondary | ICD-10-CM | POA: Diagnosis not present

## 2018-08-10 NOTE — Patient Instructions (Signed)
1. Adnexal mass Right adnexal mass per pelvic ultrasound May 2019 and CT scan of the pelvis July 2019.  On pelvic ultrasound the mass was described as a simple cyst measuring 7.7 cm and on CT scan it was measured at 7.4 cm with peripheral calcification.  Patient is completely asymptomatic with no pain, no weight loss and no vaginal bleeding.  On gynecologic exam today no defined mass was felt.  We will do a Ca125 and CEA today.  Patient will follow-up for a pelvic ultrasound.  Will decide if a pelvic MRI is warranted based on pelvic ultrasound and tumor marker results.  Findings and management plan discussed in details with patient.  Patient voiced understanding and agreement. - CA 125 - CEA - US Transvaginal Non-OB; Future  2. Fibroids 2 small asymptomatic uterine fibroids visualized on pelvic ultrasound May 2019. - US Transvaginal Non-OB; Future  Gillian, it was a pleasure meeting you today!  I will inform you of your results as soon as they are available.

## 2018-08-10 NOTE — Progress Notes (Signed)
Wendy [name] Jan 01, 1956 [id number]   History:    63 y.o. G1P1L1 Divorced  RP:  Consultation for Pelvic Mass x 2019  HPI: Postmenopause on no HRT x many years.  No PMB.  No pelvic pain.  No weight loss.  Urine/BMs normal.  Abstinent.  Known uterine Fibroids for which patient had Myomectomies by laparotomy many years ago.  A Pelvic US was done on 05/18/2017 to f/u on a Rt Ovarian Cyst.  A Rt adnexal simple cyst measuring 7.7 cm was seen.  A CT scan on 07/22/2017 showed a 7.4 cm mass with peripheral calcifications in the Rt adnexa.  Following those radiologic studies, patient was seen by a physician who told her that it could be observed given that it was getting smaller.  Her PCP Dr Ethelene Hal is sending her here in consultation for reevaluation/second opinion.  Past medical history,surgical history, family history and social history were all reviewed and documented in the EPIC chart.  Gynecologic History No LMP recorded. Patient is postmenopausal. Contraception: abstinence and post menopausal status Last Pap: 08/2017. Results were: Negative Last mammogram: 04/2016. Results were: Negative Colonoscopy: 05/2017  Obstetric History OB History  Gravida Para Term Preterm AB Living  1 1       1   SAB TAB Ectopic Multiple Live Births               # Outcome Date GA Lbr Len/2nd Weight Sex Delivery Anes PTL Lv  1 Para              ROS: A ROS was performed and pertinent positives and negatives are included in the history.  GENERAL: No fevers or chills. HEENT: No change in vision, no earache, sore throat or sinus congestion. NECK: No pain or stiffness. CARDIOVASCULAR: No chest pain or pressure. No palpitations. PULMONARY: No shortness of breath, cough or wheeze. GASTROINTESTINAL: No abdominal pain, nausea, vomiting or diarrhea, melena or bright red blood per rectum. GENITOURINARY: No urinary frequency, urgency, hesitancy or dysuria. MUSCULOSKELETAL: No joint or muscle pain, no back pain, no recent trauma.  DERMATOLOGIC: No rash, no itching, no lesions. ENDOCRINE: No polyuria, polydipsia, no heat or cold intolerance. No recent change in weight. HEMATOLOGICAL: No anemia or easy bruising or bleeding. NEUROLOGIC: No headache, seizures, numbness, tingling or weakness. PSYCHIATRIC: No depression, no loss of interest in normal activity or change in sleep pattern.     Exam:   BP 132/86   There is no height or weight on file to calculate BMI.  General appearance : Well developed well nourished female. No acute distress  Abdomen: no palpable masses or tenderness, no rebound or guarding  Extremities: no edema or skin discoloration or tenderness  Pelvic: Vulva: Normal             Vagina: No gross lesions or discharge  Cervix: No gross lesions or discharge  Uterus  AV, mild increase in size, non-tender and mobile  Adnexa  Without masses felt or tenderness  Anus: Normal  Pelvic US 05/18/2017: Uterus: Measurements: 7.9 x 5.4 x 4.9 cm. 2.8 cm fibroid is noted anteriorly in the fundus. 1.9 cm fibroid is noted posteriorly. Endometrium Thickness: 2 mm which is within normal limits. No focal abnormality visualized. Right ovary: Not visualized.  7.7 cm simple right adnexal cyst is noted. Left ovary: Not visualized.  No abnormal free fluid.  CT Abdo-Pelvis 07/22/2017: Reproductive: The patient has 2 known fibroids in the uterus. While they are seen on this study, they were better visualized  on recent ultrasound. The left ovary is not discretely seen. There is a mass in the right adnexa. This mass measures 7.4 by 6.7 by 6.8 cm. There is a calcification along the periphery of this mass where it abuts the uterus. The remainder of the right ovary may be seen posterior to this mass   Assessment/Plan:  63 y.o. female for annual exam   1. Adnexal mass Right adnexal mass per pelvic ultrasound May 2019 and CT scan of the pelvis July 2019.  On pelvic ultrasound the mass was described as a simple cyst measuring 7.7 cm and  on CT scan it was measured at 7.4 cm with peripheral calcification.  Patient is completely asymptomatic with no pain, no weight loss and no vaginal bleeding.  On gynecologic exam today no defined mass was felt.  We will do a Ca125 and CEA today.  Patient will follow-up for a pelvic ultrasound.  Will decide if a pelvic MRI is warranted based on pelvic ultrasound and tumor marker results.  Findings and management plan discussed in details with patient.  Patient voiced understanding and agreement. - CA 125 - CEA - US Transvaginal Non-OB; Future  2. Fibroids 2 small asymptomatic uterine fibroids visualized on pelvic ultrasound May 2019. - US Transvaginal Non-OB; Future  Counseling on above issues and coordination of care more than 50% for 45 minutes.  Princess Bruins MD, 4:14 PM 08/10/2018

## 2018-08-11 ENCOUNTER — Other Ambulatory Visit: Payer: Self-pay | Admitting: Family Medicine

## 2018-08-11 DIAGNOSIS — E7849 Other hyperlipidemia: Secondary | ICD-10-CM

## 2018-08-11 DIAGNOSIS — Z8673 Personal history of transient ischemic attack (TIA), and cerebral infarction without residual deficits: Secondary | ICD-10-CM

## 2018-08-11 DIAGNOSIS — R0989 Other specified symptoms and signs involving the circulatory and respiratory systems: Secondary | ICD-10-CM

## 2018-08-11 MED ORDER — ROSUVASTATIN CALCIUM 20 MG PO TABS: 20.0000 mg | ORAL_TABLET | Freq: Every day | ORAL | 1 refills | Status: DC

## 2018-08-11 NOTE — Telephone Encounter (Signed)
Medication Refill - Medication:  rosuvastatin (CRESTOR) 20 MG tablet  Has the patient contacted their pharmacy? Yes advised to call office.   Preferred Pharmacy (with phone number or street name):  [facility] ([place]), [place] - [address] [phone] (Phone) [phone] (Fax)   Agent: Please be advised that RX refills may take up to 3 business days. We ask that you follow-up with your pharmacy.

## 2018-08-11 NOTE — Telephone Encounter (Signed)
Rx sent in

## 2018-08-14 LAB — CEA: CEA: 2 ng/mL

## 2018-08-14 LAB — CA 125: CA 125: 12 U/mL (ref <35)

## 2018-08-15 ENCOUNTER — Other Ambulatory Visit: Payer: Self-pay

## 2018-08-15 NOTE — Progress Notes (Signed)
Carelink Summary Report / Loop Recorder 

## 2018-08-16 ENCOUNTER — Other Ambulatory Visit: Payer: Self-pay | Admitting: Obstetrics & Gynecology

## 2018-08-16 ENCOUNTER — Ambulatory Visit (INDEPENDENT_AMBULATORY_CARE_PROVIDER_SITE_OTHER): Payer: Medicare Other | Admitting: Obstetrics & Gynecology

## 2018-08-16 ENCOUNTER — Ambulatory Visit: Payer: Medicare Other

## 2018-08-16 DIAGNOSIS — D219 Benign neoplasm of connective and other soft tissue, unspecified: Secondary | ICD-10-CM

## 2018-08-16 DIAGNOSIS — R1900 Intra-abdominal and pelvic swelling, mass and lump, unspecified site: Secondary | ICD-10-CM

## 2018-08-16 DIAGNOSIS — N838 Other noninflammatory disorders of ovary, fallopian tube and broad ligament: Secondary | ICD-10-CM | POA: Diagnosis not present

## 2018-08-16 DIAGNOSIS — N9489 Other specified conditions associated with female genital organs and menstrual cycle: Secondary | ICD-10-CM

## 2018-08-16 DIAGNOSIS — N83202 Unspecified ovarian cyst, left side: Secondary | ICD-10-CM | POA: Diagnosis not present

## 2018-08-16 NOTE — Progress Notes (Signed)
    Wendy [name] October 15, 1955 [id number]        63 y.o.  G1P1L1 Divorced  RP: Right adnexal mass in 2019 per Pelvic US/CT scan   HPI:  Pelvic US 05/2017 showed a 7.7 cm Rt adnexal mass.  A CT scan in 07/2017 showed a 7.4 Rt adnexal mass.  Patient is asymptomatic, with no abdominopelvic pain.  Abstinent.  Menopause.  No PMB.  No HRT.   OB History  Gravida Para Term Preterm AB Living  1 1       1   SAB TAB Ectopic Multiple Live Births               # Outcome Date GA Lbr Len/2nd Weight Sex Delivery Anes PTL Lv  1 Para             Past medical history,surgical history, problem list, medications, allergies, family history and social history were all reviewed and documented in the EPIC chart.   Directed ROS with pertinent positives and negatives documented in the history of present illness/assessment and plan.  Exam:  There were no vitals filed for this visit. General appearance:  Normal  Pelvic US today: T/V and T/A images.  Anteverted uterus measuring 7.7 x 6.27 x 4.98 cm.  Intramural fibroids measured at 2.8 x 1.9 cm, 3.4 x 2.6 cm, 2.4 x 2.5 cm.  Endometrial lining thin at 3.0 mm.  Right ovary with scattered microcalcifications with an echo-free cyst measured at 9 mm.  Left ovary not seen.  Left adnexal mass measured at 8.0 x 3.5 x 7.5 cm.  Primarily echo-free with solid calcified focal mass measured at 2.7 x 1.9 x 2.6 cm with positive color flow Doppler.  Arterial blood flow seen to the left adnexal mass with is probably a left ovarian cyst.  No free fluid in the posterior cul-de-sac.  CEA was normal at 2.0 and Ca1 25 normal at 12 on August 11, 2018.   Assessment/Plan:  62 y.o. G1P1   1. Left ovarian cyst Pelvic ultrasound today revealing a left ovarian mass primarily echo-free cyst with solid calcified focal mass at the periphery measuring 2.7 x 1.9 x 2.6 cm with positive color flow Doppler.  No free fluid in the posterior cul-de-sac.  Otherwise the ultrasound reveals a normal-sized  uterus with intramural fibroids measured at 2.8 x 1.9 cm, 3.4 x 2.6 cm, 2.4 x 2.5 cm.  The endometrial lining is thin and normal at 3.0 mm.  The right ovary is normal size with scattered microcalcifications and an echo-free cyst measuring 9 mm. CEA was normal at 2.0 and Ca1 25 normal at 12 on August 11, 2018.  Refer to Gyn-Oncology for evaluation and management given the persistent of a left ovarian mass with a solid component in a 63 year old woman.  Patient agrees with plan.  Counseling on above issues and coordination of care more than 50% for 25 minutes.  Princess Bruins MD, 3:21 PM 08/16/2018

## 2018-08-21 ENCOUNTER — Encounter: Payer: Self-pay | Admitting: Obstetrics & Gynecology

## 2018-08-21 NOTE — Patient Instructions (Signed)
1. Left ovarian cyst Pelvic ultrasound today revealing a left ovarian mass primarily echo-free cyst with solid calcified focal mass at the periphery measuring 2.7 x 1.9 x 2.6 cm with positive color flow Doppler.  No free fluid in the posterior cul-de-sac.  Otherwise the ultrasound reveals a normal-sized uterus with intramural fibroids measured at 2.8 x 1.9 cm, 3.4 x 2.6 cm, 2.4 x 2.5 cm.  The endometrial lining is thin and normal at 3.0 mm.  The right ovary is normal size with scattered microcalcifications and an echo-free cyst measuring 9 mm. CEA was normal at 2.0 and Ca1 25 normal at 12 on August 11, 2018.  Refer to Gyn-Oncology for evaluation and management given the persistent of a left ovarian mass with a solid component in a 63 year old woman.  Patient agrees with plan.  Normal, it was a pleasure seeing you today!

## 2018-08-22 ENCOUNTER — Telehealth: Payer: Self-pay | Admitting: *Deleted

## 2018-08-22 NOTE — Telephone Encounter (Signed)
-----   Message from Princess Bruins, MD sent at 08/16/2018  3:15 PM EDT ----- Regarding: Refer to Gyn-Onco 63 yo with a Large left ovarian cyst with wall calcification.

## 2018-08-22 NOTE — Telephone Encounter (Signed)
Referral message sent to [facility] to let me know time and date to relay to patient.

## 2018-08-23 ENCOUNTER — Telehealth: Payer: Self-pay | Admitting: Neurology

## 2018-08-23 NOTE — Telephone Encounter (Signed)
Patient is needing a letter from Dr. Delice Lesch saying that she can return to school. She will pick this up. Thanks!

## 2018-08-23 NOTE — Telephone Encounter (Signed)
patient scheduled on 08/31/18 @ 10:45am with Dr. Skeet Latch. Patient informed with time and date.

## 2018-08-24 ENCOUNTER — Other Ambulatory Visit: Payer: Self-pay

## 2018-08-24 DIAGNOSIS — I6522 Occlusion and stenosis of left carotid artery: Secondary | ICD-10-CM

## 2018-08-24 NOTE — Telephone Encounter (Addendum)
Pt request a note to return back to school  Dr. Delice Lesch,  Does the pt have any restrictions at this time?  Pt states that the letter is for financial aid. States that she has hx of stroke and memory loss.

## 2018-08-25 NOTE — Telephone Encounter (Signed)
Left message for pt to return call.

## 2018-08-25 NOTE — Telephone Encounter (Signed)
Can you pls ask her what exactly needs to be in the letter aside from her diagnosis. When I last wrote letter, we asked for accommodations for a scribe. If she would like accommodations, please ask her what specifically to request for. Thanks

## 2018-08-28 ENCOUNTER — Telehealth: Payer: Self-pay | Admitting: Neurology

## 2018-08-28 ENCOUNTER — Other Ambulatory Visit: Payer: Self-pay

## 2018-08-28 DIAGNOSIS — G40209 Localization-related (focal) (partial) symptomatic epilepsy and epileptic syndromes with complex partial seizures, not intractable, without status epilepticus: Secondary | ICD-10-CM

## 2018-08-28 DIAGNOSIS — I10 Essential (primary) hypertension: Secondary | ICD-10-CM

## 2018-08-28 MED ORDER — LEVETIRACETAM 1000 MG PO TABS: 1000.0000 mg | ORAL_TABLET | Freq: Two times a day (BID) | ORAL | 0 refills | Status: DC

## 2018-08-28 NOTE — Telephone Encounter (Signed)
Pharm left VM and is calling in wanting a prescription for the levetiracetam 1000mg  medication. The fax #: [phone] and phone#: [phone]. Thanks!

## 2018-08-28 NOTE — Telephone Encounter (Signed)
Pt has an OV tomorrow.  Dr. Delice Lesch may be decreasing pts dosage. Only 1 refill will be sent.

## 2018-08-29 ENCOUNTER — Telehealth: Payer: Medicare Other | Admitting: Neurology

## 2018-08-29 ENCOUNTER — Encounter: Payer: Self-pay | Admitting: Neurology

## 2018-08-29 ENCOUNTER — Other Ambulatory Visit: Payer: Self-pay

## 2018-08-29 NOTE — Telephone Encounter (Signed)
Left message for pt to return call.

## 2018-08-29 NOTE — Telephone Encounter (Signed)
Spoke with pt today. She is not going to school at this time and does not need letter.

## 2018-08-30 ENCOUNTER — Telehealth: Payer: Self-pay | Admitting: Neurology

## 2018-08-30 ENCOUNTER — Encounter: Payer: Medicare Other | Attending: Family Medicine | Admitting: *Deleted

## 2018-08-30 ENCOUNTER — Telehealth: Payer: Self-pay

## 2018-08-30 MED ORDER — ASPIRIN EC 81 MG PO TBEC: 81.0000 mg | DELAYED_RELEASE_TABLET | Freq: Every day | ORAL | 2 refills | Status: DC

## 2018-08-30 NOTE — Telephone Encounter (Signed)
Rx sent in.    Copied from [place] [phone]. Topic: General - Other >> Aug 30, 2018  3:51 PM Leward Quan A wrote: Reason for CRM: Representative from [facility] called to say they received all Rxes for patient except the aspirin EC 81 MG tablet. Asking for Rx to be sent please. Ph# [phone]

## 2018-08-30 NOTE — Telephone Encounter (Signed)
Spoke with pt yesterday. She does not need forms forms for school because she is not going at this time. Her appt has been r/s to 9/1.
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
## 2018-08-30 NOTE — Telephone Encounter (Signed)
Patient left vm saying she was returning a call to the nurse. Thanks!
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
## 2018-08-31 ENCOUNTER — Encounter: Payer: Medicare Other | Attending: Physical Medicine & Rehabilitation | Admitting: Physical Medicine & Rehabilitation

## 2018-08-31 ENCOUNTER — Inpatient Hospital Stay: Payer: Medicare Other | Attending: Gynecologic Oncology | Admitting: Gynecologic Oncology

## 2018-08-31 ENCOUNTER — Other Ambulatory Visit: Payer: Self-pay

## 2018-08-31 VITALS — BP 124/73 | HR 77 | Temp 98.5°F | Resp 18 | Ht 64.5 in | Wt 160.0 lb

## 2018-08-31 DIAGNOSIS — N83202 Unspecified ovarian cyst, left side: Secondary | ICD-10-CM

## 2018-08-31 DIAGNOSIS — Z800 Family history of malignant neoplasm of digestive organs: Secondary | ICD-10-CM | POA: Diagnosis not present

## 2018-08-31 DIAGNOSIS — I69354 Hemiplegia and hemiparesis following cerebral infarction affecting left non-dominant side: Secondary | ICD-10-CM | POA: Insufficient documentation

## 2018-08-31 DIAGNOSIS — R140 Abdominal distension (gaseous): Secondary | ICD-10-CM

## 2018-08-31 DIAGNOSIS — E669 Obesity, unspecified: Secondary | ICD-10-CM

## 2018-08-31 DIAGNOSIS — E119 Type 2 diabetes mellitus without complications: Secondary | ICD-10-CM | POA: Insufficient documentation

## 2018-08-31 DIAGNOSIS — Z79899 Other long term (current) drug therapy: Secondary | ICD-10-CM | POA: Insufficient documentation

## 2018-08-31 DIAGNOSIS — Z8249 Family history of ischemic heart disease and other diseases of the circulatory system: Secondary | ICD-10-CM | POA: Diagnosis not present

## 2018-08-31 DIAGNOSIS — D398 Neoplasm of uncertain behavior of other specified female genital organs: Secondary | ICD-10-CM

## 2018-08-31 DIAGNOSIS — Z8673 Personal history of transient ischemic attack (TIA), and cerebral infarction without residual deficits: Secondary | ICD-10-CM

## 2018-08-31 DIAGNOSIS — E785 Hyperlipidemia, unspecified: Secondary | ICD-10-CM | POA: Diagnosis not present

## 2018-08-31 DIAGNOSIS — Z87891 Personal history of nicotine dependence: Secondary | ICD-10-CM | POA: Insufficient documentation

## 2018-08-31 DIAGNOSIS — M533 Sacrococcygeal disorders, not elsewhere classified: Secondary | ICD-10-CM

## 2018-08-31 DIAGNOSIS — I10 Essential (primary) hypertension: Secondary | ICD-10-CM | POA: Insufficient documentation

## 2018-08-31 DIAGNOSIS — Z7982 Long term (current) use of aspirin: Secondary | ICD-10-CM | POA: Insufficient documentation

## 2018-08-31 NOTE — Patient Instructions (Signed)
Sacroiliac injection was performed today. A combination of a naming medicine plus a cortisone medicine was injected. The injection was done under x-ray guidance. This procedure has been performed to help reduce low back and buttocks pain as well as potentially hip pain. The duration of this injection is variable lasting from hours to  Months. It may repeated if needed. 

## 2018-08-31 NOTE — Progress Notes (Signed)
Left sacroiliac injection under fluoroscopic guidance  Indication: Left Low back and buttocks pain not relieved by medication management and other conservative care.  Informed consent was obtained after describing risks and benefits of the procedure with the patient, this includes bleeding, bruising, infection, paralysis and medication side effects. The patient wishes to proceed and has given written consent. The patient was placed in a prone position. The lumbar and sacral area was marked and prepped with Betadine. A 25-gauge 1-1/2 inch needle was inserted into the skin and subcutaneous tissue and 1 mL of 1% lidocaine was injected. Then a 25-gauge 3 inch spinal needle was inserted under fluoroscopic guidance into the left sacroiliac joint. AP and lateral images were utilized. Isovue 200x0.5 mL under live fluoroscopy demonstrated no intravascular uptake. Then a solution containing one ML of 6 mg per mLbetamethasone and 2 ML of 2% lidocaine MPF was injected x1.5 mL. Patient tolerated the procedure well. Post procedure instructions were given. Please see post procedure form.  Preinjection pain level 5/10 Postinjection pain level 0/10

## 2018-08-31 NOTE — Progress Notes (Signed)
  PROCEDURE RECORD [facility]   Name: Wendy [name] DOB:04-05-1955 MRN: [id number]  Date:08/31/2018  Physician: Alysia Penna, MD    Nurse/CMA: Truman Hayward, CMA  Allergies:  Allergies  Allergen Reactions  . Hydrocodone-Ibuprofen   . Lisinopril Cough  . Vicodin [Hydrocodone-Acetaminophen] Palpitations    Consent Signed: Yes.    Is patient diabetic? Yes.    CBG today? Didn;t take  Pregnant: No. LMP: No LMP recorded. Patient is postmenopausal. (age 66-55)  Anticoagulants: yes (aspirin) Anti-inflammatory: no Antibiotics: no  Procedure: Left Sacroiliac Joint injection Position: Prone Start Time: 4:19pm End Time: 4:21pm Fluoro Time: 13  RN/CMA Truman Hayward, CMA Wendy [name], CMA    Time 4:07pm 4:29pm    BP 144/82 129/83    Pulse 93 101    Respirations 16 16    O2 Sat 96 96    S/S 6 6    Pain Level 5/10 0/10     D/C home with friend, patient A & O X 3, D/C instructions reviewed, and sits independently.

## 2018-08-31 NOTE — Patient Instructions (Signed)
We will contact you regarding the surgery date.  Either Aug 27 or September 28, 2018  Procedure, RA BSO possible hsyterectomy, staging  Thank you very much [name] for allowing me to provide care for you today.  I appreciate your confidence in choosing our Gynecologic Oncology team.  If you have any questions about your visit today please call our office and we will get back to you as soon as possible.  Please consider using the website Medlineplus.gov as an Geneticist, molecular.   Francetta Found. Grizelda Piscopo MD., PhD Gynecologic Oncology

## 2018-09-01 ENCOUNTER — Other Ambulatory Visit: Payer: Self-pay | Admitting: Gynecologic Oncology

## 2018-09-01 ENCOUNTER — Telehealth: Payer: Self-pay

## 2018-09-01 ENCOUNTER — Ambulatory Visit (INDEPENDENT_AMBULATORY_CARE_PROVIDER_SITE_OTHER): Payer: Medicare Other | Admitting: *Deleted

## 2018-09-01 DIAGNOSIS — I639 Cerebral infarction, unspecified: Secondary | ICD-10-CM

## 2018-09-01 DIAGNOSIS — I6389 Other cerebral infarction: Secondary | ICD-10-CM | POA: Diagnosis not present

## 2018-09-01 DIAGNOSIS — R1900 Intra-abdominal and pelvic swelling, mass and lump, unspecified site: Secondary | ICD-10-CM

## 2018-09-01 NOTE — Telephone Encounter (Signed)
Faxed surgical clearance form for completion. surgery scheduled 09/28/2018.

## 2018-09-01 NOTE — Telephone Encounter (Signed)
I spoke with Wendy [name] this am. She is agreeable to have surgery on 09/28/2018 with Dr. Skeet Latch. I told her the surgery would be performed at [facility]. She asked if she would be staying overnight. I told her that she will not be staying overnight. I told her that the pre surgical testing would be arrange by the hospital and they would call her with that appointment. I told her that Joylene John NP will need to see her prior to surgery for preop teaching. We will call the patient with that appointment. I instructed patient not to take aspirin the day before and the day of surgery. The patient verbalized understanding.

## 2018-09-03 LAB — CUP PACEART REMOTE DEVICE CHECK
Date Time Interrogation Session: 20200821221124
Implantable Pulse Generator Implant Date: 20170829

## 2018-09-05 ENCOUNTER — Other Ambulatory Visit: Payer: Self-pay

## 2018-09-05 ENCOUNTER — Ambulatory Visit (INDEPENDENT_AMBULATORY_CARE_PROVIDER_SITE_OTHER): Payer: Medicare Other | Admitting: Vascular Surgery

## 2018-09-05 ENCOUNTER — Encounter: Payer: Self-pay | Admitting: Vascular Surgery

## 2018-09-05 ENCOUNTER — Ambulatory Visit (HOSPITAL_COMMUNITY)
Admission: RE | Admit: 2018-09-05 | Discharge: 2018-09-05 | Disposition: A | Discharge status: Home / Self Care | Payer: Medicare Other | Source: Ambulatory Visit | Attending: Vascular Surgery | Admitting: Vascular Surgery

## 2018-09-05 VITALS — BP 141/77 | HR 62 | Temp 97.6°F | Resp 20 | Ht 64.0 in | Wt 175.5 lb

## 2018-09-05 DIAGNOSIS — I6522 Occlusion and stenosis of left carotid artery: Secondary | ICD-10-CM

## 2018-09-05 NOTE — Progress Notes (Signed)
[facility]  Patient name: Wendy [name] MRN: [id number] DOB: 1955/07/06 Sex: female  REASON FOR CONSULT: Here today for evaluation of left carotid stenosis.    HPI: Wendy [name] is a 63 y.o. female, who is here today for evaluation.  She recently moved back to this area from [place].  She reports that she was raised here.  She has a history of stroke while in [place].  She reports that she had a event affecting her left arm in March 2017.  She was evaluated and hospitalized for approximately 1 week.  She actually returned to work and then while at home in April of that year had a major stroke.  She was hospitalized for several weeks and then had several months of rehabilitation therapy.  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  Unfortunately she is left-handed.  She has had no new events since that time.  Apparently no obvious source for her stroke.  Is seen today for surveillance of her carotid arteries.  Past Medical History:  Diagnosis Date  . Blood transfusion without reported diagnosis   . Chicken pox   . Cough   . Depression   . Helicobacter pylori gastritis    treated 05/2017  . Hyperlipidemia   . Hypertension   . Left-sided weakness   . Stroke ([place]) 03/2015 and 04/2015   Pt had 2 strokes/weakness on the left side  . Type 2 diabetes mellitus ([place])    Patient states she is a boader line diabetic    Family History  Problem Relation Age of Onset  . Pancreatic cancer Mother 4  . Colon cancer Father 11  . Hypertension Father   . Heart attack Sister        died around age 57  . Stroke Maternal Grandmother   . Esophageal cancer Neg Hx   . Stomach cancer Neg Hx   . Rectal cancer Neg Hx     SOCIAL HISTORY: Social History   Socioeconomic History  . Marital status: Single    Spouse name: Not on file  . Number of children: 1  . Years of education: 67  . Highest education level: Not on file  Occupational  History  . Occupation: disabled  Social Needs  . Financial resource strain: Not on file  . Food insecurity    Worry: Not on file    Inability: Not on file  . Transportation needs    Medical: Not on file    Non-medical: Not on file  Tobacco Use  . Smoking status: Former Smoker    Packs/day: 0.25    Years: 10.00    Pack years: 2.50    Types: Cigarettes    Quit date: 01/12/2011    Years since quitting: 7.6  . Smokeless tobacco: Never Used  Substance and Sexual Activity  . Alcohol use: No  . Drug use: No  . Sexual activity: Not Currently    Comment: 1st intercourse- 16- partners- 5,   Lifestyle  . Physical activity    Days per week: Not on file    Minutes per session: Not on file  . Stress: Not on file  Relationships  . Social Herbalist on phone: Not on file    Gets together: Not on file    Attends religious service: Not on file    Active member of club or organization: Not on file    Attends meetings of clubs or organizations: Not on file  Relationship status: Not on file  . Intimate partner violence    Fear of current or ex partner: Not on file    Emotionally abused: Not on file    Physically abused: Not on file    Forced sexual activity: Not on file  Other Topics Concern  . Not on file  Social History Narrative   Lives alone in a one story home.  Has one daughter.  On disability.  Education: college.     Allergies  Allergen Reactions  . Hydrocodone-Ibuprofen   . Lisinopril Cough  . Vicodin [Hydrocodone-Acetaminophen] Palpitations    Current Outpatient Medications  Medication Sig Dispense Refill  . amLODipine (NORVASC) 10 MG tablet Take 1 tablet by mouth once daily 90 tablet 2  . aspirin EC 81 MG tablet Take 1 tablet (81 mg total) by mouth daily. 90 tablet 2  . levETIRAcetam (KEPPRA) 1000 MG tablet Take 1 tablet (1,000 mg total) by mouth 2 (two) times daily. 60 tablet 0  . metFORMIN (GLUCOPHAGE) 1000 MG tablet Take 1 tablet (1,000 mg total) by  mouth 2 (two) times daily with a meal. 180 tablet 1  . rosuvastatin (CRESTOR) 20 MG tablet Take 1 tablet (20 mg total) by mouth daily. 90 tablet 1   No current facility-administered medications for this visit.     REVIEW OF SYSTEMS:  [X]  denotes positive finding, [ ]  denotes negative finding Cardiac  Comments:  Chest pain or chest pressure:    Shortness of breath upon exertion:    Short of breath when lying flat:    Irregular heart rhythm:        Vascular    Pain in calf, thigh, or hip brought on by ambulation:    Pain in feet at night that wakes you up from your sleep:     Blood clot in your veins:    Leg swelling:         Pulmonary    Oxygen at home:    Productive cough:     Wheezing:         Neurologic    Sudden weakness in arms or legs:  x   Sudden numbness in arms or legs:  x   Sudden onset of difficulty speaking or slurred speech:    Temporary loss of vision in one eye:     Problems with dizziness:         Gastrointestinal    Blood in stool:     Vomited blood:         Genitourinary    Burning when urinating:     Blood in urine:        Psychiatric    Major depression:         Hematologic    Bleeding problems:    Problems with blood clotting too easily:        Skin    Rashes or ulcers:        Constitutional    Fever or chills:      PHYSICAL EXAM: Vitals:   09/05/18 0826 09/05/18 0828  BP: (!) 146/84 (!) 141/77  Pulse: 62   Resp: 20   Temp: 97.6 F (36.4 C)   SpO2: 96%   Weight: 79.6 kg   Height: 5\' 4"  (1.626 m)     GENERAL: The patient is a well-nourished female, in no acute distress. The vital signs are documented above. CARDIOVASCULAR: I do not hear carotid bruits bilaterally.  2+ radial pulses bilaterally.  Heart regular rate and  rhythm PULMONARY: There is good air exchange  ABDOMEN: Soft and non-tender  MUSCULOSKELETAL: There are no major deformities or cyanosis. NEUROLOGIC: No focal weakness or paresthesias are detected. SKIN: There  are no ulcers or rashes noted. PSYCHIATRIC: The patient has a normal affect.  DATA:  Duplex from [facility] imaging revealed moderate left carotid stenosis and no significant stenosis on the right.  She had repeat today in our office of her left carotid and we do not see any significant narrowing.  She does have extensive irregular plaque but no flow limitation with predicted 1 to 39% stenosis.  MEDICAL ISSUES: I discussed this at length with the patient.  I do not see any evidence of significant carotid disease either in her prior study from 07/20/2018 or today.  I would recommend yearly surveillance due to her extensive plaque and prior history of major stroke.  Do not see any contraindication to upcoming GYN surgery.   Rosetta Posner, MD FACS [facility] Tel [phone] Pager [phone]

## 2018-09-06 ENCOUNTER — Telehealth: Payer: Self-pay | Admitting: Oncology

## 2018-09-06 NOTE — Telephone Encounter (Signed)
Called Dr. Bebe Shaggy office at [facility] twice regarding moving patient's 09/18/18 appointment up and was not able to speak to anyone.

## 2018-09-08 NOTE — Progress Notes (Signed)
Carelink Summary Report / Loop Recorder 

## 2018-09-08 NOTE — Progress Notes (Signed)
No-show for 08/29/2018 visit

## 2018-09-12 ENCOUNTER — Telehealth: Payer: Self-pay | Admitting: *Deleted

## 2018-09-12 ENCOUNTER — Telehealth: Payer: Medicare Other | Admitting: Neurology

## 2018-09-12 ENCOUNTER — Other Ambulatory Visit: Payer: Self-pay

## 2018-09-12 NOTE — Telephone Encounter (Signed)
Called the patient to schedule a pre-op appt with Melissa APP, patient asked "Can you work this appt around my dental appt. I have a cleaning on 9/16. It took me two months to get this appt." Explained that I would talk with Melissa APP and call her back

## 2018-09-12 NOTE — Progress Notes (Signed)
Patient stated she was unaware of scheduled visit on 09/12/2018 and rescheduled.

## 2018-09-13 ENCOUNTER — Encounter: Payer: Self-pay | Admitting: Family Medicine

## 2018-09-13 ENCOUNTER — Telehealth: Payer: Self-pay

## 2018-09-13 NOTE — Telephone Encounter (Signed)
LM for patient that Joylene John, NP needs to discuss her surgery with Dr. Skeet Latch tomorrow when she is in clinic.She will reach out to you in the next day or two about the pre op appointment schedule.

## 2018-09-13 NOTE — Telephone Encounter (Signed)

## 2018-09-14 ENCOUNTER — Encounter: Payer: Self-pay | Admitting: Family Medicine

## 2018-09-14 ENCOUNTER — Telehealth: Payer: Self-pay | Admitting: Oncology

## 2018-09-14 ENCOUNTER — Ambulatory Visit (INDEPENDENT_AMBULATORY_CARE_PROVIDER_SITE_OTHER): Payer: Medicare HMO | Admitting: Family Medicine

## 2018-09-14 VITALS — BP 136/70 | HR 84 | Ht 64.0 in | Wt 172.0 lb

## 2018-09-14 DIAGNOSIS — R9431 Abnormal electrocardiogram [ECG] [EKG]: Secondary | ICD-10-CM

## 2018-09-14 DIAGNOSIS — Z01818 Encounter for other preprocedural examination: Secondary | ICD-10-CM

## 2018-09-14 DIAGNOSIS — E7800 Pure hypercholesterolemia, unspecified: Secondary | ICD-10-CM | POA: Diagnosis not present

## 2018-09-14 DIAGNOSIS — E0865 Diabetes mellitus due to underlying condition with hyperglycemia: Secondary | ICD-10-CM

## 2018-09-14 DIAGNOSIS — Z23 Encounter for immunization: Secondary | ICD-10-CM

## 2018-09-14 DIAGNOSIS — I10 Essential (primary) hypertension: Secondary | ICD-10-CM | POA: Diagnosis not present

## 2018-09-14 LAB — COMPREHENSIVE METABOLIC PANEL
ALT: 19 U/L (ref 0–35)
AST: 19 U/L (ref 0–37)
Albumin: 4.2 g/dL (ref 3.5–5.2)
Alkaline Phosphatase: 124 U/L — ABNORMAL HIGH (ref 39–117)
BUN: 9 mg/dL (ref 6–23)
CO2: 27 mEq/L (ref 19–32)
Calcium: 9.3 mg/dL (ref 8.4–10.5)
Chloride: 103 mEq/L (ref 96–112)
Creatinine, Ser: 0.78 mg/dL (ref 0.40–1.20)
GFR: 89.65 mL/min (ref >60.00)
Glucose, Bld: 105 mg/dL — ABNORMAL HIGH (ref 70–99)
Potassium: 4.4 mEq/L (ref 3.5–5.1)
Sodium: 136 mEq/L (ref 135–145)
Total Bilirubin: 0.4 mg/dL (ref 0.2–1.2)
Total Protein: 7 g/dL (ref 6.0–8.3)

## 2018-09-14 LAB — CBC
HCT: 41.3 % (ref 36.0–46.0)
Hemoglobin: 13.7 g/dL (ref 12.0–15.0)
MCHC: 33.3 g/dL (ref 30.0–36.0)
MCV: 80.2 fl (ref 78.0–100.0)
Platelets: 247 10*3/uL (ref 150.0–400.0)
RBC: 5.14 Mil/uL — ABNORMAL HIGH (ref 3.87–5.11)
RDW: 13.7 % (ref 11.5–15.5)
WBC: 9.6 10*3/uL (ref 4.0–10.5)

## 2018-09-14 LAB — LIPID PANEL
Cholesterol: 195 mg/dL (ref 0–200)
HDL: 62.1 mg/dL (ref >39.00)
LDL Cholesterol: 100 mg/dL — ABNORMAL HIGH (ref 0–99)
NonHDL: 133.31
Total CHOL/HDL Ratio: 3
Triglycerides: 168 mg/dL — ABNORMAL HIGH (ref 0.0–149.0)
VLDL: 33.6 mg/dL (ref 0.0–40.0)

## 2018-09-14 LAB — LDL CHOLESTEROL, DIRECT: Direct LDL: 122 mg/dL

## 2018-09-14 LAB — HEMOGLOBIN A1C: Hgb A1c MFr Bld: 8.7 % — ABNORMAL HIGH (ref 4.6–6.5)
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
## 2018-09-14 NOTE — Telephone Encounter (Signed)
Wendy [name] with appointment to see Dr. Meda Coffee with Cardiology on 09/22/18 at 9 am at the [facility] location.  She verbalized understanding and agreement.

## 2018-09-15 MED ORDER — EMPAGLIFLOZIN 10 MG PO TABS: 10.0000 mg | ORAL_TABLET | Freq: Every day | ORAL | 2 refills | Status: DC

## 2018-09-15 NOTE — Addendum Note (Signed)
Addended by: Jon Billings on: 09/15/2018 08:42 AM   Modules accepted: Orders

## 2018-09-20 ENCOUNTER — Encounter (HOSPITAL_COMMUNITY)
Admission: RE | Admit: 2018-09-20 | Discharge: 2018-09-20 | Disposition: A | Discharge status: Home / Self Care | Payer: Medicare HMO | Source: Ambulatory Visit | Attending: Gynecologic Oncology | Admitting: Gynecologic Oncology

## 2018-09-20 ENCOUNTER — Telehealth: Payer: Self-pay

## 2018-09-20 ENCOUNTER — Encounter (HOSPITAL_COMMUNITY): Payer: Self-pay

## 2018-09-20 ENCOUNTER — Other Ambulatory Visit: Payer: Self-pay

## 2018-09-20 ENCOUNTER — Telehealth: Payer: Self-pay | Admitting: *Deleted

## 2018-09-20 DIAGNOSIS — I10 Essential (primary) hypertension: Secondary | ICD-10-CM | POA: Diagnosis not present

## 2018-09-20 DIAGNOSIS — Z01812 Encounter for preprocedural laboratory examination: Secondary | ICD-10-CM | POA: Insufficient documentation

## 2018-09-20 DIAGNOSIS — Z79899 Other long term (current) drug therapy: Secondary | ICD-10-CM | POA: Insufficient documentation

## 2018-09-20 DIAGNOSIS — Z87891 Personal history of nicotine dependence: Secondary | ICD-10-CM | POA: Diagnosis not present

## 2018-09-20 DIAGNOSIS — R1907 Generalized intra-abdominal and pelvic swelling, mass and lump: Secondary | ICD-10-CM | POA: Insufficient documentation

## 2018-09-20 DIAGNOSIS — Z7984 Long term (current) use of oral hypoglycemic drugs: Secondary | ICD-10-CM | POA: Diagnosis not present

## 2018-09-20 DIAGNOSIS — E785 Hyperlipidemia, unspecified: Secondary | ICD-10-CM | POA: Diagnosis not present

## 2018-09-20 DIAGNOSIS — Z7982 Long term (current) use of aspirin: Secondary | ICD-10-CM | POA: Insufficient documentation

## 2018-09-20 DIAGNOSIS — Z8673 Personal history of transient ischemic attack (TIA), and cerebral infarction without residual deficits: Secondary | ICD-10-CM | POA: Insufficient documentation

## 2018-09-20 DIAGNOSIS — E119 Type 2 diabetes mellitus without complications: Secondary | ICD-10-CM | POA: Insufficient documentation

## 2018-09-20 HISTORY — DX: Other specified postprocedural states: Z98.890

## 2018-09-20 LAB — CBC
HCT: 44.7 % (ref 36.0–46.0)
Hemoglobin: 14.8 g/dL (ref 12.0–15.0)
MCH: 27.2 pg (ref 26.0–34.0)
MCHC: 33.1 g/dL (ref 30.0–36.0)
MCV: 82 fL (ref 80.0–100.0)
Platelets: 242 10*3/uL (ref 150–400)
RBC: 5.45 MIL/uL — ABNORMAL HIGH (ref 3.87–5.11)
RDW: 12.8 % (ref 11.5–15.5)
WBC: 8 10*3/uL (ref 4.0–10.5)
nRBC: 0 % (ref 0.0–0.2)

## 2018-09-20 LAB — URINALYSIS, ROUTINE W REFLEX MICROSCOPIC
Bilirubin Urine: NEGATIVE
Glucose, UA: NEGATIVE mg/dL
Hgb urine dipstick: NEGATIVE
Ketones, ur: NEGATIVE mg/dL
Nitrite: POSITIVE — AB
Protein, ur: NEGATIVE mg/dL
Specific Gravity, Urine: 1.023 (ref 1.005–1.030)
WBC, UA: >50 WBC/hpf — ABNORMAL HIGH (ref 0–5)
pH: 5 (ref 5.0–8.0)

## 2018-09-20 LAB — COMPREHENSIVE METABOLIC PANEL
ALT: 31 U/L (ref 0–44)
AST: 25 U/L (ref 15–41)
Albumin: 4.4 g/dL (ref 3.5–5.0)
Alkaline Phosphatase: 131 U/L — ABNORMAL HIGH (ref 38–126)
Anion gap: 11 (ref 5–15)
BUN: 11 mg/dL (ref 8–23)
CO2: 26 mmol/L (ref 22–32)
Calcium: 9.5 mg/dL (ref 8.9–10.3)
Chloride: 102 mmol/L (ref 98–111)
Creatinine, Ser: 0.79 mg/dL (ref 0.44–1.00)
GFR calc Af Amer: >60 mL/min (ref >60)
GFR calc non Af Amer: >60 mL/min (ref >60)
Glucose, Bld: 103 mg/dL — ABNORMAL HIGH (ref 70–99)
Potassium: 4.8 mmol/L (ref 3.5–5.1)
Sodium: 139 mmol/L (ref 135–145)
Total Bilirubin: 0.5 mg/dL (ref 0.3–1.2)
Total Protein: 7.7 g/dL (ref 6.5–8.1)

## 2018-09-20 LAB — GLUCOSE, CAPILLARY: Glucose-Capillary: 120 mg/dL — ABNORMAL HIGH (ref 70–99)

## 2018-09-20 NOTE — Patient Instructions (Signed)
DUE TO COVID-19 ONLY ONE VISITOR IS ALLOWED TO COME WITH YOU AND STAY IN THE WAITING ROOM ONLY DURING PRE OP AND PROCEDURE DAY OF SURGERY. THE 1 VISITOR MAY VISIT WITH YOU AFTER SURGERY IN YOUR PRIVATE ROOM DURING VISITING HOURS ONLY!  YOU NEED TO HAVE A COVID 19 TEST ON__Monday 09/14/2020_____ @__1150  am_____, THIS TEST MUST BE DONE BEFORE SURGERY, COME  [address], [place] , [id number].  ([place]) ONCE YOUR COVID TEST IS COMPLETED, PLEASE BEGIN THE QUARANTINE INSTRUCTIONS AS OUTLINED IN YOUR HANDOUT.                Lanadia Essen  09/20/2018   Your procedure is scheduled on: Thursday 09/28/2018   Report to [facility] Main  Entrance              Report to Short Stay at  [place]  AM     Call this number if you have problems the morning of surgery [phone]   Eat a light diet the day before surgery.  Examples including soups, broths, toast, yogurt, mashed potatoes.  Things to avoid include carbonated beverages (fizzy beverages), raw fruits and raw vegetables, or beans.   If your bowels are filled with gas, your surgeon will have difficulty visualizing your pelvic organs which increases your surgical risks.   CLEAR LIQUID DIET   Foods Allowed                                                                     Foods Excluded  Coffee and tea, regular and decaf                             liquids that you cannot  Plain Jell-O any favor except red or purple                                           see through such as: Fruit ices (not with fruit pulp)                                     milk, soups, orange juice  Iced Popsicles                                    All solid food Carbonated beverages, regular and diet                                    Cranberry, grape and apple juices Sports drinks like Gatorade Lightly seasoned clear broth or consume(fat free) Sugar, honey syrup  Sample Menu Breakfast                                Lunch  Supper Cranberry juice                    Beef broth                            Chicken broth Jell-O                                     Grape juice                           Apple juice Coffee or tea                        Jell-O                                      Popsicle                                                Coffee or tea                        Coffee or tea  _____________________________________________________________________    Remember: Do not eat food or drink liquids :After Midnight  How to Manage Your Diabetes Before and After Surgery  Why is it important to control my blood sugar before and after surgery? . Improving blood sugar levels before and after surgery helps healing and can limit problems. . A way of improving blood sugar control is eating a healthy diet by: o  Eating less sugar and carbohydrates o  Increasing activity/exercise o  Talking with your doctor about reaching your blood sugar goals . High blood sugars (greater than 180 mg/dL) can raise your risk of infections and slow your recovery, so you will need to focus on controlling your diabetes during the weeks before surgery. . Make sure that the doctor who takes care of your diabetes knows about your planned surgery including the date and location.  How do I manage my blood sugar before surgery? . Check your blood sugar at least 4 times a day, starting 2 days before surgery, to make sure that the level is not too high or low. o Check your blood sugar the morning of your surgery when you wake up and every 2 hours until you get to the Short Stay unit. . If your blood sugar is less than 70 mg/dL, you will need to treat for low blood sugar: o Do not take insulin. o Treat a low blood sugar (less than 70 mg/dL) with  cup of clear juice (cranberry or apple), 4 glucose tablets, OR glucose gel. o Recheck blood sugar in 15 minutes after treatment (to make sure it is greater than 70 mg/dL). If  your blood sugar is not greater than 70 mg/dL on recheck, call [phone] for further instructions. . Report your blood sugar to the short stay nurse when you get to Short Stay.  . If you are admitted to the hospital after surgery: o Your blood sugar will be checked by the staff and you will probably be given insulin  after surgery (instead of oral diabetes medicines) to make sure you have good blood sugar levels. o The goal for blood sugar control after surgery is 80-180 mg/dL.   WHAT DO I DO ABOUT MY DIABETES MEDICATION?         The day before surgery, Please DO NOT [place] !        The day before surgery, Take Metformin as usual.         . Do not take oral diabetes medicines (pills) the morning of surgery.               BRUSH YOUR TEETH MORNING OF SURGERY AND RINSE YOUR MOUTH OUT, NO CHEWING GUM CANDY OR MINTS.     Take these medicines the morning of surgery with A SIP OF WATER: Amlodipine (Norvasc), Levetiracetam (Keppra), Rosuvastation (Crestor)              DO NOT TAKE ANY DIABETIC MEDICATIONS DAY OF YOUR SURGERY!                               You may not have any metal on your body including hair pins and              piercings  Do not wear jewelry, make-up, lotions, powders or perfumes, deodorant             Do not wear nail polish.  Do not shave  48 hours prior to surgery.               Do not bring valuables to the hospital. [place].  Contacts, dentures or bridgework may not be worn into surgery.  Leave suitcase in the car. After surgery it may be brought to your room.     Patients discharged the day of surgery will not be allowed to drive home. IF YOU ARE HAVING SURGERY AND GOING HOME THE SAME DAY, YOU MUST HAVE AN ADULT TO DRIVE YOU HOME AND BE WITH YOU FOR 24 HOURS. YOU MAY GO HOME BY TAXI OR UBER OR ORTHERWISE, BUT AN ADULT MUST ACCOMPANY YOU HOME AND STAY WITH YOU FOR 24 HOURS.  Name and phone number of your  driver: friend Lattie [phone]                Please read over the following fact sheets you were given: _____________________________________________________________________  [facility] - Preparing for Surgery Before surgery, you can play an important role.  Because skin is not sterile, your skin needs to be as free of germs as possible.  You can reduce the number of germs on your skin by washing with CHG (chlorahexidine gluconate) soap before surgery.  CHG is an antiseptic cleaner which kills germs and bonds with the skin to continue killing germs even after washing. Please DO NOT use if you have an allergy to CHG or antibacterial soaps.  If your skin becomes reddened/irritated stop using the CHG and inform your nurse when you arrive at Short Stay. Do not shave (including legs and underarms) for at least 48 hours prior to the first CHG shower.  You may shave your face/neck. Please follow these instructions carefully:  1.  Shower with CHG Soap the night before surgery and the  morning of Surgery.  2.  If you choose to wash your hair, wash  your hair first as usual with your  normal  shampoo.  3.  After you shampoo, rinse your hair and body thoroughly to remove the  shampoo.                           4.  Use CHG as you would any other liquid soap.  You can apply chg directly  to the skin and wash                       Gently with a scrungie or clean washcloth.  5.  Apply the CHG Soap to your body ONLY FROM THE NECK DOWN.   Do not use on face/ open                           Wound or open sores. Avoid contact with eyes, ears mouth and genitals (private parts).                       Wash face,  Genitals (private parts) with your normal soap.             6.  Wash thoroughly, paying special attention to the area where your surgery  will be performed.  7.  Thoroughly rinse your body with warm water from the neck down.  8.  DO NOT shower/wash with your normal soap after using and rinsing off  the CHG  Soap.                9.  Pat yourself dry with a clean towel.            10.  Wear clean pajamas.            11.  Place clean sheets on your bed the night of your first shower and do not  sleep with pets. Day of Surgery : Do not apply any lotions/deodorants the morning of surgery.  Please wear clean clothes to the hospital/surgery center.  FAILURE TO FOLLOW THESE INSTRUCTIONS MAY RESULT IN THE CANCELLATION OF YOUR SURGERY PATIENT SIGNATURE_________________________________  NURSE SIGNATURE__________________________________  ________________________________________________________________________    Adam Phenix  An incentive spirometer is a tool that can help keep your lungs clear and active. This tool measures how well you are filling your lungs with each breath. Taking long deep breaths may help reverse or decrease the chance of developing breathing (pulmonary) problems (especially infection) following:  A long period of time when you are unable to move or be active. BEFORE THE PROCEDURE   If the spirometer includes an indicator to show your best effort, your nurse or respiratory therapist will set it to a desired goal.  If possible, sit up straight or lean slightly forward. Try not to slouch.  Hold the incentive spirometer in an upright position. INSTRUCTIONS FOR USE  1. Sit on the edge of your bed if possible, or sit up as far as you can in bed or on a chair. 2. Hold the incentive spirometer in an upright position. 3. Breathe out normally. 4. Place the mouthpiece in your mouth and seal your lips tightly around it. 5. Breathe in slowly and as deeply as possible, raising the piston or the ball toward the top of the column. 6. Hold your breath for 3-5 seconds or for as long as possible. Allow the piston or ball to fall to the bottom of the column.  7. Remove the mouthpiece from your mouth and breathe out normally. 8. Rest for a few seconds and repeat Steps 1 through 7 at  least 10 times every 1-2 hours when you are awake. Take your time and take a few normal breaths between deep breaths. 9. The spirometer may include an indicator to show your best effort. Use the indicator as a goal to work toward during each repetition. 10. After each set of 10 deep breaths, practice coughing to be sure your lungs are clear. If you have an incision (the cut made at the time of surgery), support your incision when coughing by placing a pillow or rolled up towels firmly against it. Once you are able to get out of bed, walk around indoors and cough well. You may stop using the incentive spirometer when instructed by your caregiver.  RISKS AND COMPLICATIONS  Take your time so you do not get dizzy or light-headed.  If you are in pain, you may need to take or ask for pain medication before doing incentive spirometry. It is harder to take a deep breath if you are having pain. AFTER USE  Rest and breathe slowly and easily.  It can be helpful to keep track of a log of your progress. Your caregiver can provide you with a simple table to help with this. If you are using the spirometer at home, follow these instructions: [place] IF:   You are having difficultly using the spirometer.  You have trouble using the spirometer as often as instructed.  Your pain medication is not giving enough relief while using the spirometer.  You develop fever of 100.5 F (38.1 C) or higher. SEEK IMMEDIATE MEDICAL CARE IF:   You cough up bloody sputum that had not been present before.  You develop fever of 102 F (38.9 C) or greater.  You develop worsening pain at or near the incision site. MAKE SURE YOU:   Understand these instructions.  Will watch your condition.  Will get help right away if you are not doing well or get worse. Document Released: 05/10/2006 Document Revised: 03/22/2011 Document Reviewed: 07/11/2006 Exit[employer] Patient Information 2014 ExitCare,  [place].   ________________________________________________________________________            WHAT IS A BLOOD TRANSFUSION? Blood Transfusion Information  A transfusion is the replacement of blood or some of its parts. Blood is made up of multiple cells which provide different functions.  Red blood cells carry oxygen and are used for blood loss replacement.  White blood cells fight against infection.  Platelets control bleeding.  Plasma helps clot blood.  Other blood products are available for specialized needs, such as hemophilia or other clotting disorders. BEFORE THE TRANSFUSION  Who gives blood for transfusions?   Healthy volunteers who are fully evaluated to make sure their blood is safe. This is blood bank blood. Transfusion therapy is the safest it has ever been in the practice of medicine. Before blood is taken from a donor, a complete history is taken to make sure that person has no history of diseases nor engages in risky social behavior (examples are intravenous drug use or sexual activity with multiple partners). The donor's travel history is screened to minimize risk of transmitting infections, such as malaria. The donated blood is tested for signs of infectious diseases, such as HIV and hepatitis. The blood is then tested to be sure it is compatible with you in order to minimize the chance of a transfusion reaction. If you  or a relative donates blood, this is often done in anticipation of surgery and is not appropriate for emergency situations. It takes many days to process the donated blood. RISKS AND COMPLICATIONS Although transfusion therapy is very safe and saves many lives, the main dangers of transfusion include:   Getting an infectious disease.  Developing a transfusion reaction. This is an allergic reaction to something in the blood you were given. Every precaution is taken to prevent this. The decision to have a blood transfusion has been considered carefully by your  caregiver before blood is given. Blood is not given unless the benefits outweigh the risks. AFTER THE TRANSFUSION  Right after receiving a blood transfusion, you will usually feel much better and more energetic. This is especially true if your red blood cells have gotten low (anemic). The transfusion raises the level of the red blood cells which carry oxygen, and this usually causes an energy increase.  The nurse administering the transfusion will monitor you carefully for complications. HOME CARE INSTRUCTIONS  No special instructions are needed after a transfusion. You may find your energy is better. Speak with your caregiver about any limitations on activity for underlying diseases you may have. SEEK MEDICAL CARE IF:   Your condition is not improving after your transfusion.  You develop redness or irritation at the intravenous (IV) site. SEEK IMMEDIATE MEDICAL CARE IF:  Any of the following symptoms occur over the next 12 hours:  Shaking chills.  You have a temperature by mouth above 102 F (38.9 C), not controlled by medicine.  Chest, back, or muscle pain.  People around you feel you are not acting correctly or are confused.  Shortness of breath or difficulty breathing.  Dizziness and fainting.  You get a rash or develop hives.  You have a decrease in urine output.  Your urine turns a dark color or changes to pink, red, or brown. Any of the following symptoms occur over the next 10 days:  You have a temperature by mouth above 102 F (38.9 C), not controlled by medicine.  Shortness of breath.  Weakness after normal activity.  The white part of the eye turns yellow (jaundice).  You have a decrease in the amount of urine or are urinating less often.  Your urine turns a dark color or changes to pink, red, or brown. Document Released: 12/26/1999 Document Revised: 03/22/2011 Document Reviewed: 08/14/2007 [facility] Patient Information 2014 [place],  [place].  _______________________________________________________________________

## 2018-09-20 NOTE — Telephone Encounter (Signed)
Received call from pt's pre-op appointment. Spoke with Dr. Zigmund Daniel about pt's Aspirin since Dr. Ethelene Hal is out of office. Pt is to hold Aspirin for 1 week prior to surgery. Information given to [facility], RN at the pre-op appointment.

## 2018-09-20 NOTE — Telephone Encounter (Signed)
Return the patient's call regarding cardiology. Gave the information with the doctor name and address.

## 2018-09-20 NOTE — Progress Notes (Addendum)
PCP - Dr. Alcide Clever [facility]  Cardiologist - has an appointment on Friday 09/22/2018 with Dr. Ena Dawley for Cardiac Clearance due to abnormal EKG from PCP office done on 09/14/2018  Patient has [place]. Last device check-09/03/2018  Chest x-ray -N/A  EKG - 09/14/2018 Stress Test - N/A ECHO -N/A  Cardiac Cath - N/A  Sleep Study - N/A CPAP -   Fasting Blood Sugar - 120 Checks Blood Sugar __0___ times a day. Patient states that she does not check her blood sugars. Patient was prescribed Jardiance to start taking from office visit on 09/14/2018. Patient states she did not know she had a new prescription ordered for her diabetes. Patient talked to Nathanial Millman, [place] at Dr. Bebe Shaggy office  via phone and Judson Roch gave patient instructions about stopping her Aspirin one week from surgery and that Dr. Ethelene Hal wanted her to start taking Jardiance for her diabetes. Patient verbalized understanding of the instructions from Nathanial Millman, [place].  Blood Thinner Instructions: Aspirin Instructions: Spoke to Nathanial Millman, CMA for Dr. Ethelene Hal but Dr. Zigmund Daniel was there today and informed Sarah to inform the patient that she needs to stop Aspirin one week prior to surgery. Patient talked to Nathanial Millman, CMA over the phone and verbalized understanding . Last Dose: Wednesday 09/20/2018  Anesthesia review:   Chart given to Iver Nestle, PA for review  Patient denies shortness of breath, fever, cough and chest pain at PAT appointment   Patient verbalized understanding of instructions that were given to them at the PAT appointment. Patient was also instructed that they will need to review over the PAT instructions again at home before surgery.

## 2018-09-21 ENCOUNTER — Telehealth: Payer: Self-pay

## 2018-09-21 NOTE — Telephone Encounter (Signed)
Told Wendy [name] that she needed to take her Asprin daily prior to surgery. Last dose would be 09-26-18  Per Dr. Skeet Latch. Told her that her Hgb A1c was 8.7 on 9.3. She will pick up the jardiance today and start it today and take as prescribed. Wendy [name] stated that she had the cardiologist appointment tomorrow at 0900 with Dr. Meda Coffee on [facility]. Pt verbalized understanding. She was able to repeat back to this nurse the above instructions.

## 2018-09-22 ENCOUNTER — Telehealth: Payer: Self-pay | Admitting: Oncology

## 2018-09-22 ENCOUNTER — Ambulatory Visit (INDEPENDENT_AMBULATORY_CARE_PROVIDER_SITE_OTHER): Payer: Medicare HMO | Admitting: Cardiology

## 2018-09-22 ENCOUNTER — Telehealth: Payer: Self-pay | Admitting: Family Medicine

## 2018-09-22 ENCOUNTER — Telehealth: Payer: Self-pay | Admitting: Cardiology

## 2018-09-22 ENCOUNTER — Other Ambulatory Visit: Payer: Self-pay

## 2018-09-22 ENCOUNTER — Encounter: Payer: Self-pay | Admitting: Cardiology

## 2018-09-22 VITALS — BP 120/66 | HR 77 | Ht 64.0 in | Wt 173.8 lb

## 2018-09-22 DIAGNOSIS — Z8673 Personal history of transient ischemic attack (TIA), and cerebral infarction without residual deficits: Secondary | ICD-10-CM

## 2018-09-22 DIAGNOSIS — I10 Essential (primary) hypertension: Secondary | ICD-10-CM | POA: Diagnosis not present

## 2018-09-22 DIAGNOSIS — E119 Type 2 diabetes mellitus without complications: Secondary | ICD-10-CM

## 2018-09-22 DIAGNOSIS — Z01810 Encounter for preprocedural cardiovascular examination: Secondary | ICD-10-CM

## 2018-09-22 DIAGNOSIS — E7849 Other hyperlipidemia: Secondary | ICD-10-CM | POA: Diagnosis not present

## 2018-09-22 DIAGNOSIS — E0865 Diabetes mellitus due to underlying condition with hyperglycemia: Secondary | ICD-10-CM

## 2018-09-22 NOTE — Telephone Encounter (Signed)
Copied from [place] [id number]. Topic: General - Other >> Sep 22, 2018  1:28 PM Keene Breath wrote: Reason for CRM: Called to get surgical clearance for the patient's upcoming surgery on 09/28/18.  Please call to discuss.  CB# [phone]

## 2018-09-22 NOTE — Telephone Encounter (Signed)
Left message with [facility] for them to update note if pt is cleared. Will notify surgery center once we know.

## 2018-09-22 NOTE — Telephone Encounter (Signed)
Left a message for Dr. Bebe Shaggy nurse regarding surgical clearance.  Requested a return call.

## 2018-09-22 NOTE — Telephone Encounter (Signed)
   Office note from today's visit with Dr. Meda Coffee to address preoperative assessment was faxed to Dr. Bebe Shaggy office.  Abigail Butts, PA-C 09/22/18; 5:42 PM

## 2018-09-22 NOTE — Telephone Encounter (Signed)
I just did, she did not mention preop eval at all, she was a new patient for me, thank you for letting me know

## 2018-09-22 NOTE — Telephone Encounter (Signed)
New message     Pt was seen today by Dr Meda Coffee.  Calling to see if patient was cleared for surgery.  Please put note in epic.
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
## 2018-09-22 NOTE — Telephone Encounter (Signed)
Can you review the cardiology note and let me know if she's cleared?

## 2018-09-22 NOTE — Patient Instructions (Signed)
Your physician has recommended you make the following change in your medication: INCREASE ROSUVASTATIN TO 40 MG FUROSEMIDE AS NEEDED  Your physician wants you to follow-up in: [place] will receive a reminder letter in the mail two months in advance. If you don't receive a letter, please call our office to schedule the follow-up appointment.

## 2018-09-23 LAB — URINE CULTURE: Culture: >=100000 — AB

## 2018-09-25 ENCOUNTER — Other Ambulatory Visit (HOSPITAL_COMMUNITY)
Admission: RE | Admit: 2018-09-25 | Discharge: 2018-09-25 | Disposition: A | Discharge status: Home / Self Care | Payer: Medicare HMO | Source: Ambulatory Visit | Attending: Gynecologic Oncology | Admitting: Gynecologic Oncology

## 2018-09-25 ENCOUNTER — Other Ambulatory Visit: Payer: Self-pay | Admitting: Gynecologic Oncology

## 2018-09-25 DIAGNOSIS — Z01812 Encounter for preprocedural laboratory examination: Secondary | ICD-10-CM | POA: Insufficient documentation

## 2018-09-25 DIAGNOSIS — Z20828 Contact with and (suspected) exposure to other viral communicable diseases: Secondary | ICD-10-CM | POA: Insufficient documentation

## 2018-09-25 DIAGNOSIS — N3000 Acute cystitis without hematuria: Secondary | ICD-10-CM

## 2018-09-25 MED ORDER — SULFAMETHOXAZOLE-TRIMETHOPRIM 800-160 MG PO TABS: 1.0000 | ORAL_TABLET | Freq: Two times a day (BID) | ORAL | 0 refills | Status: DC

## 2018-09-25 NOTE — Telephone Encounter (Signed)
Told Wendy [name] the information noted below regarding the UTI and prescription.
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
## 2018-09-25 NOTE — Progress Notes (Signed)
Plan for treatment of UTI prior to surgery.

## 2018-09-25 NOTE — Anesthesia Preprocedure Evaluation (Addendum)
Anesthesia Evaluation  Patient identified by MRN, date of birth, ID band Patient awake    Reviewed: Allergy & Precautions, NPO status , Patient's Chart, lab work & pertinent test results  Airway Mallampati: III  TM Distance: >3 FB     Dental  (+) Dental Advisory Given   Pulmonary asthma , former smoker,    breath sounds clear to auscultation       Cardiovascular hypertension, Pt. on medications  Rhythm:Regular Rate:Normal     Neuro/Psych Seizures -,  CVA    GI/Hepatic negative GI ROS, Neg liver ROS,   Endo/Other  diabetes, Type 2, Oral Hypoglycemic Agents  Renal/GU negative Renal ROS     Musculoskeletal   Abdominal   Peds  Hematology negative hematology ROS (+)   Anesthesia Other Findings   Reproductive/Obstetrics                           Lab Results  Component Value Date   WBC 8.0 09/20/2018   HGB 14.8 09/20/2018   HCT 44.7 09/20/2018   MCV 82.0 09/20/2018   PLT 242 09/20/2018   Lab Results  Component Value Date   CREATININE 0.79 09/20/2018   BUN 11 09/20/2018   NA 139 09/20/2018   K 4.8 09/20/2018   CL 102 09/20/2018   CO2 26 09/20/2018   Echo 09/09/15 Interpretation Le left ventricle is normal in size LV ejection Fraction = 60-70% There is no Doppler evidence for an atrial septal defect.  Injection of contrast documented no interatrial shunt The LA appendage peak emptying velopcity is normal.  No thrombus is detected in the left atrial appendage.  There is Grade 2 atherosclerotic plaque in the ascending aorta (43mm calcified protrusion at the sinotubular junction) There is Grade 2 athersclerotic plaque(s) in the descending aorta.  Anesthesia Physical Anesthesia Plan  ASA: III  Anesthesia Plan: General   Post-op Pain Management:    Induction: Intravenous  PONV Risk Score and Plan: 3 and Dexamethasone, Ondansetron and Treatment may vary due to age or medical  condition  Airway Management Planned: Oral ETT  Additional Equipment:   Intra-op Plan:   Post-operative Plan: Extubation in OR  Informed Consent: I have reviewed the patients History and Physical, chart, labs and discussed the procedure including the risks, benefits and alternatives for the proposed anesthesia with the patient or authorized representative who has indicated his/her understanding and acceptance.     Dental advisory given  Plan Discussed with: CRNA  Anesthesia Plan Comments:       Anesthesia Quick Evaluation

## 2018-09-25 NOTE — Telephone Encounter (Signed)
LM for Ms Jeff to call back to the office to discuss the bactrim sent in to her pharmacy for a UTI on her pre op labs 09-20-18. When she calls back will inform her that bactrim was sent in to her pharmacy .She needs to take tablet bid for 3 days per Joylene John, NP.

## 2018-09-26 LAB — NOVEL CORONAVIRUS, NAA (HOSP ORDER, SEND-OUT TO REF LAB; TAT 18-24 HRS): SARS-CoV-2, NAA: NOT DETECTED

## 2018-09-27 ENCOUNTER — Telehealth: Payer: Self-pay

## 2018-09-27 NOTE — Telephone Encounter (Signed)
Spoke with Ms Withers about surgery tomorrow. Reviewed pre op instructions from 09-20-18. Pt took Jardiance this morning as was to hold dose today. Told Joylene John, NP this information.  She will make the pre-op PA-C aware that pt took jardiance today. Pt is taking her metformin bid today and will not take tomorrow. Pt uses a pill box.  She will take the medications listed on pre op paper from the bottles as she is not sure of what is in the pill box for tomorrow. Pt verbalized understanding.

## 2018-09-28 ENCOUNTER — Ambulatory Visit (HOSPITAL_COMMUNITY): Payer: Medicare HMO

## 2018-09-28 ENCOUNTER — Encounter (HOSPITAL_COMMUNITY): Payer: Self-pay

## 2018-09-28 ENCOUNTER — Encounter (HOSPITAL_COMMUNITY)
Admission: RE | Disposition: A | Discharge status: Home / Self Care | Payer: Self-pay | Source: Home / Self Care | Attending: Gynecologic Oncology

## 2018-09-28 ENCOUNTER — Ambulatory Visit (HOSPITAL_COMMUNITY)
Admission: RE | Admit: 2018-09-28 | Discharge: 2018-09-28 | Disposition: A | Discharge status: Home / Self Care | Payer: Medicare HMO | Attending: Gynecologic Oncology | Admitting: Gynecologic Oncology

## 2018-09-28 ENCOUNTER — Ambulatory Visit: Payer: Medicare Other | Admitting: Physical Medicine & Rehabilitation

## 2018-09-28 DIAGNOSIS — I69352 Hemiplegia and hemiparesis following cerebral infarction affecting left dominant side: Secondary | ICD-10-CM | POA: Insufficient documentation

## 2018-09-28 DIAGNOSIS — E119 Type 2 diabetes mellitus without complications: Secondary | ICD-10-CM | POA: Diagnosis not present

## 2018-09-28 DIAGNOSIS — N838 Other noninflammatory disorders of ovary, fallopian tube and broad ligament: Secondary | ICD-10-CM | POA: Diagnosis not present

## 2018-09-28 DIAGNOSIS — J45909 Unspecified asthma, uncomplicated: Secondary | ICD-10-CM | POA: Diagnosis not present

## 2018-09-28 DIAGNOSIS — Z7982 Long term (current) use of aspirin: Secondary | ICD-10-CM | POA: Diagnosis not present

## 2018-09-28 DIAGNOSIS — Z7984 Long term (current) use of oral hypoglycemic drugs: Secondary | ICD-10-CM | POA: Diagnosis not present

## 2018-09-28 DIAGNOSIS — I10 Essential (primary) hypertension: Secondary | ICD-10-CM | POA: Insufficient documentation

## 2018-09-28 DIAGNOSIS — E785 Hyperlipidemia, unspecified: Secondary | ICD-10-CM | POA: Insufficient documentation

## 2018-09-28 DIAGNOSIS — R1900 Intra-abdominal and pelvic swelling, mass and lump, unspecified site: Secondary | ICD-10-CM | POA: Diagnosis not present

## 2018-09-28 DIAGNOSIS — N898 Other specified noninflammatory disorders of vagina: Secondary | ICD-10-CM | POA: Diagnosis not present

## 2018-09-28 DIAGNOSIS — Z79899 Other long term (current) drug therapy: Secondary | ICD-10-CM | POA: Insufficient documentation

## 2018-09-28 DIAGNOSIS — Z888 Allergy status to other drugs, medicaments and biological substances status: Secondary | ICD-10-CM | POA: Diagnosis not present

## 2018-09-28 DIAGNOSIS — Z885 Allergy status to narcotic agent status: Secondary | ICD-10-CM | POA: Insufficient documentation

## 2018-09-28 DIAGNOSIS — N736 Female pelvic peritoneal adhesions (postinfective): Secondary | ICD-10-CM | POA: Diagnosis not present

## 2018-09-28 DIAGNOSIS — N83202 Unspecified ovarian cyst, left side: Secondary | ICD-10-CM

## 2018-09-28 DIAGNOSIS — R569 Unspecified convulsions: Secondary | ICD-10-CM | POA: Diagnosis not present

## 2018-09-28 DIAGNOSIS — Z87891 Personal history of nicotine dependence: Secondary | ICD-10-CM | POA: Diagnosis not present

## 2018-09-28 HISTORY — PX: ROBOTIC ASSISTED SALPINGO OOPHERECTOMY: SHX6082

## 2018-09-28 LAB — TYPE AND SCREEN
ABO/RH(D): B POS
Antibody Screen: POSITIVE
PT AG Type: NEGATIVE
Unit division: 0
Unit division: 0

## 2018-09-28 LAB — GLUCOSE, CAPILLARY
Glucose-Capillary: 131 mg/dL — ABNORMAL HIGH (ref 70–99)
Glucose-Capillary: 168 mg/dL — ABNORMAL HIGH (ref 70–99)

## 2018-09-28 LAB — BPAM RBC
Blood Product Expiration Date: 202010062359
Blood Product Expiration Date: 202010082359
Unit Type and Rh: 7300
Unit Type and Rh: 7300

## 2018-09-28 SURGERY — SALPINGO-OOPHORECTOMY, ROBOT-ASSISTED
Anesthesia: General | Laterality: Bilateral

## 2018-09-28 MED ORDER — SODIUM CHLORIDE 0.9% FLUSH: 3.0000 mL | INTRAVENOUS | Status: DC | PRN

## 2018-09-28 MED ORDER — LACTATED RINGERS IV SOLN
INTRAVENOUS | Status: DC
  Administered 2018-09-28: 07:00:00 via INTRAVENOUS

## 2018-09-28 MED ORDER — TRAMADOL HCL 50 MG PO TABS: 50.0000 mg | ORAL_TABLET | Freq: Four times a day (QID) | ORAL | 0 refills | Status: DC | PRN

## 2018-09-28 MED ORDER — ROCURONIUM BROMIDE 10 MG/ML (PF) SYRINGE
PREFILLED_SYRINGE | INTRAVENOUS | Status: DC | PRN
  Administered 2018-09-28: 50 mg via INTRAVENOUS
  Administered 2018-09-28: 5 mg via INTRAVENOUS

## 2018-09-28 MED ORDER — ACETAMINOPHEN 650 MG RE SUPP: 650.0000 mg | RECTAL | Status: DC | PRN

## 2018-09-28 MED ORDER — GABAPENTIN 300 MG PO CAPS
300.0000 mg | ORAL_CAPSULE | ORAL | Status: AC
  Administered 2018-09-28: 300 mg via ORAL
  Filled 2018-09-28: qty 1

## 2018-09-28 MED ORDER — FENTANYL CITRATE (PF) 100 MCG/2ML IJ SOLN: 25.0000 ug | INTRAMUSCULAR | Status: DC | PRN

## 2018-09-28 MED ORDER — MIDAZOLAM HCL 2 MG/2ML IJ SOLN
INTRAMUSCULAR | Status: DC | PRN
  Administered 2018-09-28 (×2): 0.5 mg via INTRAVENOUS

## 2018-09-28 MED ORDER — DEXAMETHASONE SODIUM PHOSPHATE 4 MG/ML IJ SOLN: 4.0000 mg | INTRAMUSCULAR | Status: DC

## 2018-09-28 MED ORDER — SUGAMMADEX SODIUM 200 MG/2ML IV SOLN
INTRAVENOUS | Status: DC | PRN
  Administered 2018-09-28: 200 mg via INTRAVENOUS

## 2018-09-28 MED ORDER — TRAMADOL HCL 50 MG PO TABS: 100.0000 mg | ORAL_TABLET | Freq: Two times a day (BID) | ORAL | Status: DC | PRN

## 2018-09-28 MED ORDER — MIDAZOLAM HCL 2 MG/2ML IJ SOLN
INTRAMUSCULAR | Status: AC
  Filled 2018-09-28: qty 2

## 2018-09-28 MED ORDER — STERILE WATER FOR IRRIGATION IR SOLN
Status: DC | PRN
  Administered 2018-09-28: 1000 mL

## 2018-09-28 MED ORDER — ENOXAPARIN SODIUM 40 MG/0.4ML ~~LOC~~ SOLN
40.0000 mg | SUBCUTANEOUS | Status: AC
  Administered 2018-09-28: 40 mg via SUBCUTANEOUS
  Filled 2018-09-28: qty 0.4

## 2018-09-28 MED ORDER — SODIUM CHLORIDE 0.9 % IV SOLN: 250.0000 mL | INTRAVENOUS | Status: DC | PRN

## 2018-09-28 MED ORDER — ONDANSETRON HCL 4 MG/2ML IJ SOLN
INTRAMUSCULAR | Status: DC | PRN
  Administered 2018-09-28: 4 mg via INTRAVENOUS

## 2018-09-28 MED ORDER — ACETAMINOPHEN 500 MG PO TABS
1000.0000 mg | ORAL_TABLET | ORAL | Status: AC
  Administered 2018-09-28: 1000 mg via ORAL
  Filled 2018-09-28: qty 2

## 2018-09-28 MED ORDER — SODIUM CHLORIDE 0.9% FLUSH: 3.0000 mL | Freq: Two times a day (BID) | INTRAVENOUS | Status: DC

## 2018-09-28 MED ORDER — ONDANSETRON HCL 4 MG/2ML IJ SOLN: 4.0000 mg | Freq: Once | INTRAMUSCULAR | Status: DC | PRN

## 2018-09-28 MED ORDER — BUPIVACAINE HCL (PF) 0.25 % IJ SOLN
INTRAMUSCULAR | Status: DC | PRN
  Administered 2018-09-28: 5 mL

## 2018-09-28 MED ORDER — LACTATED RINGERS IR SOLN
Status: DC | PRN
  Administered 2018-09-28: 1000 mL

## 2018-09-28 MED ORDER — SCOPOLAMINE 1 MG/3DAYS TD PT72
1.0000 | MEDICATED_PATCH | TRANSDERMAL | Status: DC
  Administered 2018-09-28: 07:00:00 1.5 mg via TRANSDERMAL
  Filled 2018-09-28: qty 1

## 2018-09-28 MED ORDER — BUPIVACAINE HCL 0.25 % IJ SOLN
INTRAMUSCULAR | Status: AC
  Filled 2018-09-28: qty 1

## 2018-09-28 MED ORDER — DEXAMETHASONE SODIUM PHOSPHATE 10 MG/ML IJ SOLN
INTRAMUSCULAR | Status: DC | PRN
  Administered 2018-09-28: 4 mg via INTRAVENOUS

## 2018-09-28 MED ORDER — FENTANYL CITRATE (PF) 250 MCG/5ML IJ SOLN
INTRAMUSCULAR | Status: AC
  Filled 2018-09-28: qty 5

## 2018-09-28 MED ORDER — LIDOCAINE 2% (20 MG/ML) 5 ML SYRINGE
INTRAMUSCULAR | Status: DC | PRN
  Administered 2018-09-28: 40 mg via INTRAVENOUS
  Administered 2018-09-28: 60 mg via INTRAVENOUS

## 2018-09-28 MED ORDER — CEFAZOLIN SODIUM-DEXTROSE 2-4 GM/100ML-% IV SOLN
2.0000 g | INTRAVENOUS | Status: AC
  Administered 2018-09-28: 2 g via INTRAVENOUS
  Filled 2018-09-28: qty 100

## 2018-09-28 MED ORDER — PROPOFOL 10 MG/ML IV BOLUS
INTRAVENOUS | Status: AC
  Filled 2018-09-28: qty 20

## 2018-09-28 MED ORDER — ACETAMINOPHEN 325 MG PO TABS: 650.0000 mg | ORAL_TABLET | ORAL | Status: DC | PRN

## 2018-09-28 MED ORDER — FENTANYL CITRATE (PF) 250 MCG/5ML IJ SOLN
INTRAMUSCULAR | Status: DC | PRN
  Administered 2018-09-28: 50 ug via INTRAVENOUS
  Administered 2018-09-28: 25 ug via INTRAVENOUS
  Administered 2018-09-28: 50 ug via INTRAVENOUS
  Administered 2018-09-28 (×2): 25 ug via INTRAVENOUS

## 2018-09-28 MED ORDER — PROPOFOL 10 MG/ML IV BOLUS
INTRAVENOUS | Status: DC | PRN
  Administered 2018-09-28: 150 mg via INTRAVENOUS

## 2018-09-28 MED ORDER — LIDOCAINE 20MG/ML (2%) 15 ML SYRINGE OPTIME
INTRAMUSCULAR | Status: DC | PRN
  Administered 2018-09-28: 1 mg/kg/h via INTRAVENOUS

## 2018-09-28 MED ORDER — SODIUM CHLORIDE 0.9 % IV SOLN
INTRAVENOUS | Status: DC | PRN
  Administered 2018-09-28: 25 ug/min via INTRAVENOUS

## 2018-09-28 SURGICAL SUPPLY — 49 items
COVER BACK TABLE 60X90IN (DRAPES) ×1 IMPLANT
COVER TIP SHEARS 8 DVNC (MISCELLANEOUS) ×1 IMPLANT
COVER TIP SHEARS 8MM DA VINCI (MISCELLANEOUS) ×1
COVER WAND RF STERILE (DRAPES) IMPLANT
DECANTER SPIKE VIAL GLASS SM (MISCELLANEOUS) IMPLANT
DERMABOND ADVANCED (GAUZE/BANDAGES/DRESSINGS) ×1
DERMABOND ADVANCED .7 DNX12 (GAUZE/BANDAGES/DRESSINGS) IMPLANT
DRAPE ARM DVNC X/XI (DISPOSABLE) ×4 IMPLANT
DRAPE COLUMN DVNC XI (DISPOSABLE) ×1 IMPLANT
DRAPE DA VINCI XI ARM (DISPOSABLE) ×4
DRAPE DA VINCI XI COLUMN (DISPOSABLE) ×1
DRAPE SHEET LG 3/4 BI-LAMINATE (DRAPES) ×2 IMPLANT
DRAPE SURG IRRIG POUCH 19X23 (DRAPES) ×2 IMPLANT
ELECT REM PT RETURN 15FT ADLT (MISCELLANEOUS) ×2 IMPLANT
GLOVE BIO SURGEON STRL SZ 6.5 (GLOVE) ×4 IMPLANT
GLOVE BIO SURGEON STRL SZ7.5 (GLOVE) ×6 IMPLANT
GLOVE INDICATOR 8.0 STRL GRN (GLOVE) ×4 IMPLANT
GOWN STRL REUS W/ TWL LRG LVL3 (GOWN DISPOSABLE) ×1 IMPLANT
GOWN STRL REUS W/TWL LRG LVL3 (GOWN DISPOSABLE) ×1
GOWN STRL REUS W/TWL XL LVL3 (GOWN DISPOSABLE) ×4 IMPLANT
HOLDER FOLEY CATH W/STRAP (MISCELLANEOUS) ×2 IMPLANT
IRRIG SUCT STRYKERFLOW 2 WTIP (MISCELLANEOUS) ×2
IRRIGATION SUCT STRKRFLW 2 WTP (MISCELLANEOUS) ×1 IMPLANT
KIT PROCEDURE DA VINCI SI (MISCELLANEOUS)
KIT PROCEDURE DVNC SI (MISCELLANEOUS) IMPLANT
KIT TURNOVER KIT A (KITS) IMPLANT
MANIPULATOR UTERINE 4.5 ZUMI (MISCELLANEOUS) ×1 IMPLANT
NDL SPNL 18GX3.5 QUINCKE PK (NEEDLE) IMPLANT
NEEDLE HYPO 22GX1.5 SAFETY (NEEDLE) ×2 IMPLANT
NEEDLE SPNL 18GX3.5 QUINCKE PK (NEEDLE) IMPLANT
PACK ROBOT GYN CUSTOM WL (TRAY / TRAY PROCEDURE) ×2 IMPLANT
PAD POSITIONING PINK XL (MISCELLANEOUS) ×2 IMPLANT
PORT ACCESS TROCAR AIRSEAL 12 (TROCAR) ×1 IMPLANT
PORT ACCESS TROCAR AIRSEAL 5M (TROCAR) ×1
POUCH SPECIMEN RETRIEVAL 10MM (ENDOMECHANICALS) ×2 IMPLANT
SCISSORS ENDO CVD 5DCS (MISCELLANEOUS) ×1 IMPLANT
SEAL CANN UNIV 5-8 DVNC XI (MISCELLANEOUS) ×4 IMPLANT
SEAL XI 5MM-8MM UNIVERSAL (MISCELLANEOUS) ×4
SET TRI-LUMEN FLTR TB AIRSEAL (TUBING) ×2 IMPLANT
SUT VIC AB 0 CT1 27 (SUTURE)
SUT VIC AB 0 CT1 27XBRD ANTBC (SUTURE) IMPLANT
SUT VIC AB 4-0 PS2 27 (SUTURE) ×2 IMPLANT
SUT VLOC 180 0 9IN  GS21 (SUTURE)
SUT VLOC 180 0 9IN GS21 (SUTURE) IMPLANT
TOWEL OR NON WOVEN STRL DISP B (DISPOSABLE) ×2 IMPLANT
TRAP SPECIMEN MUCOUS 40CC (MISCELLANEOUS) ×1 IMPLANT
TRAY FOLEY MTR SLVR 16FR STAT (SET/KITS/TRAYS/PACK) ×2 IMPLANT
UNDERPAD 30X36 HEAVY ABSORB (UNDERPADS AND DIAPERS) ×2 IMPLANT
WATER STERILE IRR 1000ML POUR (IV SOLUTION) ×2 IMPLANT

## 2018-09-28 NOTE — Anesthesia Procedure Notes (Signed)
Procedure Name: Intubation Date/Time: 09/28/2018 7:40 AM Performed by: Cynda Familia, CRNA Pre-anesthesia Checklist: Patient identified, Emergency Drugs available, Suction available and Patient being monitored Patient Re-evaluated:Patient Re-evaluated prior to induction Oxygen Delivery Method: Circle System Utilized Preoxygenation: Pre-oxygenation with 100% oxygen Induction Type: IV induction Ventilation: Mask ventilation without difficulty Laryngoscope Size: Miller and 2 Grade View: Grade I Tube type: Oral Tube size: 7.0 mm Number of attempts: 1 Airway Equipment and Method: Stylet Placement Confirmation: ETT inserted through vocal cords under direct vision,  positive ETCO2 and breath sounds checked- equal and bilateral Secured at: 21 cm Tube secured with: Tape Dental Injury: Teeth and Oropharynx as per pre-operative assessment  Comments: Smooth IV induction Fitzgerald-- intubation AM CRNA atraumatic-- teeth and mouth as preop-- no teeth on top poor dentition on bottom-- bilat BS [employer]

## 2018-09-28 NOTE — Interval H&P Note (Signed)
History and Physical Interval Note:  09/28/2018 7:18 AM  Wendy [name]  has presented today for surgery, with the diagnosis of pelvic mass.  The various methods of treatment have been discussed with the patient and family. After consideration of risks, benefits and other options for treatment, the patient has consented to  Procedure(s): XI ROBOTIC ASSISTED BILATERAL SALPINGO OOPHORECTOMY, POSSIBLE TOTAL LAPAROSCOPIC HYSTERECTOMY, POSSIBLE LYMPH NODE DISSECTION, POSSIBLE OMENTECTOMY (Bilateral) as a surgical intervention.  The patient's history has been reviewed, patient examined, no change in status, stable for surgery.  I have reviewed the patient's chart and labs.  Questions were answered to the patient's satisfaction.     Janie Morning

## 2018-09-28 NOTE — Op Note (Addendum)
OPERATIVE NOTE  Preoperative Diagnosis: Right adnexal mass Postoperative Diagnosis: Left adnexal mass with evidence of prior torsion  Procedure(s) Performed: Robotic  Bilateral salpingo oophorectomy,   Surgeon: Francetta Found.  Skeet Latch, M.D. PhD  Assistant Surgeon:Lisa Delsa Sale MD.   Specimens: bilateral ovaries, tubes,pelvic washings  Estimated Blood Loss:, [id number]  Complications: None  Indication for Procedure: This is a 63 y.o. with an enlarging right adnexal mass with a vascular nodule.   Operative Findings:  92m uterus.10cm right adnexa with evidence of prior torsion adherent to the uterus bowel and sidewall.  Normal appearing right adnexa.  Normal-appearing appendix.  Normal-appearing omentum with distal adhesions to the umbilicus and the anterior uterus.  Normal-appearing diaphragm no evidence of intra-abdominal metastatic disease  Procedure: Patient was taken to the operating room and placed under general endotracheal anesthesia without any difficulty. She is placed in the dorsal lithotomy position and secured to the operative table over the chest with tape.   The patient was prepped and draped.  The appropriately sized Koh ring was circumferentially around the cervix. The balloon was placed within the vagina. An OG tube was present and functional. At an area on the left in line with the nipple approximately 2 cm below a 5 mm Optiview inserted under direct visualization. The abdomen was insufflated to 15 mm of mercury and the pressure never deviated above that throughout the remainder of the procedure. Maximum Trendelenburg positioning was obtained. At approximately 22 cm proximal to the symphysis pubis an incision was made just superior to the umbilicus. This area was infiltrated with lidocaine as well as the location 10 cm lateral to this incision and 2 cm superior to the left anterior superior iliac spine. Incisions were made. 8 mm trocar was inserted in the superior umbilicus incision. 8  millimeter robotic ports were placed in the 3 incisions. The left upper quadrant port site was replaced with a 12 mm port. This was all completed under direct visualization. The small and large bowel were could not be deflective because of the omental adhesions.  These adhesions were resected using sharp dissection with intermittent cautery.  The robot was docked and instruments placed.  The omental adhesions to the uterus were transected.  At that time it became evident that her right adnexal mass was in fact the left adnexal mass displaced into the right side of the pelvis.  This mass was resected from its adhesions to the rectosigmoid colon and the posterior uterus.  The retroperitoneum was entered lateral to the left infundibulopelvic vessels.  The ureter was identified. The left infundibulopelvic ligament was cauterized and transected The utero-ovarian ligament was transected.  The specimen was placed in an Endo Catch bag.  Similarly on the right the retro-peritoneum was entered just laterally to the infundibulopelvic ligament.  The retroperitoneal space was entered and the right ureter identified.  A window was made in the broad ligament and infundibulopelvic vessels were cauterized and transected.  The broad ligament was incised to the level of the uterine cornua.  The utero-ovarian ligament and the fallopian tube was cauterized and transected and specimen was placed in the bag.  The laparoscopic pouches were removed from the pelvic cavity.   Frozen section returned consistent with evidence of hemorrhagic changes in the left adnexa hemostasis was assured.  The pelvis was irrigated and drained the intra-abdominal pressure reduced to 8 mmHg without any evidence of bleeding.  The robot was undocked.  The laparoscopic ports were all removed and the sites of placement copiously  irrigated. The LUQ fascia was closed with an interrupted 0 Vicryl  suture.  Skin incisions were closed with a subcuticular suture.  Dermabond was placed over the incisions.  The vaginal vault was cleared with a moist sponge stick.  Sponge, lap and needle counts were correct x 3.    The patient had sequential compression devices and preoperative Lovenox for VTE prophylaxis and will receive Lovenox postoperatively.          Disposition: PACU - hemodynamically stable.         Condition:stable Foley draining clear urine.

## 2018-09-28 NOTE — Discharge Instructions (Signed)
Return to work: 1 - 2 weeks .  Activity: 1. Be up and out of the bed during the day.  Take a nap if needed.  You may walk up steps but be careful and use the hand rail.  Stair climbing will tire you more than you think, you may need to stop part way and rest.   2. No lifting or straining for 4 weeks.  3. No driving for 1 weeks.  Do Not drive if you are taking narcotic pain medicine.  4. Shower daily.  Use soap and water on your incision and pat dry; don't rub.   5. No sexual activity and nothing in the vagina for 4 weeks.  Medications:  - Take ibuprofen and tylenol first line for pain control. Take these regularly (every 6 hours) to decrease the build up of pain.  - If necessary, for severe pain not relieved by ibuprofen, contact Dr Leone Brand office and you will be prescribed percocet.  - While taking percocet you should take sennakot every night to reduce the likelihood of constipation. If this causes diarrhea, stop its use.  Diet: 1. Low sodium Heart Healthy Diet is recommended.  2. It is safe to use a laxative if you have difficulty moving your bowels.   Wound Care: 1. Keep clean and dry.  Shower daily.  Reasons to call the Doctor:   Fever - Oral temperature greater than 100.4 degrees Fahrenheit  Foul-smelling vaginal discharge  Difficulty urinating  Nausea and vomiting  Increased pain at the site of the incision that is unrelieved with pain medicine.  Difficulty breathing with or without chest pain  New calf pain especially if only on one side  Sudden, continuing increased vaginal bleeding with or without clots.   Follow-up: 1. See Everitt Amber in 4 weeks.  Contacts: For questions or concerns you should contact:  Dr. Janie Morning at [phone] After hours and on week-ends call [phone] and ask to speak to the physician on call for Gynecologic Oncology   Bilateral Salpingo-Oophorectomy, Care After This sheet gives you information about how to care  for yourself after your procedure. Your health care provider may also give you more specific instructions. If you have problems or questions, contact your health care provider. What can I expect after the procedure? After the procedure, it is common to have:  Abdominal pain.  Some occasional vaginal bleeding (spotting).  Tiredness.  Symptoms of menopause, such as hot flashes, night sweats, or mood swings. Follow these instructions at home: Incision care   Keep your incision area and your bandage (dressing) clean and dry.  Follow instructions from your health care provider about how to take care of your incision. Make sure you: ? Wash your hands with soap and water before you change your dressing. If soap and water are not available, use hand sanitizer. ? Change your dressing as told by your health care provider. ? Leave stitches (sutures), staples, skin glue, or adhesive strips in place. These skin closures may need to stay in place for 2 weeks or longer. If adhesive strip edges start to loosen and curl up, you may trim the loose edges. Do not remove adhesive strips completely unless your health care provider tells you to do that.  Check your incision area every day for signs of infection. Check for: ? Redness, swelling, or pain. ? Fluid or blood. ? Warmth. ? Pus or a bad smell. Activity   Do not drive or use heavy machinery while taking  prescription pain medicine.  Do not drive for 24 hours if you received a medicine to help you relax (sedative) during your procedure.  Take frequent, short walks throughout the day. Rest when you get tired. Ask your health care provider what activities are safe for you.  Avoid activity that requires great effort. Also, avoid heavy lifting. Do not lift anything that is heavier than 10 lbs. (4.5 kg), or the limit that your health care provider tells you, until he or she says that it is safe to do so.  Do not douche, use tampons, or have sex until  your health care provider approves. General instructions   To prevent or treat constipation while you are taking prescription pain medicine, your health care provider may recommend that you: ? Drink enough fluid to keep your urine clear or pale yellow. ? Take over-the-counter or prescription medicines. ? Eat foods that are high in fiber, such as fresh fruits and vegetables, whole grains, and beans. ? Limit foods that are high in fat and processed sugars, such as fried and sweet foods.  Take over-the-counter and prescription medicines only as told by your health care provider.  Do not take baths, swim, or use a hot tub until your health care provider approves. Ask your health care provider if you can take showers. You may only be allowed to take sponge baths for bathing.  Wear compression stockings as told by your health care provider. These stockings help to prevent blood clots and reduce swelling in your legs.  Keep all follow-up visits as told by your health care provider. This is important. Contact a health care provider if:  You have pain when you urinate.  You have pus or a bad smelling discharge coming from your vagina.  You have redness, swelling, or pain around your incision.  You have fluid or blood coming from your incision.  Your incision feels warm to the touch.  You have pus or a bad smell coming from your incision.  You have a fever.  Your incision starts to break open.  You have pain in the abdomen, and it gets worse or does not get better when you take medicine.  You develop a rash.  You develop nausea and vomiting.  You feel lightheaded. Get help right away if:  You develop pain in your chest or leg.  You become short of breath.  You faint.  You have increased bleeding from your vagina. Summary  After the procedure, it is common to have pain, bleeding in the vagina, and symptoms of menopause.  Follow instructions from your health care provider  about how to take care of your incision.  Follow instructions from your health care provider about activities and restrictions.  Check your incision every day for signs of infection and report any symptoms to your health care provider. This information is not intended to replace advice given to you by your health care provider. Make sure you discuss any questions you have with your health care provider. Document Released: 12/28/2004 Document Revised: 03/03/2018 Document Reviewed: 02/02/2016 Elsevier Patient Education  2020 [employer].

## 2018-09-28 NOTE — Anesthesia Postprocedure Evaluation (Signed)
Anesthesia Post Note  Patient: Investment banker, operational  Procedure(s) Performed: XI ROBOTIC ASSISTED BILATERAL SALPINGO OOPHORECTOMY, (Bilateral )     Patient location during evaluation: PACU Anesthesia Type: General Level of consciousness: awake and alert Pain management: pain level controlled Vital Signs Assessment: post-procedure vital signs reviewed and stable Respiratory status: spontaneous breathing, nonlabored ventilation, respiratory function stable and patient connected to nasal cannula oxygen Cardiovascular status: blood pressure returned to baseline and stable Postop Assessment: no apparent nausea or vomiting Anesthetic complications: no    Last Vitals:  Vitals:   09/28/18 1145 09/28/18 1240  BP: 110/69 107/65  Pulse: 82 85  Resp: 15 18  Temp:  (!) 36.4 C  SpO2: 95% 93%    Last Pain:  Vitals:   09/28/18 1240  TempSrc: Oral  PainSc: 0-No pain                 Tiajuana Amass

## 2018-09-28 NOTE — Transfer of Care (Signed)
Immediate Anesthesia Transfer of Care Note  Patient: Wendy [name]  Procedure(s) Performed: XI ROBOTIC ASSISTED BILATERAL SALPINGO OOPHORECTOMY, (Bilateral )  Patient Location: PACU  Anesthesia Type:General  Level of Consciousness: sedated  Airway & Oxygen Therapy: Patient Spontanous Breathing and Patient connected to face mask oxygen  Post-op Assessment: Report given to RN and Post -op Vital signs reviewed and stable  Post vital signs: Reviewed and stable  Last Vitals:  Vitals Value Taken Time  BP 103/59 09/28/18 0953  Temp    Pulse 86 09/28/18 0954  Resp 11 09/28/18 0954  SpO2 100 % 09/28/18 0954  Vitals shown include unvalidated device data.  Last Pain:  Vitals:   09/28/18 0631  TempSrc:   PainSc: 0-No pain         Complications: No apparent anesthesia complications

## 2018-09-28 NOTE — Anesthesia Procedure Notes (Signed)
Date/Time: 09/28/2018 9:39 AM Performed by: Cynda Familia, CRNA Oxygen Delivery Method: Simple face mask Placement Confirmation: positive ETCO2 and breath sounds checked- equal and bilateral Dental Injury: Teeth and Oropharynx as per pre-operative assessment

## 2018-09-29 ENCOUNTER — Telehealth: Payer: Self-pay

## 2018-09-29 ENCOUNTER — Encounter (HOSPITAL_COMMUNITY): Payer: Self-pay | Admitting: Gynecologic Oncology

## 2018-09-29 NOTE — Telephone Encounter (Signed)
I spoke this am with Wendy [name].  She is having cramping pain. She took tylenol that decreased her pain.  She has not gotten the tramadol from the pharmacy yet. She states she will get it today.  I told her she could take ibuprofen as well she stated she did not have any.  She is not passing gas yet.  I encouraged her to take senokot-s 2 tabs BID.  She stated she had small amount of bloody vaginal discharge yesterday but none today.  She denies n/v and fever. No drainage from surgical sites. I encouraged patient to be active and to do dep breathing throughout the day.  She verbalized understanding.

## 2018-10-01 LAB — TYPE AND SCREEN
ABO/RH(D): B POS
Antibody Screen: POSITIVE
Unit division: 0
Unit division: 0
Unit division: 0
Unit division: 0

## 2018-10-01 LAB — BPAM RBC
Blood Product Expiration Date: 202010062359
Blood Product Expiration Date: 202010082359
Blood Product Expiration Date: 202010162359
Blood Product Expiration Date: 202010162359
Unit Type and Rh: 7300
Unit Type and Rh: 7300
Unit Type and Rh: 7300
Unit Type and Rh: 7300

## 2018-10-02 ENCOUNTER — Telehealth: Payer: Self-pay

## 2018-10-02 LAB — SURGICAL PATHOLOGY

## 2018-10-02 NOTE — Telephone Encounter (Signed)
Told Ms Flinchbaugh that the surgical pathology showed no cancer per Joylene John, NP. Keep post op appointment on 10-19-18 at 1445 as scheduled. Pt verbalized understanding.

## 2018-10-03 LAB — CYTOLOGY - NON PAP

## 2018-10-04 ENCOUNTER — Ambulatory Visit (INDEPENDENT_AMBULATORY_CARE_PROVIDER_SITE_OTHER): Payer: Medicare HMO | Admitting: *Deleted

## 2018-10-04 DIAGNOSIS — I639 Cerebral infarction, unspecified: Secondary | ICD-10-CM

## 2018-10-05 ENCOUNTER — Other Ambulatory Visit: Payer: Self-pay

## 2018-10-05 ENCOUNTER — Encounter: Payer: Medicare HMO | Attending: Physical Medicine & Rehabilitation | Admitting: Physical Medicine & Rehabilitation

## 2018-10-05 ENCOUNTER — Encounter: Payer: Self-pay | Admitting: Physical Medicine & Rehabilitation

## 2018-10-05 VITALS — BP 115/76 | HR 90 | Temp 97.5°F | Ht 64.0 in | Wt 166.0 lb

## 2018-10-05 DIAGNOSIS — Z87891 Personal history of nicotine dependence: Secondary | ICD-10-CM | POA: Diagnosis not present

## 2018-10-05 DIAGNOSIS — I69354 Hemiplegia and hemiparesis following cerebral infarction affecting left non-dominant side: Secondary | ICD-10-CM | POA: Diagnosis not present

## 2018-10-05 DIAGNOSIS — Z800 Family history of malignant neoplasm of digestive organs: Secondary | ICD-10-CM | POA: Diagnosis not present

## 2018-10-05 DIAGNOSIS — G8112 Spastic hemiplegia affecting left dominant side: Secondary | ICD-10-CM | POA: Diagnosis not present

## 2018-10-05 DIAGNOSIS — E119 Type 2 diabetes mellitus without complications: Secondary | ICD-10-CM | POA: Diagnosis not present

## 2018-10-05 DIAGNOSIS — E785 Hyperlipidemia, unspecified: Secondary | ICD-10-CM | POA: Insufficient documentation

## 2018-10-05 DIAGNOSIS — I10 Essential (primary) hypertension: Secondary | ICD-10-CM | POA: Diagnosis not present

## 2018-10-05 DIAGNOSIS — I700 Atherosclerosis of aorta: Secondary | ICD-10-CM | POA: Insufficient documentation

## 2018-10-05 DIAGNOSIS — Z8249 Family history of ischemic heart disease and other diseases of the circulatory system: Secondary | ICD-10-CM | POA: Diagnosis not present

## 2018-10-05 LAB — CUP PACEART REMOTE DEVICE CHECK
Date Time Interrogation Session: 20200923221039
Implantable Pulse Generator Implant Date: 20170829

## 2018-10-05 NOTE — Patient Instructions (Signed)

## 2018-10-05 NOTE — Progress Notes (Signed)
Botox Injection for spasticity using needle EMG guidance  Dilution: 50 Units/ml Indication: Severe spasticity which interferes with ADL,mobility and/or  hygiene and is unresponsive to medication management and other conservative care Informed consent was obtained after describing risks and benefits of the procedure with the patient. This includes bleeding, bruising, infection, excessive weakness, or medication side effects. A REMS form is on file and signed. Needle: 27g 1" needle electrode Number of units per muscle LEFT Biceps75  FDS25 FDP50 FPL50  All injections were done after obtaining appropriate EMG activity and after negative drawback for blood. The patient tolerated the procedure well. Post procedure instructions were given. A followup appointment was made.    

## 2018-10-10 ENCOUNTER — Telehealth: Payer: Self-pay | Admitting: *Deleted

## 2018-10-10 ENCOUNTER — Encounter: Payer: Self-pay | Admitting: Gynecologic Oncology

## 2018-10-10 NOTE — Telephone Encounter (Signed)
Patient called and stated that she needs a work note to be out of work from 9/10 to 10/8. Message forwarded to [facility] APP

## 2018-10-10 NOTE — Progress Notes (Signed)
Carelink Summary Report / Loop Recorder 
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
## 2018-10-19 ENCOUNTER — Inpatient Hospital Stay: Payer: Medicare HMO | Attending: Gynecologic Oncology | Admitting: Gynecologic Oncology

## 2018-10-19 ENCOUNTER — Other Ambulatory Visit: Payer: Self-pay

## 2018-10-19 VITALS — BP 126/69 | HR 84 | Temp 98.5°F | Resp 18 | Ht 64.0 in | Wt 171.0 lb

## 2018-10-19 DIAGNOSIS — Z90722 Acquired absence of ovaries, bilateral: Secondary | ICD-10-CM | POA: Insufficient documentation

## 2018-10-19 DIAGNOSIS — N83202 Unspecified ovarian cyst, left side: Secondary | ICD-10-CM

## 2018-10-19 NOTE — Patient Instructions (Signed)
Follow-up with Dr. Elly Modena    Thank you very much Ms. Gearldine Bienenstock for allowing me to provide care for you today.  I appreciate your confidence in choosing our Gynecologic Oncology team.  If you have any questions about your visit today please call our office and we will get back to you as soon as possible.  Please consider using the website Medlineplus.gov as an Geneticist, molecular.   Francetta Found. Angeleena Dueitt MD., PhD Gynecologic Oncology

## 2018-10-23 ENCOUNTER — Telehealth: Payer: Self-pay | Admitting: *Deleted

## 2018-10-23 ENCOUNTER — Encounter: Payer: Self-pay | Admitting: Gynecologic Oncology

## 2018-10-23 NOTE — Telephone Encounter (Signed)
Patient called and left a message stated "I need a signed letter from Dr. Skeet Latch stating specifically I can return to work and resume performing professional duties as performed previous/prior to surgery. I start back on Saturday October 17th."   Message forwarded to [facility] APP

## 2018-10-30 ENCOUNTER — Ambulatory Visit: Payer: Medicare HMO | Admitting: Physical Medicine & Rehabilitation

## 2018-11-06 ENCOUNTER — Ambulatory Visit (INDEPENDENT_AMBULATORY_CARE_PROVIDER_SITE_OTHER): Payer: Medicare HMO | Admitting: *Deleted

## 2018-11-06 DIAGNOSIS — Z8673 Personal history of transient ischemic attack (TIA), and cerebral infarction without residual deficits: Secondary | ICD-10-CM | POA: Diagnosis not present

## 2018-11-07 LAB — CUP PACEART REMOTE DEVICE CHECK
Date Time Interrogation Session: 20201026220958
Implantable Pulse Generator Implant Date: 20170829

## 2018-11-10 ENCOUNTER — Encounter: Payer: Medicare HMO | Attending: Physical Medicine & Rehabilitation | Admitting: Physical Medicine & Rehabilitation

## 2018-11-10 ENCOUNTER — Other Ambulatory Visit: Payer: Self-pay

## 2018-11-10 ENCOUNTER — Encounter: Payer: Self-pay | Admitting: Physical Medicine & Rehabilitation

## 2018-11-10 VITALS — BP 122/76 | HR 76 | Temp 97.7°F | Ht 64.0 in | Wt 171.0 lb

## 2018-11-10 DIAGNOSIS — M533 Sacrococcygeal disorders, not elsewhere classified: Secondary | ICD-10-CM

## 2018-11-10 NOTE — Progress Notes (Signed)
Left sacroiliac injection under fluoroscopic guidance  Indication: Left Low back and buttocks pain not relieved by medication management and other conservative care.  Informed consent was obtained after describing risks and benefits of the procedure with the patient, this includes bleeding, bruising, infection, paralysis and medication side effects. The patient wishes to proceed and has given written consent. The patient was placed in a prone position. The lumbar and sacral area was marked and prepped with Betadine. A 25-gauge 1-1/2 inch needle was inserted into the skin and subcutaneous tissue and 1 mL of 1% lidocaine was injected. Then a 25-gauge 3 inch spinal needle was inserted under fluoroscopic guidance into the left sacroiliac joint. AP and lateral images were utilized. Isovue 200x0.5 mL under live fluoroscopy demonstrated no intravascular uptake. Then a solution containing one ML of 6 mg per mLbetamethasone and 2 ML of 2% lidocaine MPF was injected x1.5 mL. Patient tolerated the procedure well. Post procedure instructions were given. Please see post procedure form. 

## 2018-11-10 NOTE — Patient Instructions (Signed)
Sacroiliac injection was performed today. A combination of a naming medicine plus a cortisone medicine was injected. The injection was done under x-ray guidance. This procedure has been performed to help reduce low back and buttocks pain as well as potentially hip pain. The duration of this injection is variable lasting from hours to  Months. It may repeated if needed. 

## 2018-11-10 NOTE — Progress Notes (Signed)
  PROCEDURE RECORD [facility]   Name: Wendy [name] DOB:Jun 20, 1955 MRN: [id number]  Date:11/10/2018  Physician: Alysia Penna, MD    Nurse/CMA: Castiel Lauricella, CMA   Allergies:  Allergies  Allergen Reactions  . Hydrocodone-Ibuprofen     Unknown to pt  . Lisinopril Cough  . Vicodin [Hydrocodone-Acetaminophen] Palpitations    Pt states she can take tylenol    Consent Signed: Yes.    Is patient diabetic? No.  CBG today?   Pregnant: No. LMP: No LMP recorded. Patient is postmenopausal. (age [age])  Anticoagulants: no Anti-inflammatory: no Antibiotics: no  Procedure: left sacroiliac steroid injection  Position: Prone   Start Time: 222pm End Time: 226pm Fluoro Time: 20s  RN/CMA Wessling, CMA Shermaine Brigham, CMA    Time 2:10pm 234;pm    BP 122/76 133/79    Pulse 76 75    Respirations 16 16    O2 Sat 96 97    S/S 6 6    Pain Level 5/10 0/10     D/C home with SCAT, patient A & O X 3, D/C instructions reviewed, and sits independently.

## 2018-11-16 ENCOUNTER — Encounter: Payer: Medicare HMO | Admitting: Physical Medicine & Rehabilitation

## 2018-11-24 NOTE — Progress Notes (Signed)
Carelink Summary Report / Loop Recorder 

## 2018-12-04 ENCOUNTER — Telehealth: Payer: Self-pay | Admitting: Neurology

## 2018-12-04 NOTE — Telephone Encounter (Signed)
Patient called and left a message requesting the referral again.
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
## 2018-12-05 ENCOUNTER — Other Ambulatory Visit: Payer: Self-pay

## 2018-12-05 DIAGNOSIS — Z8673 Personal history of transient ischemic attack (TIA), and cerebral infarction without residual deficits: Secondary | ICD-10-CM

## 2018-12-05 NOTE — Telephone Encounter (Signed)
Pt informed of referral being placed to Dr. Chales Salmon for second opinion.

## 2018-12-05 NOTE — Telephone Encounter (Signed)
Dr. Chales Salmon is a neurologist, it sounds like she wants a second opinion, that is fine to send referral for "second opinion per patient request". Pls ask patient what Next level care [facility] is for, I'm not quite sure. Thanks

## 2018-12-05 NOTE — Telephone Encounter (Signed)
Dr. Delice Lesch  Are you ok referring this pt out?

## 2018-12-10 LAB — CUP PACEART REMOTE DEVICE CHECK
Date Time Interrogation Session: 20201128174244
Implantable Pulse Generator Implant Date: 20170829

## 2018-12-11 ENCOUNTER — Ambulatory Visit (INDEPENDENT_AMBULATORY_CARE_PROVIDER_SITE_OTHER): Payer: Medicare HMO | Admitting: *Deleted

## 2018-12-11 DIAGNOSIS — Z8673 Personal history of transient ischemic attack (TIA), and cerebral infarction without residual deficits: Secondary | ICD-10-CM

## 2018-12-12 ENCOUNTER — Telehealth: Payer: Self-pay | Admitting: Emergency Medicine

## 2018-12-12 NOTE — Telephone Encounter (Signed)
Notified patient that Wendy [name] is at [facility]. Patient would like to schedule removal of LINQ.

## 2018-12-12 NOTE — Telephone Encounter (Signed)
Thanks, we will be in touch with her.

## 2018-12-19 NOTE — Telephone Encounter (Signed)
Left a message for the patient to call back concerning the Loop recorder explant. She can be scheduled Monday 12/14 at 3 pm.

## 2018-12-21 ENCOUNTER — Encounter: Payer: Self-pay | Admitting: *Deleted

## 2018-12-21 NOTE — Telephone Encounter (Signed)
MyChart message has been sent

## 2018-12-21 NOTE — Telephone Encounter (Signed)
Left a message for the patient to call back.  

## 2018-12-22 NOTE — Telephone Encounter (Addendum)
Left a message for the patient to call back.  This is the fourth attempt to reach the patient. Encounter will be closed.

## 2018-12-29 ENCOUNTER — Encounter: Payer: Self-pay | Admitting: Physical Medicine & Rehabilitation

## 2018-12-29 ENCOUNTER — Other Ambulatory Visit: Payer: Self-pay

## 2018-12-29 ENCOUNTER — Encounter: Payer: Medicare HMO | Attending: Physical Medicine & Rehabilitation | Admitting: Physical Medicine & Rehabilitation

## 2018-12-29 VITALS — BP 144/82 | HR 81 | Temp 97.8°F | Ht 64.0 in | Wt 179.8 lb

## 2018-12-29 DIAGNOSIS — G8110 Spastic hemiplegia affecting unspecified side: Secondary | ICD-10-CM

## 2018-12-29 DIAGNOSIS — Z8249 Family history of ischemic heart disease and other diseases of the circulatory system: Secondary | ICD-10-CM | POA: Diagnosis not present

## 2018-12-29 DIAGNOSIS — M545 Low back pain, unspecified: Secondary | ICD-10-CM

## 2018-12-29 DIAGNOSIS — I69354 Hemiplegia and hemiparesis following cerebral infarction affecting left non-dominant side: Secondary | ICD-10-CM | POA: Insufficient documentation

## 2018-12-29 DIAGNOSIS — E119 Type 2 diabetes mellitus without complications: Secondary | ICD-10-CM | POA: Diagnosis not present

## 2018-12-29 DIAGNOSIS — Z800 Family history of malignant neoplasm of digestive organs: Secondary | ICD-10-CM | POA: Insufficient documentation

## 2018-12-29 DIAGNOSIS — Z87891 Personal history of nicotine dependence: Secondary | ICD-10-CM | POA: Diagnosis not present

## 2018-12-29 DIAGNOSIS — I10 Essential (primary) hypertension: Secondary | ICD-10-CM | POA: Diagnosis not present

## 2018-12-29 DIAGNOSIS — E785 Hyperlipidemia, unspecified: Secondary | ICD-10-CM | POA: Diagnosis not present

## 2018-12-29 NOTE — Progress Notes (Signed)
Botox Injection for spasticity using needle EMG guidance  Dilution: 50 Units/ml Indication: Severe spasticity which interferes with ADL,mobility and/or  hygiene and is unresponsive to medication management and other conservative care Informed consent was obtained after describing risks and benefits of the procedure with the patient. This includes bleeding, bruising, infection, excessive weakness, or medication side effects. A REMS form is on file and signed. Needle: 27g 1" needle electrode Number of units per muscle LEFT Biceps75  FDS25 FDP50 FPL50  All injections were done after obtaining appropriate EMG activity and after negative drawback for blood. The patient tolerated the procedure well. Post procedure instructions were given. A followup appointment was made.   Discussed relief of left-sided low back and buttock pain following left sacroiliac injection under fluoroscopic guidance.  100% relief for approximately 4 to 5 weeks.  Discussed left L5 dorsal ramus left S1-S2-S3 lateral branch blocks as next step to see if radiofrequency neurotomy would be an option for longer duration pain relief

## 2018-12-29 NOTE — Patient Instructions (Signed)

## 2019-01-03 NOTE — Progress Notes (Signed)
ILR remote 

## 2019-01-11 ENCOUNTER — Ambulatory Visit (INDEPENDENT_AMBULATORY_CARE_PROVIDER_SITE_OTHER): Payer: Medicare HMO | Admitting: *Deleted

## 2019-01-11 DIAGNOSIS — Z8673 Personal history of transient ischemic attack (TIA), and cerebral infarction without residual deficits: Secondary | ICD-10-CM | POA: Diagnosis not present

## 2019-01-12 LAB — CUP PACEART REMOTE DEVICE CHECK
Date Time Interrogation Session: 20201231203408
Implantable Pulse Generator Implant Date: 20170829

## 2019-01-19 ENCOUNTER — Encounter: Payer: Self-pay | Admitting: *Deleted

## 2019-01-22 ENCOUNTER — Telehealth: Payer: Self-pay

## 2019-01-22 NOTE — Telephone Encounter (Signed)
Patient called wanting to know why her procedure needs to go through prior auth.

## 2019-01-26 ENCOUNTER — Encounter: Payer: Self-pay | Admitting: Physical Medicine & Rehabilitation

## 2019-01-26 ENCOUNTER — Other Ambulatory Visit: Payer: Self-pay

## 2019-01-26 ENCOUNTER — Encounter: Payer: Medicare Other | Attending: Physical Medicine & Rehabilitation | Admitting: Physical Medicine & Rehabilitation

## 2019-01-26 VITALS — BP 116/75 | HR 83 | Temp 97.7°F | Ht 64.0 in | Wt 177.0 lb

## 2019-01-26 DIAGNOSIS — Z8249 Family history of ischemic heart disease and other diseases of the circulatory system: Secondary | ICD-10-CM | POA: Diagnosis not present

## 2019-01-26 DIAGNOSIS — G8929 Other chronic pain: Secondary | ICD-10-CM | POA: Diagnosis not present

## 2019-01-26 DIAGNOSIS — I10 Essential (primary) hypertension: Secondary | ICD-10-CM | POA: Diagnosis not present

## 2019-01-26 DIAGNOSIS — E785 Hyperlipidemia, unspecified: Secondary | ICD-10-CM | POA: Diagnosis not present

## 2019-01-26 DIAGNOSIS — Z800 Family history of malignant neoplasm of digestive organs: Secondary | ICD-10-CM | POA: Insufficient documentation

## 2019-01-26 DIAGNOSIS — E119 Type 2 diabetes mellitus without complications: Secondary | ICD-10-CM | POA: Insufficient documentation

## 2019-01-26 DIAGNOSIS — M533 Sacrococcygeal disorders, not elsewhere classified: Secondary | ICD-10-CM | POA: Diagnosis not present

## 2019-01-26 DIAGNOSIS — Z87891 Personal history of nicotine dependence: Secondary | ICD-10-CM | POA: Insufficient documentation

## 2019-01-26 DIAGNOSIS — I69354 Hemiplegia and hemiparesis following cerebral infarction affecting left non-dominant side: Secondary | ICD-10-CM | POA: Diagnosis not present

## 2019-01-26 NOTE — Progress Notes (Signed)
L5 dorsal ramus S1-S2-S3 lateral branch blocks under fluoroscopic guidance Left side  Informed consent was obtained after describing risks and benefits of the procedure with patient these include bleeding bruising and infection.  He elects to proceed and has given written consent.  Patient placed prone on fluoroscopy table Betadine prep sterile drape a 25-gauge 1.5 inch needle was used to anesthetize skin and subcu tissue with 1% lidocaine 1 cc into each of 4 sites.  Then a 25-gauge 3" needle was inserted under fluoroscopic guidance for starting the S1 SAP sacral ala junction.  Bone contact made.  Isovue 200 x 0.5 mL demonstrated no intravascular uptake then 0.5 mL of 2% lidocaine was injected.  Then the lateral aspect of the S1, S2, S3 foramen was targeted.  Bone contact made out, Isovue-200 times 0.5 mL demonstrated no nerve root or intravascular uptake within 0.5 mL of 2% lidocaine solution was injected after negative drawback for blood.  Patient tolerated procedure well.  Postinjection instructions given.

## 2019-01-26 NOTE — Patient Instructions (Signed)
Next time we will do radiofrequency neurotomy of the nerves we injected today.  THis is so we can get a longer term pain relief

## 2019-01-26 NOTE — Progress Notes (Signed)
  PROCEDURE RECORD [facility]   Name: Wendy [name] DOB:[birth year] MRN: [id number]  Date:01/26/2019  Physician: Alysia Penna, MD    Nurse/CMA: Truman Hayward, CMA  Allergies:  Allergies  Allergen Reactions  . Hydrocodone-Ibuprofen     Unknown to pt  . Lisinopril Cough  . Vicodin [Hydrocodone-Acetaminophen] Palpitations    Pt states she can take tylenol    Consent Signed: Yes.    Is patient diabetic? Yes.    CBG today? unsure  Pregnant: No. LMP: No LMP recorded. Patient is postmenopausal. (age 30-55)  Anticoagulants: no Anti-inflammatory: no Antibiotics: no  Procedure: Left L5 Dorsal Ramus, Left S1,2,3 Lateral Branch Block Position: Prone Start Time: 1:37pm End Time: 1:45pm Fluoro Time: 29  RN/CMA /wessling, CMA Joyanna Kleman, CMA    Time 1:00pm 1:50 pm    BP 116/75 159/88    Pulse 83 80    Respirations 16 16    O2 Sat 97 96    S/S 6 6    Pain Level 5/10 0/10     D/C home with transportation SCAT, patient A & O X 3, D/C instructions reviewed, and sits independently.

## 2019-02-02 ENCOUNTER — Other Ambulatory Visit: Payer: Self-pay | Admitting: Family Medicine

## 2019-02-02 DIAGNOSIS — E0865 Diabetes mellitus due to underlying condition with hyperglycemia: Secondary | ICD-10-CM

## 2019-02-02 DIAGNOSIS — R0989 Other specified symptoms and signs involving the circulatory and respiratory systems: Secondary | ICD-10-CM

## 2019-02-02 DIAGNOSIS — E7849 Other hyperlipidemia: Secondary | ICD-10-CM

## 2019-02-02 DIAGNOSIS — Z8673 Personal history of transient ischemic attack (TIA), and cerebral infarction without residual deficits: Secondary | ICD-10-CM

## 2019-02-02 NOTE — Telephone Encounter (Signed)
Last ov 09/03/2 Last fill 07/18/18  #180/1

## 2019-02-27 ENCOUNTER — Encounter: Payer: Self-pay | Admitting: Physical Medicine & Rehabilitation

## 2019-02-27 ENCOUNTER — Encounter: Payer: Medicare Other | Attending: Physical Medicine & Rehabilitation | Admitting: Physical Medicine & Rehabilitation

## 2019-02-27 ENCOUNTER — Other Ambulatory Visit: Payer: Self-pay

## 2019-02-27 VITALS — BP 127/69 | HR 75 | Temp 97.3°F | Ht 64.0 in | Wt 179.0 lb

## 2019-02-27 DIAGNOSIS — Z800 Family history of malignant neoplasm of digestive organs: Secondary | ICD-10-CM | POA: Diagnosis not present

## 2019-02-27 DIAGNOSIS — I10 Essential (primary) hypertension: Secondary | ICD-10-CM | POA: Insufficient documentation

## 2019-02-27 DIAGNOSIS — E119 Type 2 diabetes mellitus without complications: Secondary | ICD-10-CM | POA: Insufficient documentation

## 2019-02-27 DIAGNOSIS — I69354 Hemiplegia and hemiparesis following cerebral infarction affecting left non-dominant side: Secondary | ICD-10-CM | POA: Insufficient documentation

## 2019-02-27 DIAGNOSIS — E785 Hyperlipidemia, unspecified: Secondary | ICD-10-CM | POA: Insufficient documentation

## 2019-02-27 DIAGNOSIS — Z8249 Family history of ischemic heart disease and other diseases of the circulatory system: Secondary | ICD-10-CM | POA: Diagnosis not present

## 2019-02-27 DIAGNOSIS — G8929 Other chronic pain: Secondary | ICD-10-CM

## 2019-02-27 DIAGNOSIS — Z87891 Personal history of nicotine dependence: Secondary | ICD-10-CM | POA: Insufficient documentation

## 2019-02-27 DIAGNOSIS — M533 Sacrococcygeal disorders, not elsewhere classified: Secondary | ICD-10-CM | POA: Diagnosis not present

## 2019-02-27 DIAGNOSIS — M545 Low back pain: Secondary | ICD-10-CM

## 2019-02-27 NOTE — Progress Notes (Signed)
  PROCEDURE RECORD [facility]   Name: Wendy [name] DOB:[birth year] MRN: [id number]  Date:02/27/2019  Physician: Alysia Penna, MD    Nurse/CMA: Jerrika Ledlow CMA  Allergies:  Allergies  Allergen Reactions  . Hydrocodone-Ibuprofen     Unknown to pt  . Lisinopril Cough  . Vicodin [Hydrocodone-Acetaminophen] Palpitations    Pt states she can take tylenol    Consent Signed: Yes.    Is patient diabetic? No.  CBG today? NA  Pregnant: No. LMP: No LMP recorded. Patient is postmenopausal. (age 64-55)  Anticoagulants: no Anti-inflammatory: no Antibiotics: no  Procedure: left sacroiliac RFA  Position: Prone   Start Time: 1248pm End Time: 100PM Fluoro Time: 25S  RN/CMA Wessling CMA Briena Swingler CMA    Time 1225pm 108PM    BP 127/69 137/81    Pulse 75 78    Respirations 16 16    O2 Sat 94 98    S/S 6 6    Pain Level 3/10 0/10     D/C home with SCAT, patient A & O X 3, D/C instructions reviewed, and sits independently.

## 2019-02-27 NOTE — Patient Instructions (Signed)

## 2019-03-06 ENCOUNTER — Telehealth: Payer: Self-pay | Admitting: Cardiology

## 2019-03-06 NOTE — Telephone Encounter (Signed)
Informed pt does not need pre medication per Dr. Curt Bears.

## 2019-03-06 NOTE — Telephone Encounter (Signed)
  1. What dental office are you calling from? Dr. Margaretha Sheffield Brown's office  2. What is your office phone number? [phone]  3. What is your fax number? [phone]  4. What type of procedure is the patient having performed? Potentially removing tissue, not sure   5. What date is procedure scheduled or is the patient there now? Patient is there now (if the patient is at the dentist's office question goes to their cardiologist if he/she is in the office.  If not, question should go to the DOD).   6. What is your question (ex. Antibiotics prior to procedure, holding medication-we need to know how long dentist wants pt to hold med)? Does the patient require a premed prior to dental treatment.

## 2019-04-10 ENCOUNTER — Encounter: Payer: Medicare Other | Attending: Physical Medicine & Rehabilitation | Admitting: Physical Medicine & Rehabilitation

## 2019-04-10 DIAGNOSIS — Z800 Family history of malignant neoplasm of digestive organs: Secondary | ICD-10-CM | POA: Insufficient documentation

## 2019-04-10 DIAGNOSIS — Z87891 Personal history of nicotine dependence: Secondary | ICD-10-CM | POA: Insufficient documentation

## 2019-04-10 DIAGNOSIS — Z8249 Family history of ischemic heart disease and other diseases of the circulatory system: Secondary | ICD-10-CM | POA: Insufficient documentation

## 2019-04-10 DIAGNOSIS — I10 Essential (primary) hypertension: Secondary | ICD-10-CM | POA: Insufficient documentation

## 2019-04-10 DIAGNOSIS — E119 Type 2 diabetes mellitus without complications: Secondary | ICD-10-CM | POA: Insufficient documentation

## 2019-04-10 DIAGNOSIS — I69354 Hemiplegia and hemiparesis following cerebral infarction affecting left non-dominant side: Secondary | ICD-10-CM | POA: Insufficient documentation

## 2019-04-10 DIAGNOSIS — E785 Hyperlipidemia, unspecified: Secondary | ICD-10-CM | POA: Insufficient documentation

## 2019-04-13 IMAGING — DX DG HIP (WITH OR WITHOUT PELVIS) 2-3V*L*
3 series · 3 of 3 positions shown · non-contrast
Comparison: None.

CLINICAL DATA: Left hip pain for 1 month after a fall

EXAM:
DG HIP (WITH OR WITHOUT PELVIS) 2-3V LEFT

[pelvis ap]
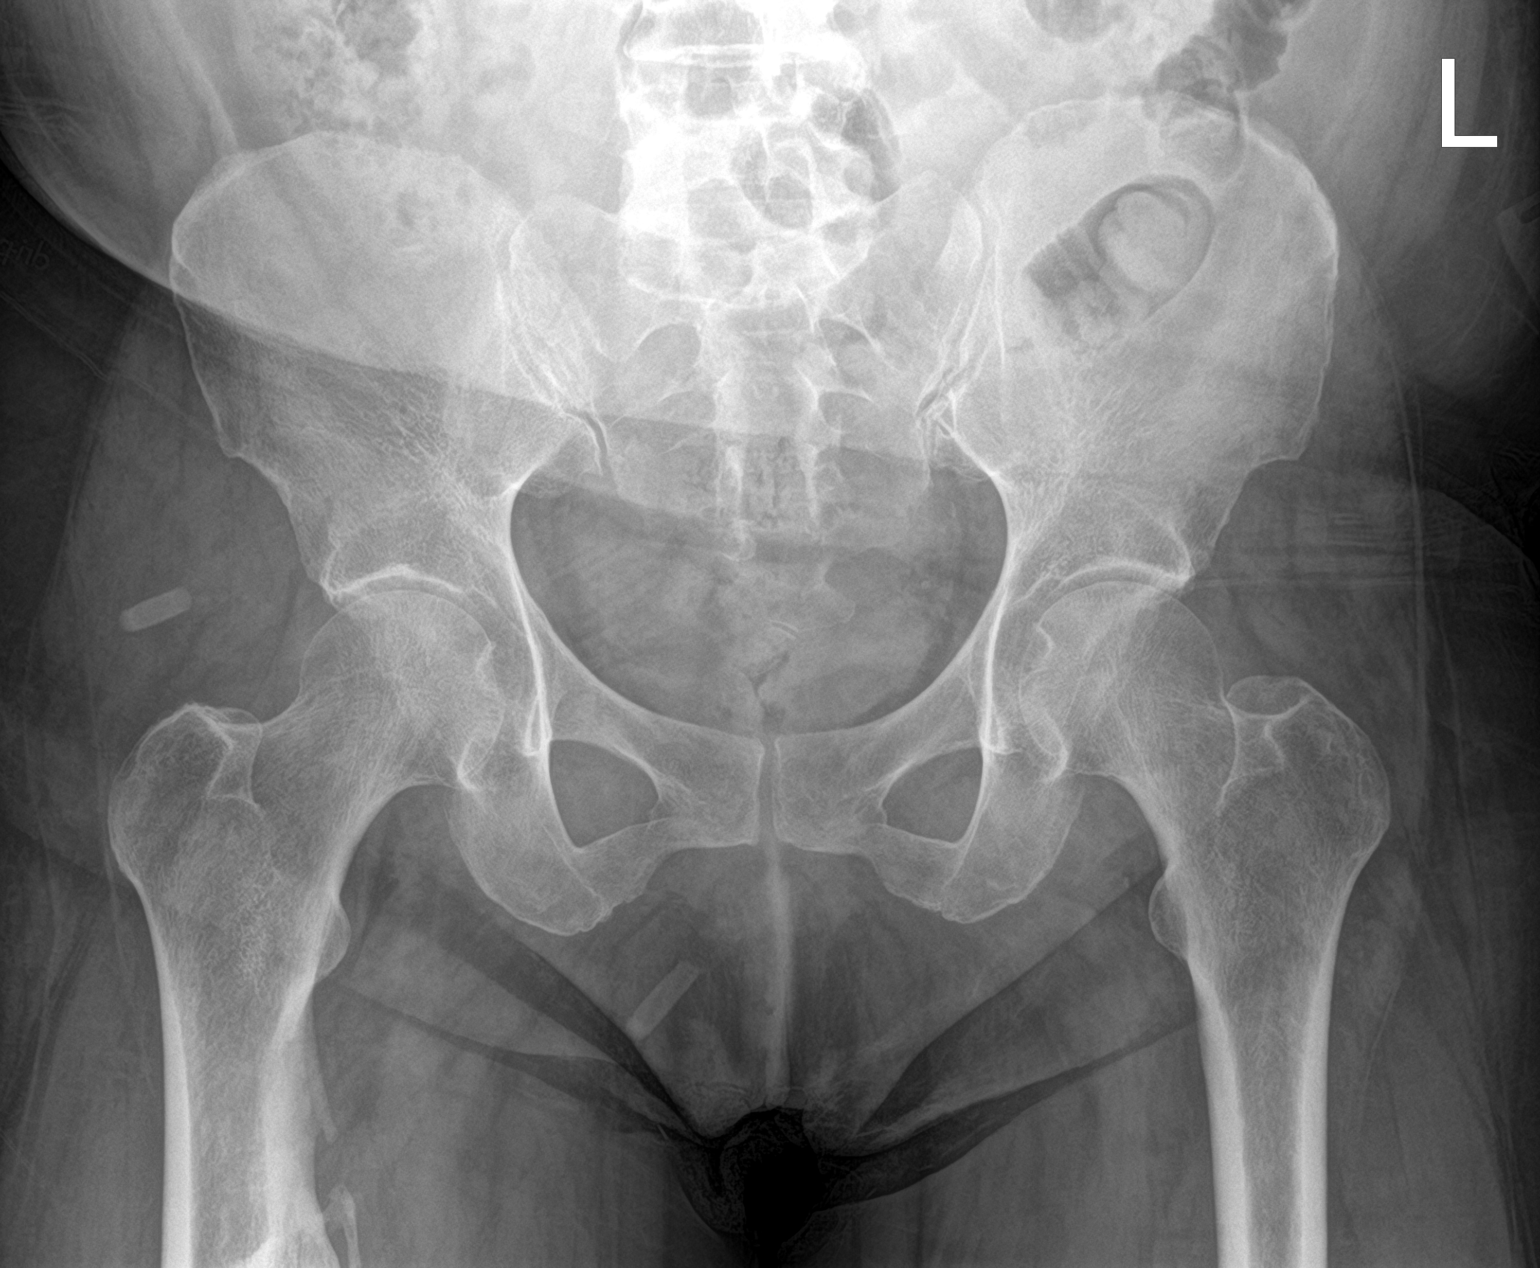

[hip ap]
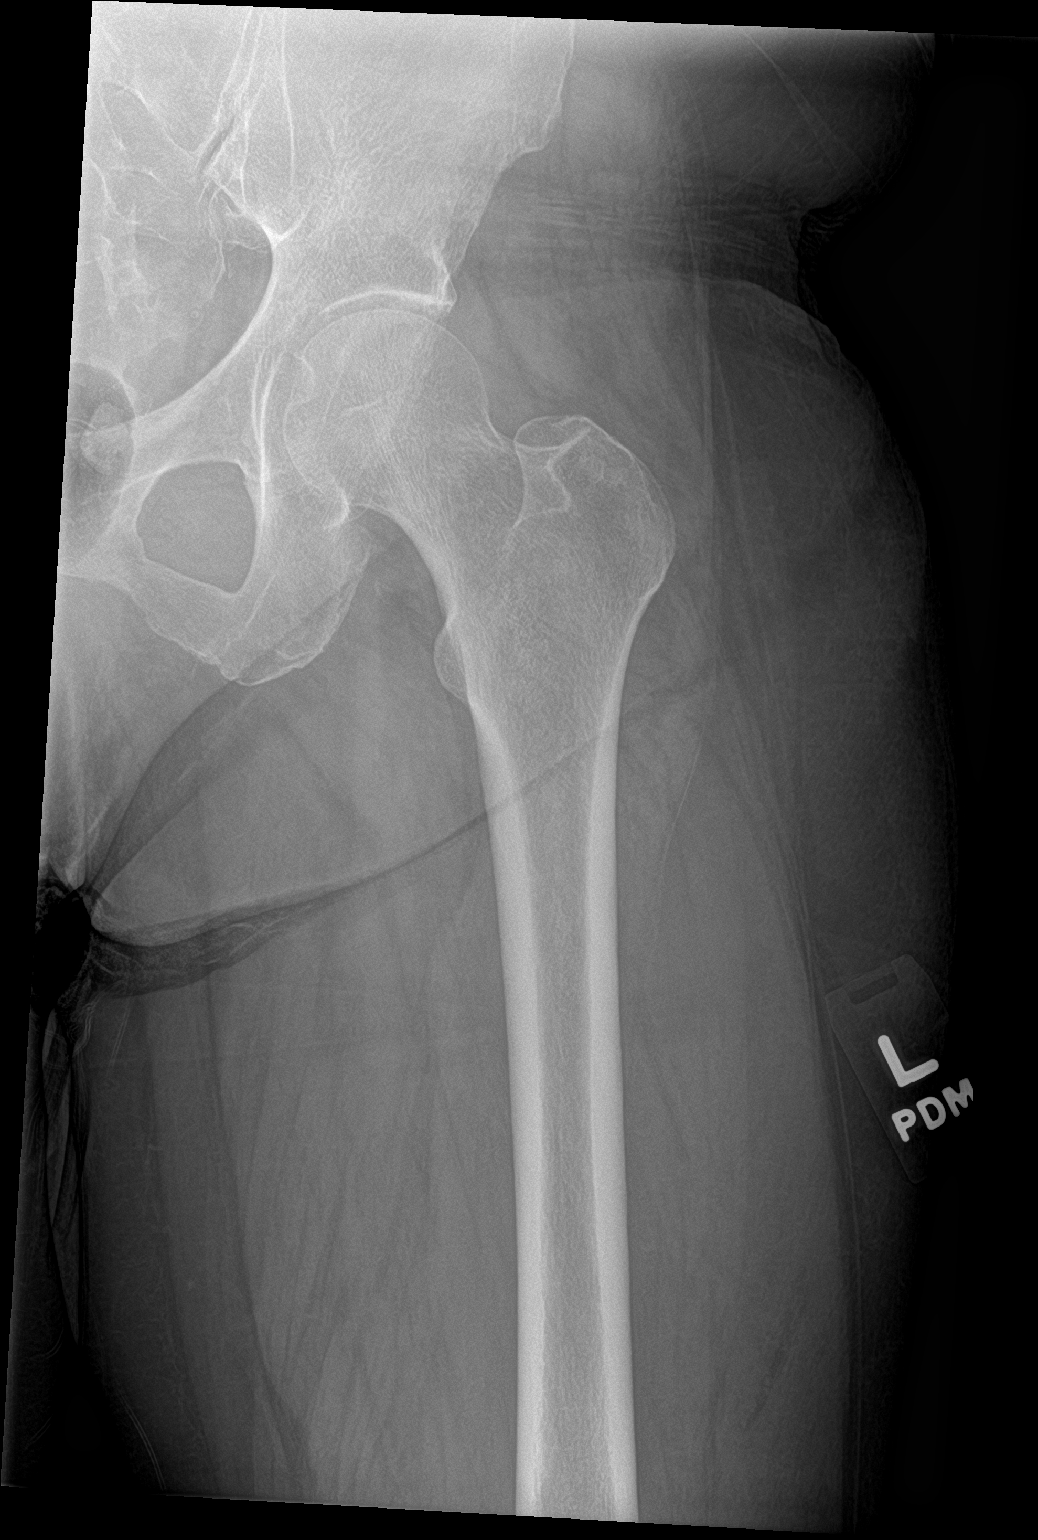

[hip lat]
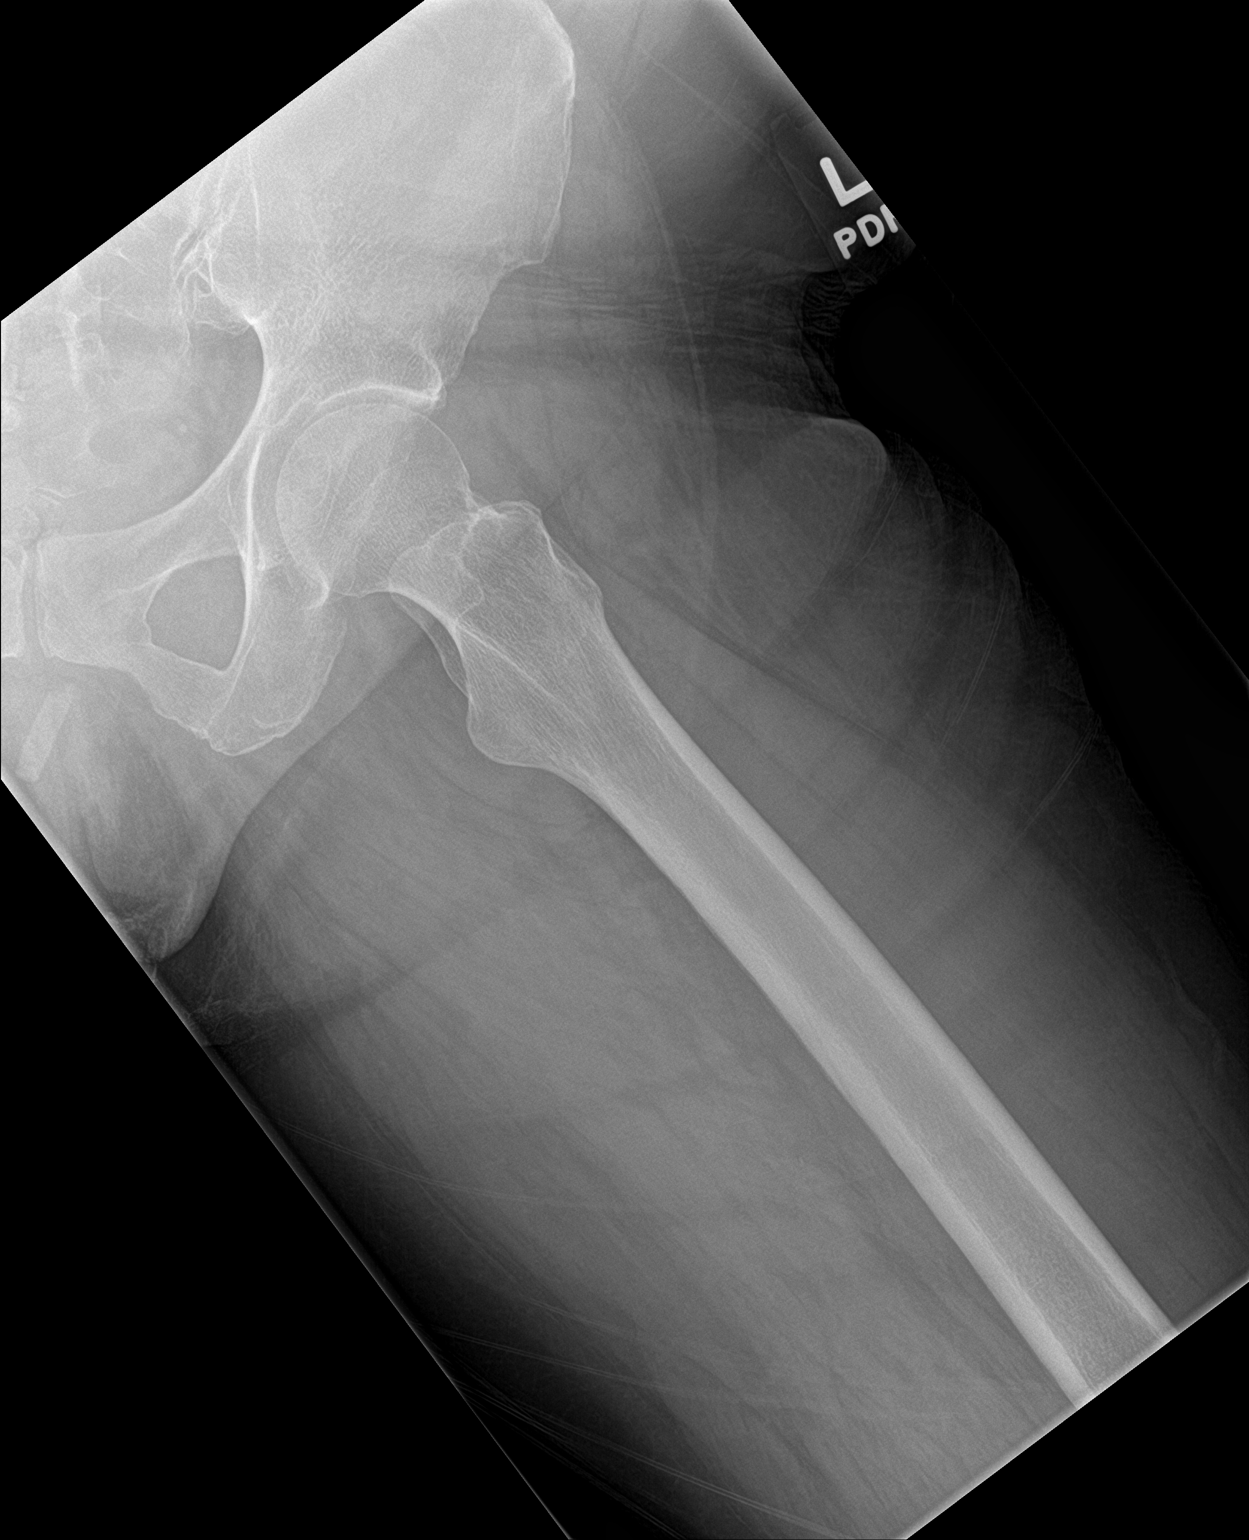

[3 of 3 positions shown; findings below may reference images not displayed]

FINDINGS: There is mild degenerative joint disease of both hips for age. No
acute fracture is seen. There is some irregularity of the proximal
right femoral shaft probably due to prior fracture with healing. The
pelvic rami are intact. The SI joints appear corticated.
IMPRESSION: 1. No acute fracture. Mild to moderate degenerative joint disease of
both hips.
2. Probable old fracture deformity of the proximal right femoral
shaft.

## 2019-04-17 ENCOUNTER — Telehealth: Payer: Self-pay

## 2019-04-17 NOTE — Telephone Encounter (Signed)
Spoke with the patient. She would like to go ahead and schedule her loop recorder explant. She has been advised that these are done on Mondays and that we will look at the schedule and call her back with a date and instructions.

## 2019-04-17 NOTE — Telephone Encounter (Signed)
It may take a while since the device generator changes for ERI will have to take precedence. April 26?

## 2019-04-17 NOTE — Telephone Encounter (Signed)
The pt came to the office to schedule an appointment for Dr. Sallyanne Kuster to change the battery to the loop. [place] tried to contact [employer] but was unsuccessful. I went downstairs to get an updated phone number from the pt so we can get her schedule to get the loop explanted and to talk with Dr. Sallyanne Kuster about her next steps but the pt left. I will try to call the pt tomorrow with the numbers on file.

## 2019-04-18 NOTE — Telephone Encounter (Signed)
The patient has been called and scheduled for the Loop Recorder Explant on 05/21/19 at 1:30 pm.   She will call back if she has any further questions or needs.

## 2019-04-20 ENCOUNTER — Telehealth: Payer: Self-pay

## 2019-04-20 NOTE — Telephone Encounter (Signed)
Spoke with a PA that did a house call with [employer].   She called to report a A1c reading of 11.2. She also reported a PAD screening on Tanayah's right leg that was a moderate reading.

## 2019-04-24 ENCOUNTER — Telehealth: Payer: Self-pay

## 2019-04-24 NOTE — Telephone Encounter (Signed)
Patient has been notified

## 2019-04-24 NOTE — Telephone Encounter (Signed)
Patient called asking when her appt is. 05/24/2019 at 11:15 for Botox with Dr. Letta Pate. Called no answer and VM full.

## 2019-04-27 ENCOUNTER — Telehealth: Payer: Self-pay | Admitting: Internal Medicine

## 2019-04-27 NOTE — Telephone Encounter (Signed)
Checked pt's chart to see if I could see where someone from our office had tried to call her and I could not find anything. Pt has a scheduled appt with MW 4/28. Called and spoke with pt who stated that when someone from our office tried to call her, no message was left. Stated to pt to f/u as scheduled unless the person who originally tried to call her calls back with different info and pt verbalized understanding.  Nothing further needed.

## 2019-04-30 ENCOUNTER — Other Ambulatory Visit: Payer: Self-pay | Admitting: Family Medicine

## 2019-04-30 ENCOUNTER — Other Ambulatory Visit: Payer: Self-pay | Admitting: Neurology

## 2019-04-30 DIAGNOSIS — G40209 Localization-related (focal) (partial) symptomatic epilepsy and epileptic syndromes with complex partial seizures, not intractable, without status epilepticus: Secondary | ICD-10-CM

## 2019-05-07 ENCOUNTER — Telehealth (INDEPENDENT_AMBULATORY_CARE_PROVIDER_SITE_OTHER): Payer: Medicare Other | Admitting: Neurology

## 2019-05-07 ENCOUNTER — Other Ambulatory Visit: Payer: Self-pay

## 2019-05-07 DIAGNOSIS — J690 Pneumonitis due to inhalation of food and vomit: Secondary | ICD-10-CM | POA: Insufficient documentation

## 2019-05-07 DIAGNOSIS — Z5329 Procedure and treatment not carried out because of patient's decision for other reasons: Secondary | ICD-10-CM

## 2019-05-07 DIAGNOSIS — G40909 Epilepsy, unspecified, not intractable, without status epilepticus: Secondary | ICD-10-CM | POA: Insufficient documentation

## 2019-05-07 NOTE — Progress Notes (Signed)
Technical difficulties with virtual visit on 05/07/2019. Patient requested to reschedule for an in-person visit instead. Also requesting for referral to [name], she has heard they can do "non-invasive surgery for stroke patients." Discussed with patient that I am not aware of this program but would be happy to send referral.

## 2019-05-09 ENCOUNTER — Other Ambulatory Visit: Payer: Self-pay

## 2019-05-09 ENCOUNTER — Telehealth: Payer: Self-pay | Admitting: Family Medicine

## 2019-05-09 ENCOUNTER — Ambulatory Visit (INDEPENDENT_AMBULATORY_CARE_PROVIDER_SITE_OTHER): Payer: Medicare Other | Admitting: Internal Medicine

## 2019-05-09 ENCOUNTER — Ambulatory Visit (INDEPENDENT_AMBULATORY_CARE_PROVIDER_SITE_OTHER): Payer: Medicare Other

## 2019-05-09 ENCOUNTER — Encounter: Payer: Self-pay | Admitting: Internal Medicine

## 2019-05-09 DIAGNOSIS — R05 Cough: Secondary | ICD-10-CM

## 2019-05-09 DIAGNOSIS — R058 Other specified cough: Secondary | ICD-10-CM

## 2019-05-09 MED ORDER — FAMOTIDINE 20 MG PO TABS: ORAL_TABLET | ORAL | 11 refills | Status: DC

## 2019-05-09 MED ORDER — MONTELUKAST SODIUM 10 MG PO TABS: 10.0000 mg | ORAL_TABLET | Freq: Every day | ORAL | 11 refills | Status: DC

## 2019-05-09 MED ORDER — PREDNISONE 10 MG PO TABS: ORAL_TABLET | ORAL | 0 refills | Status: DC

## 2019-05-09 MED ORDER — PANTOPRAZOLE SODIUM 40 MG PO TBEC: 40.0000 mg | DELAYED_RELEASE_TABLET | Freq: Every day | ORAL | 2 refills | Status: DC

## 2019-05-09 NOTE — Patient Instructions (Addendum)
Prednisone 10 mg take  4 each am x 2 days,  2 each am x 2 days,  1 each am x 2 days and stop (this is to eliminate allergies and inflammation from coughing)  Protonix (pantoprazole) Take 30-60 min before first meal of the day and Pepcid 20 mg one bedtime plus chlortabs chlorpheniramine 4 mg x 2  One hour before  bedtime (both available over the counter)  until cough is completely gone for at least a week without the need for cough suppression  Singulair 10 mg one daily > resume this as maintenance daily medication   Please remember to go to the  x-ray department  for your tests - we will call you with the results when they are available    Please schedule a follow up visit in  2 weeks  but call sooner if needed  with all medications /inhalers/ solutions in hand so we can verify exactly what you are taking. This includes all medications from all doctors and over the counters  - separate the as needed medications from the ones you use every day

## 2019-05-09 NOTE — Progress Notes (Signed)
  Chronic Care Management   Outreach Note  05/09/2019 Name: Wendy [name] MRN: [id number] DOB: Nov 27, 1955  Referred by: [name], MD Reason for referral : No chief complaint on file.   An unsuccessful telephone outreach was attempted today. The patient was referred to the pharmacist for assistance with care management and care coordination.   This note is not being shared with the patient for the following reason: To respect privacy (The patient or proxy has requested that the information not be shared).  Follow Up Plan:   [name] Scheduler

## 2019-05-09 NOTE — Assessment & Plan Note (Addendum)
Cough since 2016 made worse by acei use d/c'd in 07/2015  Allergy profile 07/23/2016 >  Eos 0.1 /  IgE  15 Rast neg - Sinus CT 08/02/2016 >>> 1. Clear paranasal sinuses. - cyclical cough protocol 07/23/2016 and 08/17/2016 > non adherent - Spirometry 10/15/2016  Wnl/ very min curvature  - repeat cyclical cough protocol 10/15/2016 with Prednisone 10 mg take  4 each am x 2 days,   2 each am x 2 days,  1 each am x 2 days and stop and schedule MCT on gerd rx x 2 weeks then refer to [facility] if still coughing - MCT 10/25/16  >  POS for reversible airlfow obstruction but no assoc symptoms> started singulair / continued 1st gen H1 blockers per guidelines  - 04/18/17 rx pred/ restarted singulair and 1st gen H1 blockers per guidelines  And took tramadol from prior ov and reprots 100% elimination of cough  - 02/2019 relapse cough off all rx for over a year s flare   Of the three most common causes of  Sub-acute / recurrent or chronic cough, only one (GERD)  can actually contribute to/ trigger  the other two (asthma and post nasal drip syndrome)  and perpetuate the cylce of cough.  While not intuitively obvious, many patients with chronic low grade reflux do not cough until there is a primary insult that disturbs the protective epithelial barrier and exposes sensitive nerve endings.   This is typically viral but can due to PNDS and  either may apply here.    >>>  The point is that once this occurs, it is difficult to eliminate the cycle  using anything but a maximally effective acid suppression regimen at least in the short run, accompanied by an appropriate diet to address non acid GERD and control / eliminate the pnds with 1st gen H1 blockers per guidelines and also added 6 days of Prednisone in case of component of Th-2 driven upper or lower airways inflammation (if cough responds short term only to relapse befor return while will on rx for uacs that would point to allergic rhinitis/ asthma or eos bronchitis)  - on the other  hand  if not a lot better then repeat with tramadol but only p pt returns as previously instructed with all meds in hand using a trust but verify approach to confirm accurate Medication  Reconciliation The principal here is that until we are certain that the  patients are doing what we've asked, it makes no sense to ask them to do more.          Each maintenance medication was reviewed in detail including emphasizing most importantly the difference between maintenance and prns and under what circumstances the prns are to be triggered using an action plan format where appropriate.  Total time for H and P, chart review, counseling,  and generating customized AVS unique to this office visit to re establish p > 1 y absence / charting = 30 min

## 2019-05-09 NOTE — Progress Notes (Addendum)
Subjective:     Patient ID: Wendy [name], female   DOB: 1955-12-23,    MRN: [id number]     Brief patient profile:  30 yobf quit smoking 2013 with no resp problems prior then 2016 sudden onset of coughing fit one day  at work in customer service in [employer] and coughed "every day since"   Says sometime later placed on lisinopril and stopped July 2017 because the cough on ACEI was much worse and never improved off it  so referred to pulmonary clinic 07/23/2016 by Wendy [name].     History of Present Illness  07/23/2016 1st [facility] office visit/ Wendy [name]   Chief Complaint  Patient presents with  . Pulmonary Consult    Referred by Wendy [name].  Pt c/o cough for the past year- prod with white sputum.  She states that her cough is "all the time" with no specific trigger.   coughs so hard Pos urinary incont and vomiting Maybe a couple of tbsp total per day mucoid sputum, no better with tessalon / zyrtec, atrovent nasal spray  Has not tried any inhalers  cough present 24/7 and interferes with sleep  rec Stop zyrtec and tessalon and atovent  First take delsym two tsp every 12 hours and supplement if needed with  tramadol 50 mg up to 2 every 4 hours to suppress the urge to cough at all or even clear your throat.  Prednisone 10 mg take  4 each am x 2 days,   2 each am x 2 days,  1 each am x 2 days and stop (this is to eliminate allergies and inflammation from coughing) Protonix (pantoprazole) Take 30-60 min before first meal of the day and Pepcid 20 mg one bedtime plus chlorpheniramine 4 mg x 2 at bedtime (both available over the counter)  until cough is completely gone for at least a week without the need for cough suppression GERD diet Allergy profile 07/23/2016 >  Eos 0.1 /  IgE  15 Rast neg - Sinus CT 08/02/2016 >>> 1. Clear paranasal sinus     08/17/2016  f/u ov/[facility]endy [name] re: refractory cough on ppi qam and zantac bid Chief Complaint  Patient presents with  . Follow-up    Cough not  improving much. She is producing some clear sputum. She states she had such a violent cough a few nights ago, she vomitted. She has occ wheezing.   not compliant with recs, never got the chlorpheniramine and rarely used the tramadol in high enough dose/ freq to eliminate the cough but did admit doing some better at hs while on rx as intended but not has some sense of noct "wheeze" as well Very unsure of meds/ names esp between generic and trade names easily confused  rec For drainage / throat tickle try take CHLORPHENIRAMINE  4 mg - take up to one every 4 hours as needed - available over the counter-  May make you sleepy so only take it when you can afford to be sleepy Take delsym two tsp every 12 hours and supplement if needed with  tramadol 50 mg up to 2 every 4 hours to suppress the urge to cough. .   Continue pantoprazole Take 30-60 min before first meal of the day and Pepcid 20 mg at bedtime  GERD diet   Call if not improved in a week or two for referral to a throat specialist at [facility] ([facility]endy [name])      10/15/2016  f/u ov/[facility]end[name] re: cough  Chief Complaint  Patient presents with  . Acute Visit    Cough is not improving since the last visit. She is coughing until she is vomiting sometimes. Cough is non prod.   never took 2 x h1 at hs  Did improve while on tramadol but did not take as prescribed and got 75% improved until ran out, not clear how much she actually used, did not bring bottles back as req  Did not call for Wendy Wendy [name] eval  Cough to point of vomiting 25/7 now  rec  First take delsym two tsp every 12 hours and supplement if needed with  tramadol 50 mg up to 2 every 4 hours  Once you have eliminated the cough for 3 straight days try reducing the tramadol first,  then the delsym as tolerated.   Prednisone 10 mg take  4 each am x 2 days,   2 each am x 2 days,  1 each am x 2 days and stop (this is to eliminate allergies and inflammation from coughing) Protonix (pantoprazole) Take 30-60  min before first meal of the day and Pepcid 20 mg one bedtime plus chlorpheniramine 4 mg x 2 at bedtime (both available over the counter)  until cough is completely gone for at least a week without the need for cough suppression GERD  Please see patient coordinator before you leave today  to schedule Methacholine challenge test in 2 weeks, not sooner, and if this is negative we will be referring you to [name]    - MCT 10/25/16  >  POS for reversible airlfow obstruction but denied any assoc symptoms   11/22/2016  f/u ov/Juanya [name] re: cough x 2016 / only taking pepcid prn/ stopped ppi  Chief Complaint  Patient presents with  . Follow-up    Coughing less since the last visit. No new co's today.   new med = singulair not convinced it's helped as much the 1st gen  Tends cough to when swallow saliva Overall better than she was  For last 2 years  Not limited by breathing from desired activities   rec GERD diet  Leave off the acid suppression for now  Continue singulair  For now and just take the delsym up to 2 tsp every 12 hours as needed for cough  For drainage / throat tickle try take CHLORPHENIRAMINE  4 mg - take one every 4 hours as needed - available over the counter- may cause drowsiness so start with just a bedtime dose or two and see how you tolerate it before trying in daytime       01/17/2017  f/u ov/[name] re: 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 Follow-up    cough with clear mucus  Singulair 10 mg one daily > resume this as maintenance daily medication  AS NEEDED  >>> For drainage / throat tickle try take CHLORPHENIRAMINE  4 mg - take one every 4 hours as needed - available over the counter- may cause drowsiness so start with just a bedtime dose or two and see how you tolerate it before trying in daytime   Please schedule a follow up visit in 3 months but call sooner if needed  with all medications  /inhalers/ solutions in hand so we can verify exactly what you are taking. This includes all medications from all doctors and over the counters  - separate the as needed  medications from the ones you use every day    04/18/2017  f/u ov/[facility]y [name] re:  Cough variant asthma / did not fill rx for singulair/ pred helps the most / did not bring any meds  Chief Complaint  Patient presents with  . Follow-up    states she still has cough and has not seen much improvement.   Dyspnea:  Not limited by breathing from desired activities  And cough no worse with ex  Cough:  No pattern/ has cough even while sleeping/ non prod Sleep: disrupted by cough  SABA use:  None rec Prednisone 10 mg take  4 each am x 2 days,   2 each am x 2 days,  1 each am x 2 days and stop  Singulair 10 mg one daily > resume this as maintenance daily medication  AS NEEDED  >>> For drainage / throat tickle try take CHLORPHENIRAMINE  4 mg - take one every 4 hours as needed     05/09/2019  Extended f/u ov/[name] re: recurrent cough after gone for more than a year s any meds  Chief Complaint  Patient presents with  . Acute Visit    Cough x 2 months- non prod "all day every day"- keeps her up in the night.   Dyspnea:  Not limited by breathing from desired activities   Cough: dry cough / no relation to eating / no change with temps / best in ams/ worse  lunch time / worst immediately at hs  Sleeping: bed is flat and 2 pillows  SABA use: none 02: none    No obvious day to day or daytime variability or assoc excess/ purulent sputum or mucus plugs or hemoptysis or cp or chest tightness, subjective wheeze or overt sinus or hb symptoms.     Also denies any obvious fluctuation of symptoms with weather or environmental changes or other aggravating or alleviating factors except as outlined above   No unusual exposure hx or h/o childhood pna/ asthma or knowledge of premature birth.  Current Allergies, Complete Past Medical History, Past Surgical  History, Family History, and Social History were reviewed in [employer] record.  ROS  The following are not active complaints unless bolded Hoarseness, sore throat, dysphagia, dental problems, itching, sneezing,  nasal congestion or discharge of excess mucus or purulent secretions, ear ache,   fever, chills, sweats, unintended wt loss or wt gain, classically pleuritic or exertional cp,  orthopnea pnd or arm/hand swelling  or leg swelling, presyncope, palpitations, abdominal pain, anorexia, nausea, vomiting, diarrhea  or change in bowel habits or change in bladder habits, change in stools or change in urine, dysuria, hematuria,  rash, arthralgias, visual complaints, headache, numbness, weakness or ataxia or problems with walking -walks with cane or coordination,  change in mood or  memory.        Current Meds  Medication Sig  . amLODipine (NORVASC) 10 MG tablet TAKE 1 TABLET BY MOUTH ONCE DAILY  . ASPIRIN LOW DOSE 81 MG EC tablet TAKE 1 TABLET BY MOUTH DAILY  . empagliflozin (JARDIANCE) 10 MG TABS tablet Take 10 mg by mouth daily before breakfast.  . levETIRAcetam (KEPPRA) 1000 MG tablet TAKE 1 TABLET BY MOUTH TWICE DAILY  . metFORMIN (GLUCOPHAGE) 1000 MG tablet TAKE 1 TABLET BY MOUTH TWICE DAILY WITH MEALS  . rosuvastatin (CRESTOR) 20 MG tablet TAKE 1 TABLET BY MOUTH ONCE DAILY             Objective:  Physical Exam  amb bf extremely flat affect, never coughed once   05/09/2019        177  04/18/2017         170  01/17/2017         179  11/22/2016     179 10/15/2016       180   08/17/2016        174   07/23/16 177 lb (80.3 kg)  07/20/16 176 lb (79.8 kg)  06/28/16 169 lb 9.6 oz (76.9 kg)    Vital signs reviewed  05/09/2019  - Note at rest 02 sats  98% on RA      HEENT : pt wearing mask not removed for exam due to covid -19 concerns.    NECK :  without JVD/Nodes/TM/ nl carotid upstrokes bilaterally   LUNGS: no acc muscle use,  Nl contour chest which is clear to  A and P bilaterally without cough on insp or exp maneuvers   CV:  RRR  no s3 or murmur or increase in P2, and no edema   ABD:  soft and nontender with nl inspiratory excursion in the supine position. No bruits or organomegaly appreciated, bowel sounds nl  MS:  Walks with cane ext warm without deformities, calf tenderness, cyanosis or clubbing- No obvious joint restrictions   SKIN: warm and dry without lesions    NEURO:  Slowed mentation/ flat affect but  nl sensorium with  no motor or cerebellar deficits apparent.    CXR PA and Lateral:   05/09/2019 :    I personally reviewed images and agree with radiology impression as follows:    No active cardiopulmonary disease.             Assessment:

## 2019-05-10 NOTE — Progress Notes (Signed)
Spoke with pt and notified of results per Dr. Wert. Pt verbalized understanding and denied any questions. 

## 2019-05-15 IMAGING — US US PELVIS COMPLETE TRANSABD/TRANSVAG
1 series · 14 of 25 positions shown · non-contrast
Comparison: None

CLINICAL DATA: Right ovarian cyst.

EXAM:
TRANSABDOMINAL AND TRANSVAGINAL ULTRASOUND OF PELVIS
TECHNIQUE: Both transabdominal and transvaginal ultrasound examinations of the
pelvis were performed. Transabdominal technique was performed for
global imaging of the pelvis including uterus, ovaries, adnexal
regions, and pelvic cul-de-sac. It was necessary to proceed with
endovaginal exam following the transabdominal exam to visualize the
endometrium and ovaries.

[Series 1: us pelvis complete transabd/transvag · 0.19mm/px · 14 of 63 slices shown]
[im 1/63]
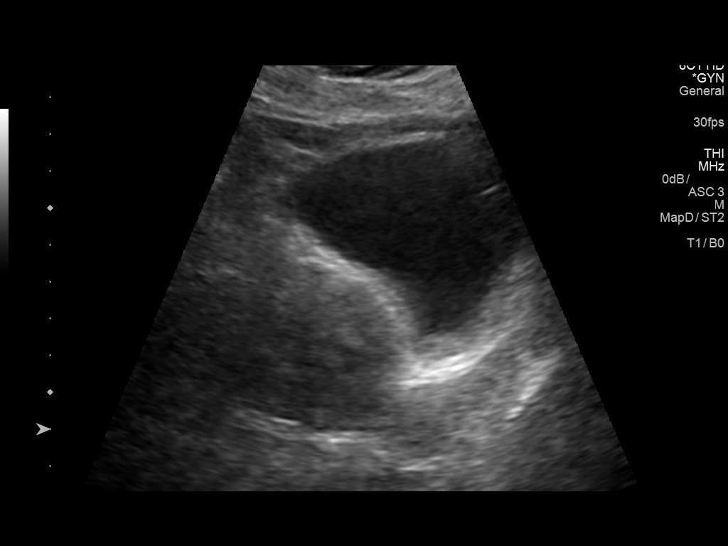
[im 6/63]
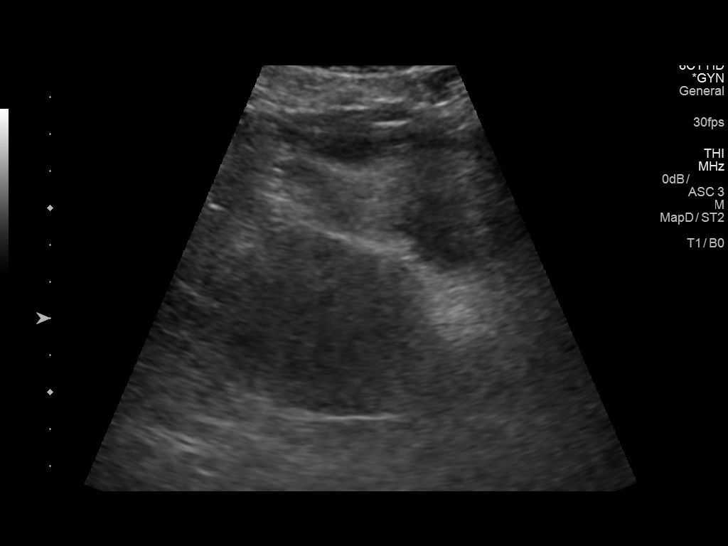
[im 11/63]
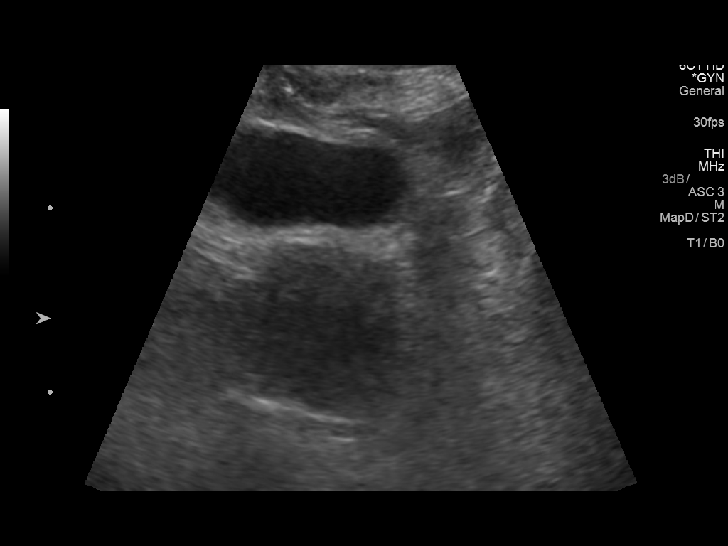
[im 16/63]
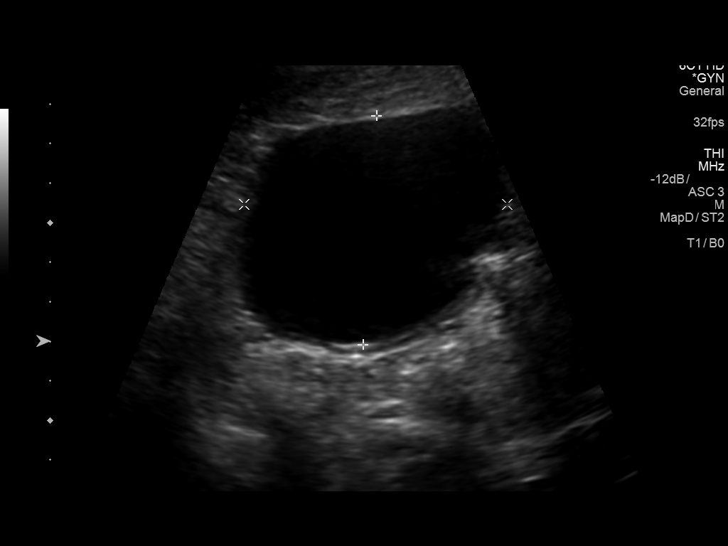
[im 21/63]
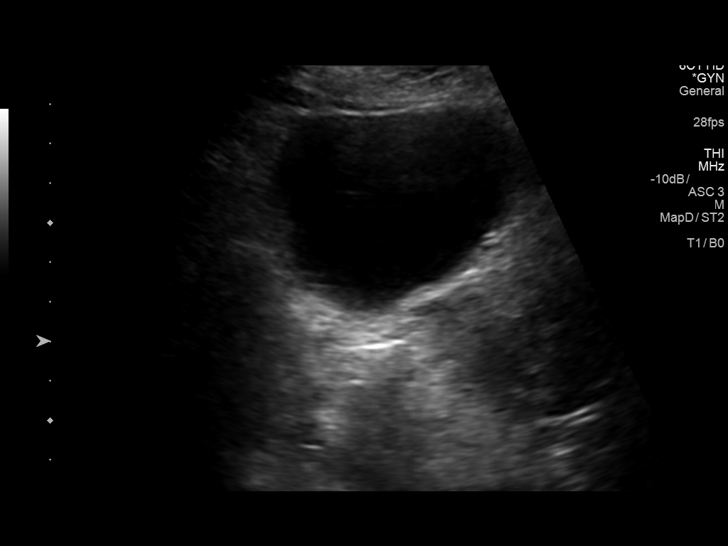
[im 24/63]
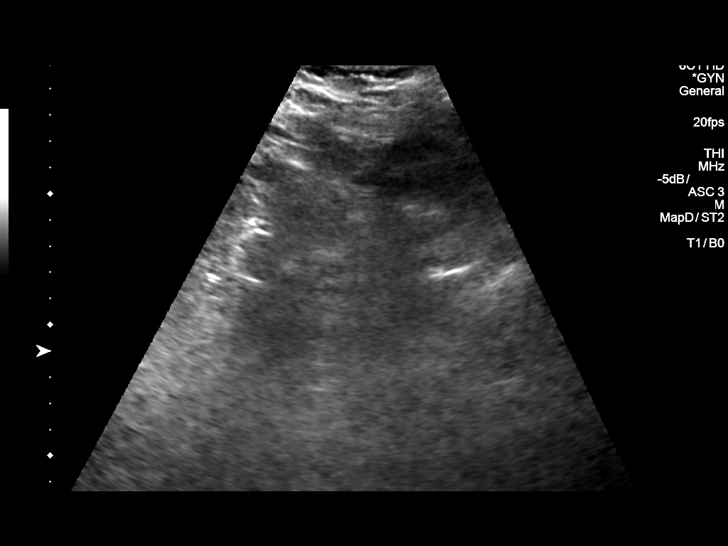
[im 29/63]
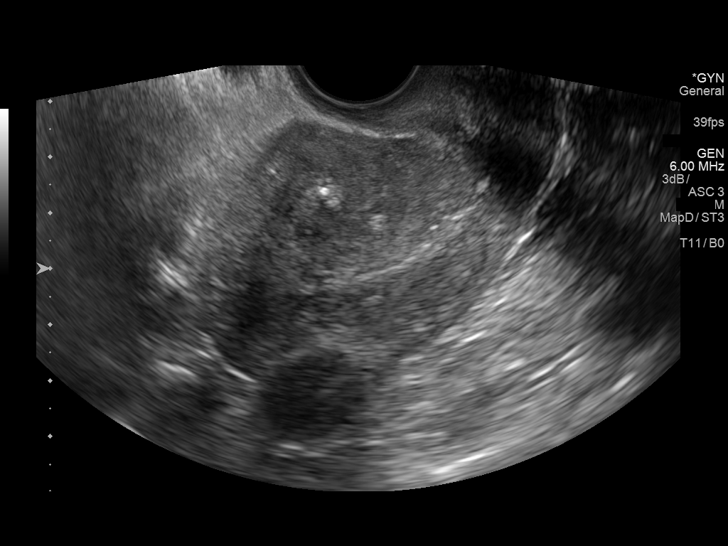
[im 34/63]
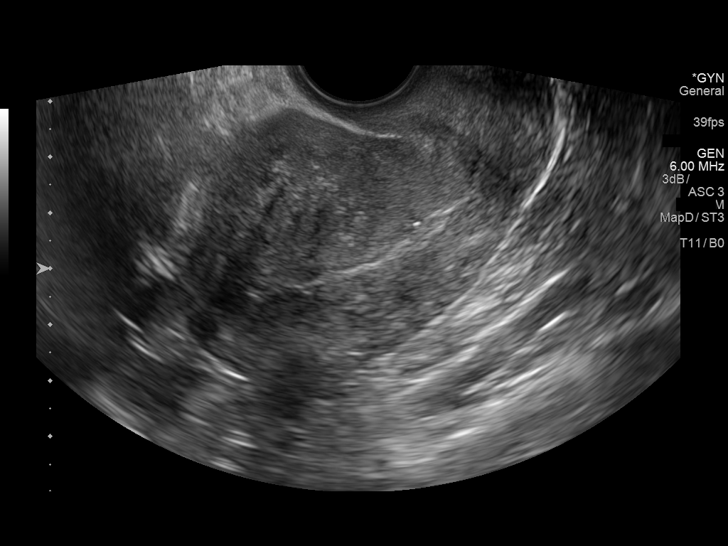
[im 39/63]
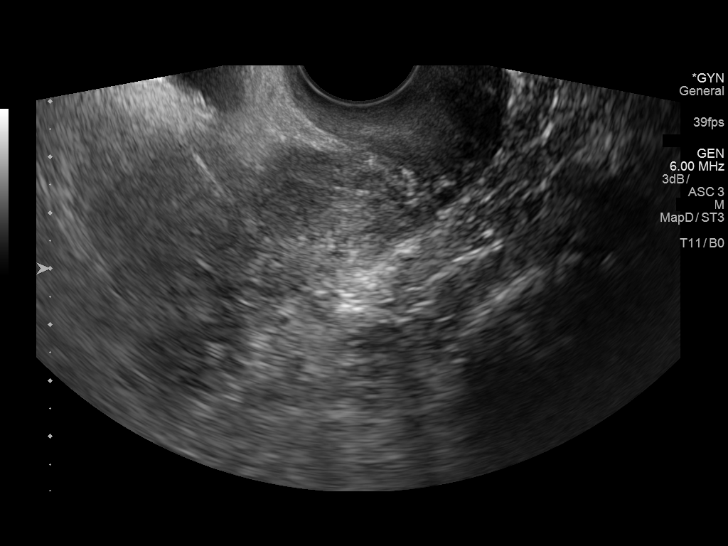
[im 42/63]
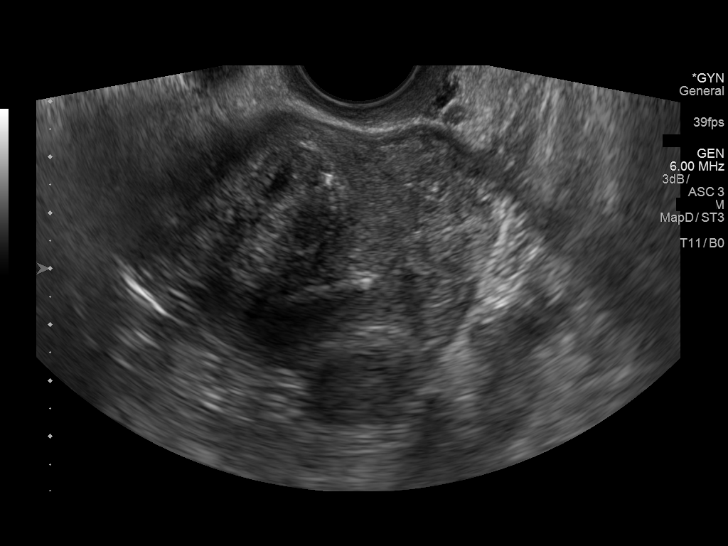
[im 47/63]
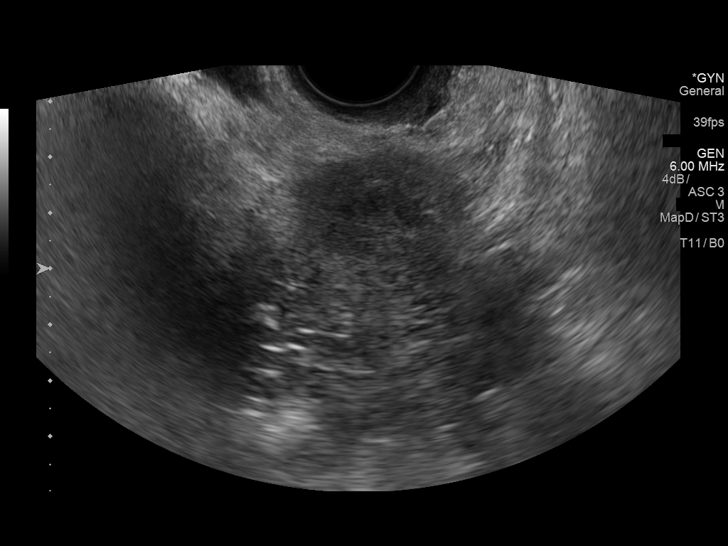
[im 52/63]
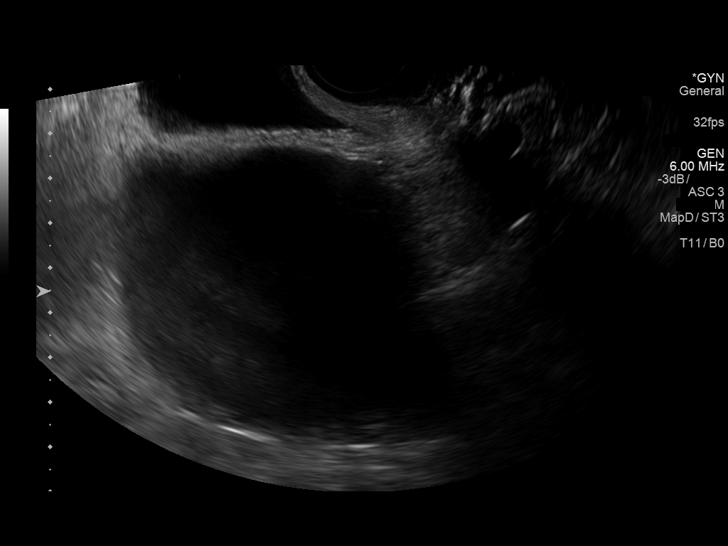
[im 57/63]
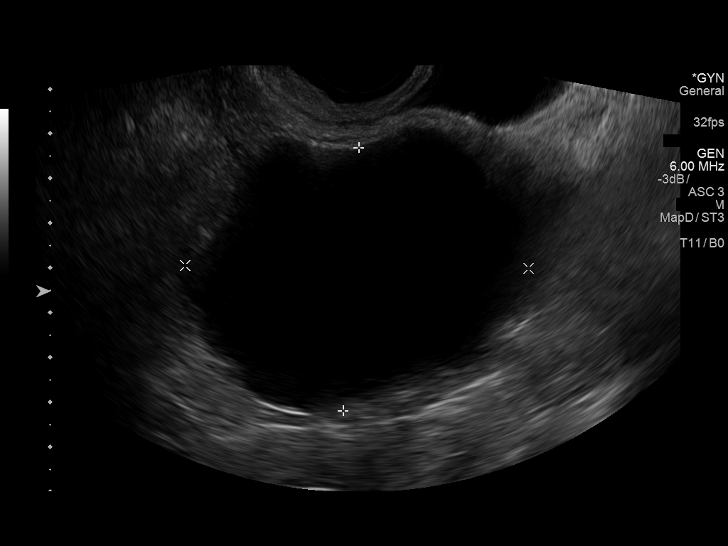
[im 63/63]
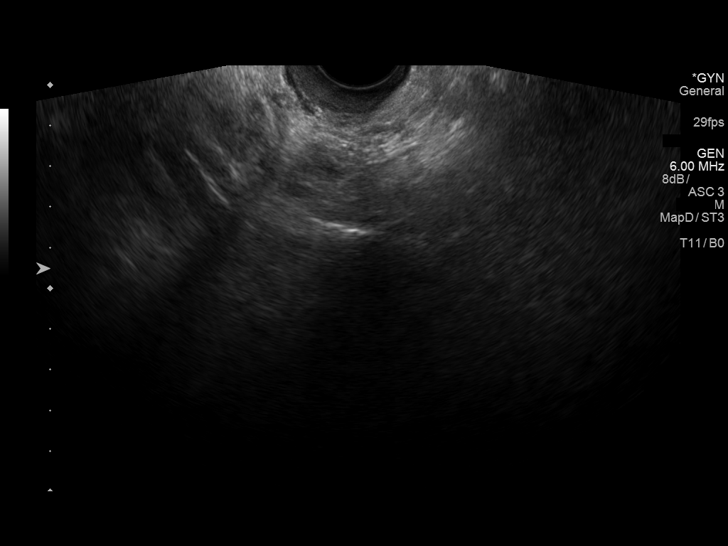

[14 of 25 positions shown; findings below may reference images not displayed]

FINDINGS: Uterus

Measurements: 7.9 x 5.4 x 4.9 cm. 2.8 cm fibroid is noted anteriorly
in the fundus. 1.9 cm fibroid is noted posteriorly.

Endometrium

Thickness: 2 mm which is within normal limits. No focal abnormality
visualized.

Right ovary

Not visualized.  7.7 cm simple right adnexal cyst is noted.

Left ovary

Not visualized.

Other findings

No abnormal free fluid.
IMPRESSION: Ovaries are not visualized. 7.7 cm right adnexal cyst is noted.
Given that the patient is postmenopausal, further evaluation with
MRI is recommended to rule out neoplasm.

Two uterine fibroids are noted.

## 2019-05-16 ENCOUNTER — Telehealth: Payer: Self-pay | Admitting: *Deleted

## 2019-05-16 NOTE — Telephone Encounter (Signed)
Spoke with the patient. She stated that they were coming to get her in 10 minutes.

## 2019-05-16 NOTE — Telephone Encounter (Signed)
The patient came in today  thinking she was having her loop recorder explanted. She has been advised that it was originally scheduled for 05/21/19.  She has been rescheduled for 05/23/19 at 1:30 with Dr. Sallyanne Kuster in the office.   [facility] was called for her to pick her back up. She stated that she was going to get something to eat and then be back.   Call placed back to [facility] to advise them of this and they stated that the patient called them to asked to be picked up around 2-2:30 pm.  A message was left with the patient to verify this information.

## 2019-05-21 ENCOUNTER — Ambulatory Visit: Payer: Medicare Other | Admitting: Cardiovascular Disease

## 2019-05-23 ENCOUNTER — Other Ambulatory Visit: Payer: Self-pay

## 2019-05-23 ENCOUNTER — Ambulatory Visit (INDEPENDENT_AMBULATORY_CARE_PROVIDER_SITE_OTHER): Payer: Medicare Other | Admitting: Cardiovascular Disease

## 2019-05-23 DIAGNOSIS — Z4509 Encounter for adjustment and management of other cardiac device: Secondary | ICD-10-CM

## 2019-05-23 MED ORDER — LIDOCAINE-EPINEPHRINE 1 %-1:100000 IJ SOLN: 10.0000 mL | Freq: Once | INTRAMUSCULAR | Status: AC
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
## 2019-05-23 NOTE — Progress Notes (Signed)
LOOP RECORDER EXPLANT   Procedure report  Procedure performed:  Loop recorder explantation   Reason for procedure:  1. Loop recorder at end of service 2. Cryptogenic stroke Procedure performed by:  Sanda Klein, MD  Complications:  None  Estimated blood loss:  <5 mL  Medications administered during procedure:  Lidocaine 1% with 1/10,000 epinephrine 10 mL locally Device details:  Medtronic Reveal Linq (implanted in 2017 in [place]) Procedure details:  After the risks and benefits of the procedure were discussed the patient provided informed consent. The patient was prepped and draped in usual sterile fashion. Local anesthesia was administered to an area 2 cm to the left of the sternum in the 4th intercostal space. A 2 cm incision was madel. The device was easily located and extracted with a hemostat. Local pressure was held to ensure hemostasis.  The incision was closed with SteriStrips and a sterile dressing was applied.   Sanda Klein, MD, [facility] [phone] office [phone] pager 05/23/2019 1:52 PM

## 2019-05-23 NOTE — Patient Instructions (Addendum)
Discharge Instructions for  Loop Recorder Implant    Follow up: Keep your wound check appointment on 05/29/19 at 2 pm Follow up with Dr. Meda Coffee in September  If you have any questions or concerns, please call the office at [phone].  ACTIVITY No restrictions. DO wear your seatbelt, even if it crosses over the site.   WOUND CARE  Keep the wound area clean and dry.  Remove the dressing the day after (usually 24 hours after the procedure).  DO NOT SUBMERGE UNDER WATER UNTIL FULLY HEALED (no tub baths, hot tubs, swimming pools, etc.).   You  may shower or take a sponge bath after the dressing is removed. DO NOT SOAK the area and do not allow the shower to directly spray on the site.  If you have tape/steri-strips on your wound, these will fall off; do not pull them off prematurely.    No bandage is needed on the site.  DO  NOT apply any creams, oils, or ointments to the wound area.  If you notice any drainage or discharge from the wound, any swelling, excessive redness or bruising at the site, or if you develop a fever > 101? F, call the office at once at [phone].

## 2019-05-24 ENCOUNTER — Encounter: Payer: Medicare Other | Attending: Physical Medicine & Rehabilitation | Admitting: Physical Medicine & Rehabilitation

## 2019-05-24 ENCOUNTER — Ambulatory Visit: Payer: Medicare Other | Admitting: Internal Medicine

## 2019-05-24 ENCOUNTER — Encounter: Payer: Self-pay | Admitting: Physical Medicine & Rehabilitation

## 2019-05-24 ENCOUNTER — Encounter: Payer: Self-pay | Admitting: Internal Medicine

## 2019-05-24 VITALS — BP 130/78 | HR 79 | Temp 97.2°F | Ht 64.0 in | Wt 177.2 lb

## 2019-05-24 DIAGNOSIS — Z8249 Family history of ischemic heart disease and other diseases of the circulatory system: Secondary | ICD-10-CM | POA: Insufficient documentation

## 2019-05-24 DIAGNOSIS — Z87891 Personal history of nicotine dependence: Secondary | ICD-10-CM | POA: Insufficient documentation

## 2019-05-24 DIAGNOSIS — I69354 Hemiplegia and hemiparesis following cerebral infarction affecting left non-dominant side: Secondary | ICD-10-CM | POA: Diagnosis not present

## 2019-05-24 DIAGNOSIS — I10 Essential (primary) hypertension: Secondary | ICD-10-CM | POA: Diagnosis not present

## 2019-05-24 DIAGNOSIS — G8112 Spastic hemiplegia affecting left dominant side: Secondary | ICD-10-CM

## 2019-05-24 DIAGNOSIS — J45991 Cough variant asthma: Secondary | ICD-10-CM

## 2019-05-24 DIAGNOSIS — E119 Type 2 diabetes mellitus without complications: Secondary | ICD-10-CM | POA: Insufficient documentation

## 2019-05-24 DIAGNOSIS — E785 Hyperlipidemia, unspecified: Secondary | ICD-10-CM | POA: Diagnosis not present

## 2019-05-24 DIAGNOSIS — Z800 Family history of malignant neoplasm of digestive organs: Secondary | ICD-10-CM | POA: Insufficient documentation

## 2019-05-24 NOTE — Progress Notes (Signed)
Botox Injection for spasticity using needle EMG guidance  Dilution: 50 Units/ml Indication: Severe spasticity which interferes with ADL,mobility and/or  hygiene and is unresponsive to medication management and other conservative care Informed consent was obtained after describing risks and benefits of the procedure with the patient. This includes bleeding, bruising, infection, excessive weakness, or medication side effects. A REMS form is on file and signed. Needle: 27g 1" needle electrode Number of units per muscle LEFT Biceps75  FDS25 FDP50 FPL50  All injections were done after obtaining appropriate EMG activity and after negative drawback for blood. The patient tolerated the procedure well. Post procedure instructions were given. A followup appointment was made.

## 2019-05-24 NOTE — Patient Instructions (Signed)
Once the protonix (pantoprazole) runs out don't refill it but change pepcid  (famotidine)  to twice daily  - after breakfast and supper    Please schedule a follow up visit in 3 months but call sooner if needed

## 2019-05-24 NOTE — Patient Instructions (Signed)

## 2019-05-24 NOTE — Progress Notes (Signed)
Subjective:     Patient ID: Wendy [name], female   DOB: Aug 06, 1955,    MRN: [id number]     Brief patient profile:  49 yobf quit smoking 2013 with no resp problems prior then 2016 sudden onset of coughing fit one day  at work in customer service in [place] and coughed "every day since"   Says sometime later placed on lisinopril and stopped July 2017 because the cough on ACEI was much worse and never improved off it  so referred to pulmonary clinic 07/23/2016 by Wendy Edilia [name].     History of Present Illness  07/23/2016 1st [facility] office visit/ Wendy [name]   Chief Complaint  Patient presents with  . Pulmonary Consult    Referred by Wendy [name].  Pt c/o cough for the past year- prod with white sputum.  She states that her cough is "all the time" with no specific trigger.   coughs so hard Pos urinary incont and vomiting Maybe a couple of tbsp total per day mucoid sputum, no better with tessalon / zyrtec, atrovent nasal spray  Has not tried any inhalers  cough present 24/7 and interferes with sleep  rec Stop zyrtec and tessalon and atovent  First take delsym two tsp every 12 hours and supplement if needed with  tramadol 50 mg up to 2 every 4 hours to suppress the urge to cough at all or even clear your throat.  Prednisone 10 mg take  4 each am x 2 days,   2 each am x 2 days,  1 each am x 2 days and stop (this is to eliminate allergies and inflammation from coughing) Protonix (pantoprazole) Take 30-60 min before first meal of the day and Pepcid 20 mg one bedtime plus chlorpheniramine 4 mg x 2 at bedtime (both available over the counter)  until cough is completely gone for at least a week without the need for cough suppression GERD diet Allergy profile 07/23/2016 >  Eos 0.1 /  IgE  15 Rast neg - Sinus CT 08/02/2016 >>> 1. Clear paranasal sinus     08/17/2016  f/u ov/Wendy [name] re: refractory cough on ppi qam and zantac bid Chief Complaint  Patient presents with  . Follow-up    Cough not  improving much. She is producing some clear sputum. She states she had such a violent cough a few nights ago, she vomitted. She has occ wheezing.   not compliant with recs, never got the chlorpheniramine and rarely used the tramadol in high enough dose/ freq to eliminate the cough but did admit doing some better at hs while on rx as intended but not has some sense of noct "wheeze" as well Very unsure of meds/ names esp between generic and trade names easily confused  rec For drainage / throat tickle try take CHLORPHENIRAMINE  4 mg - take up to one every 4 hours as needed - available over the counter-  May make you sleepy so only take it when you can afford to be sleepy Take delsym two tsp every 12 hours and supplement if needed with  tramadol 50 mg up to 2 every 4 hours to suppress the urge to cough. .   Continue pantoprazole Take 30-60 min before first meal of the day and Pepcid 20 mg at bedtime  GERD diet   Call if not improved in a week or two for referral to a throat specialist at [place] (Wendy [name])      10/15/2016  f/u ov/Wendy [name] re: cough  Chief Complaint  Patient presents with  . Acute Visit    Cough is not improving since the last visit. She is coughing until she is vomiting sometimes. Cough is non prod.   never took 2 x h1 at hs  Did improve while on tramadol but did not take as prescribed and got 75% improved until ran out, not clear how much she actually used, did not bring bottles back as req  Did not call for Wendy [name] eval  Cough to point of vomiting 25/7 now  rec  First take delsym two tsp every 12 hours and supplement if needed with  tramadol 50 mg up to 2 every 4 hours  Once you have eliminated the cough for 3 straight days try reducing the tramadol first,  then the delsym as tolerated.   Prednisone 10 mg take  4 each am x 2 days,   2 each am x 2 days,  1 each am x 2 days and stop (this is to eliminate allergies and inflammation from coughing) Protonix (pantoprazole) Take 30-60  min before first meal of the day and Pepcid 20 mg one bedtime plus chlorpheniramine 4 mg x 2 at bedtime (both available over the counter)  until cough is completely gone for at least a week without the need for cough suppression GERD  Please see patient coordinator before you leave today  to schedule Methacholine challenge test in 2 weeks, not sooner, and if this is negative we will be referring you to Wendy [name] at [facility]    - MCT 10/25/16  >  POS for reversible airlfow obstruction but denied any assoc symptoms   11/22/2016  f/u ov/Wendy [name] re: cough x 2016 / only taking pepcid prn/ stopped ppi  Chief Complaint  Patient presents with  . Follow-up    Coughing less since the last visit. No new co's today.   new med = singulair not convinced it's helped as much the 1st gen  Tends cough to when swallow saliva Overall better than she was  For last 2 years  Not limited by breathing from desired activities   rec GERD diet  Leave off the acid suppression for now  Continue singulair  For now and just take the delsym up to 2 tsp every 12 hours as needed for cough  For drainage / throat tickle try take CHLORPHENIRAMINE  4 mg - take one every 4 hours as needed - available over the counter- may cause drowsiness so start with just a bedtime dose or two and see how you tolerate it before trying in daytime       01/17/2017  f/u ov/Wendy [name] re: 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 Follow-up    cough with clear mucus  Singulair 10 mg one daily > resume this as maintenance daily medication  AS NEEDED  >>> For drainage / throat tickle try take CHLORPHENIRAMINE  4 mg - take one every 4 hours as needed - available over the counter- may cause drowsiness so start with just a bedtime dose or two and see how you tolerate it before trying in daytime   Please schedule a follow up visit in 3 months but call sooner if needed  with all medications  /inhalers/ solutions in hand so we can verify exactly what you are taking. This includes all medications from all doctors and over the counters  - separate the as needed  medications from the ones you use every day    04/18/2017  f/u ov/Beyonca Wisz re:  Cough variant asthma / did not fill rx for singulair/ pred helps the most / did not bring any meds  Chief Complaint  Patient presents with  . Follow-up    states she still has cough and has not seen much improvement.   Dyspnea:  Not limited by breathing from desired activities  And cough no worse with ex  Cough:  No pattern/ has cough even while sleeping/ non prod Sleep: disrupted by cough  SABA use:  None rec Prednisone 10 mg take  4 each am x 2 days,   2 each am x 2 days,  1 each am x 2 days and stop  Singulair 10 mg one daily > resume this as maintenance daily medication  AS NEEDED  >>> For drainage / throat tickle try take CHLORPHENIRAMINE  4 mg - take one every 4 hours as needed     05/09/2019  Extended f/u ov/Britni Driscoll re: recurrent cough after gone for more than a year s any meds  Chief Complaint  Patient presents with  . Acute Visit    Cough x 2 months- non prod "all day every day"- keeps her up in the night.   Dyspnea:  Not limited by breathing from desired activities   Cough: dry cough / no relation to eating / no change with temps / best in ams/ worse  lunch time / worst immediately at hs  Sleeping: bed is flat and 2 pillows  SABA use: none 02: none  Rec Prednisone 10 mg take  4 each am x 2 days,  2 each am x 2 days,  1 each am x 2 days and stop (this is to eliminate allergies and inflammation from coughing) Protonix (pantoprazole) Take 30-60 min before first meal of the day and Pepcid 20 mg one bedtime plus chlortabs chlorpheniramine 4 mg x 2  One hour before  bedtime (both available over the counter)  until cough is completely gone for at least a week without the need for cough suppression Singulair 10 mg one daily > resume this as  maintenance daily medication  Please schedule a follow up visit in  2 weeks  but call sooner if needed  with all medications   05/24/2019  f/u ov/Kamylah Manzo re: cough variant asthma  ? With uacs / better back on singulair and gerd rx  Chief Complaint  Patient presents with  . Follow-up    pt states cough but states it has improved. pt states finish predinose  Dyspnea:  Not limited by breathing from desired activities / low back limiting   Cough: gone  Sleeping: flat bed 2 pillows  SABA use: none  02: none   No obvious day to day or daytime variability or assoc excess/ purulent sputum or mucus plugs or hemoptysis or cp or chest tightness, subjective wheeze or overt sinus or hb symptoms.   Sleeping  without nocturnal  or early am exacerbation  of respiratory  c/o's or need for noct saba. Also denies any obvious fluctuation of symptoms with weather or environmental changes or other aggravating or alleviating factors except as outlined above   No unusual exposure hx or h/o childhood pna/ asthma or knowledge of premature birth.  Current Allergies, Complete Past Medical History, Past Surgical History, Family History, and Social History were reviewed in [employer] record.  ROS  The following are not active complaints unless bolded Hoarseness,  sore throat, dysphagia, dental problems, itching, sneezing,  nasal congestion or discharge of excess mucus or purulent secretions, ear ache,   fever, chills, sweats, unintended wt loss or wt gain, classically pleuritic or exertional cp,  orthopnea pnd or arm/hand swelling  or leg swelling, presyncope, palpitations, abdominal pain, anorexia, nausea, vomiting, diarrhea  or change in bowel habits or change in bladder habits, change in stools or change in urine, dysuria, hematuria,  rash, arthralgias, visual complaints, headache, numbness, weakness or ataxia or problems with walking or coordination,  change in mood or  memory.        Current  Meds  Medication Sig  . amLODipine (NORVASC) 10 MG tablet TAKE 1 TABLET BY MOUTH ONCE DAILY  . ASPIRIN LOW DOSE 81 MG EC tablet TAKE 1 TABLET BY MOUTH DAILY  . empagliflozin (JARDIANCE) 10 MG TABS tablet Take 10 mg by mouth daily before breakfast.  . famotidine (PEPCID) 20 MG tablet One after supper  . levETIRAcetam (KEPPRA) 1000 MG tablet TAKE 1 TABLET BY MOUTH TWICE DAILY  . metFORMIN (GLUCOPHAGE) 1000 MG tablet TAKE 1 TABLET BY MOUTH TWICE DAILY WITH MEALS  . montelukast (SINGULAIR) 10 MG tablet Take 1 tablet (10 mg total) by mouth at bedtime.  . pantoprazole (PROTONIX) 40 MG tablet Take 1 tablet (40 mg total) by mouth daily. Take 30-60 min before first meal of the day  . rosuvastatin (CRESTOR) 20 MG tablet TAKE 1 TABLET BY MOUTH ONCE DAILY           Objective:  Physical Exam   amb somber bf nad  05/24/2019        177  05/09/2019        177  04/18/2017         170  01/17/2017         179  11/22/2016     179 10/15/2016       180   08/17/2016        174   07/23/16 177 lb (80.3 kg)  07/20/16 176 lb (79.8 kg)  06/28/16 169 lb 9.6 oz (76.9 kg)    Vital signs reviewed  05/24/2019  - Note at rest 02 sats  96% on RA     HEENT : pt wearing mask not removed for exam due to covid -19 concerns.    NECK :  without JVD/Nodes/TM/ nl carotid upstrokes bilaterally   LUNGS: no acc muscle use,  Nl contour chest which is clear to A and P bilaterally without cough on insp or exp maneuvers   CV:  RRR  no s3 or murmur or increase in P2, and no edema   ABD:  soft and nontender with nl inspiratory excursion in the supine position. No bruits or organomegaly appreciated, bowel sounds nl  MS: walk with cane/ ext warm without deformities, calf tenderness, cyanosis or clubbing No obvious joint restrictions   SKIN: warm and dry without lesions    NEURO:  Very flat affect , nl sensorium with  no motor or cerebellar deficits apparent.                 Assessment:

## 2019-05-24 NOTE — Assessment & Plan Note (Addendum)
Cough since 2016 made worse by acei use d/c'd in 07/2015  Allergy profile 07/23/2016 >  Eos 0.1 /  IgE  15 Rast neg - Sinus CT 08/02/2016 > Clear paranasal sinuses. - cyclical cough protocol 07/23/2016 and 08/17/2016 > non adherent - Spirometry 10/15/2016  Wnl/ very min curvature  - repeat cyclical cough protocol 10/15/2016 with Prednisone 10 mg take  4 each am x 2 days,   2 each am x 2 days,  1 each am x 2 days and stop and schedule MCT on gerd rx x 2 weeks then refer to [employer] if still coughing - MCT 10/25/16  >  POS for reversible airlfow obstruction s symptoms> started singulair / continued 1st gen H1 blockers per guidelines   - 04/18/17 rx pred/ restarted singulair and 1st gen H1 blockers per guidelines  And took tramadol from prior ov and reprots 100% elimination of cough  - 02/2019 relapse cough off all rx for over a year s flare > restarted singulair/ gerd rx 05/09/19 plus 6 d prednisone   Marked improvement p last rx with ? Will she sustain it    rec Continue singulair x 3 m plus reduce gerd rx to h2 bid to see if flares off ppi F/u @ 3 m         Each maintenance medication was reviewed in detail including emphasizing most importantly the difference between maintenance and prns and under what circumstances the prns are to be triggered using an action plan format where appropriate.  Total time for H and P, chart review, counseling,   and generating customized AVS unique to this office visit / charting = 20 min

## 2019-05-29 ENCOUNTER — Ambulatory Visit (INDEPENDENT_AMBULATORY_CARE_PROVIDER_SITE_OTHER): Payer: Medicare Other | Admitting: Emergency Medicine

## 2019-05-29 ENCOUNTER — Other Ambulatory Visit: Payer: Self-pay

## 2019-05-29 ENCOUNTER — Other Ambulatory Visit: Payer: Self-pay | Admitting: Neurology

## 2019-05-29 DIAGNOSIS — I639 Cerebral infarction, unspecified: Secondary | ICD-10-CM

## 2019-05-29 DIAGNOSIS — G40209 Localization-related (focal) (partial) symptomatic epilepsy and epileptic syndromes with complex partial seizures, not intractable, without status epilepticus: Secondary | ICD-10-CM
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
## 2019-06-21 ENCOUNTER — Telehealth: Payer: Self-pay | Admitting: Family Medicine

## 2019-06-21 DIAGNOSIS — I10 Essential (primary) hypertension: Secondary | ICD-10-CM

## 2019-06-21 DIAGNOSIS — I700 Atherosclerosis of aorta: Secondary | ICD-10-CM

## 2019-06-21 DIAGNOSIS — E0865 Diabetes mellitus due to underlying condition with hyperglycemia: Secondary | ICD-10-CM

## 2019-06-21 NOTE — Progress Notes (Signed)
  Chronic Care Management   Note  06/21/2019 Name: Wendy [name] MRN: [id number] DOB: Sep 17, 1955  Wendy [name] is a 64 y.o. year old female who is a primary care patient of [name], MD. I reached out to [employer] by phone today in response to a referral sent by [name] PCP, [name], MD.   [name] was given information about Chronic Care Management services today including:  1. CCM service includes personalized support from designated clinical staff supervised by her physician, including individualized plan of care and coordination with other care providers 2. 24/7 contact phone numbers for assistance for urgent and routine care needs. 3. Service will only be billed when office clinical staff spend 20 minutes or more in a month to coordinate care. 4. Only one practitioner may furnish and bill the service in a calendar month. 5. The patient may stop CCM services at any time (effective at the end of the month) by phone call to the office staff.   Patient agreed to services and verbal consent obtained.   This note is not being shared with the patient for the following reason: To respect privacy (The patient or proxy has requested that the information not be shared).  Follow up plan:   [name] Upstream Scheduler

## 2019-06-22 NOTE — Progress Notes (Signed)
ERROR This note is not being shared with the patient for the following reason: To respect privacy (The patient or proxy has requested that the information not be shared).  [name] Upstream Scheduler

## 2019-06-28 ENCOUNTER — Telehealth: Payer: Self-pay | Admitting: Neurology
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
## 2019-06-28 NOTE — Telephone Encounter (Signed)
Please place patient on waiting list per Dr.Aquino, patient notified.

## 2019-06-28 NOTE — Telephone Encounter (Signed)
We will discuss on follow-up appt, can put her on waitlist, will need in-person visit. Thanks

## 2019-06-28 NOTE — Telephone Encounter (Signed)
Patient is on the wait list.

## 2019-06-28 NOTE — Telephone Encounter (Signed)
Please advise 

## 2019-07-05 ENCOUNTER — Other Ambulatory Visit: Payer: Self-pay

## 2019-07-05 ENCOUNTER — Encounter: Payer: Medicare Other | Attending: Physical Medicine & Rehabilitation | Admitting: Physical Medicine & Rehabilitation

## 2019-07-05 ENCOUNTER — Encounter: Payer: Self-pay | Admitting: Physical Medicine & Rehabilitation

## 2019-07-05 VITALS — HR 82 | Temp 97.6°F | Ht 64.0 in | Wt 175.2 lb

## 2019-07-05 DIAGNOSIS — I10 Essential (primary) hypertension: Secondary | ICD-10-CM | POA: Diagnosis not present

## 2019-07-05 DIAGNOSIS — E785 Hyperlipidemia, unspecified: Secondary | ICD-10-CM | POA: Insufficient documentation

## 2019-07-05 DIAGNOSIS — Z8249 Family history of ischemic heart disease and other diseases of the circulatory system: Secondary | ICD-10-CM | POA: Diagnosis not present

## 2019-07-05 DIAGNOSIS — Z800 Family history of malignant neoplasm of digestive organs: Secondary | ICD-10-CM | POA: Insufficient documentation

## 2019-07-05 DIAGNOSIS — I69354 Hemiplegia and hemiparesis following cerebral infarction affecting left non-dominant side: Secondary | ICD-10-CM | POA: Diagnosis not present

## 2019-07-05 DIAGNOSIS — G8112 Spastic hemiplegia affecting left dominant side: Secondary | ICD-10-CM | POA: Diagnosis not present

## 2019-07-05 DIAGNOSIS — E119 Type 2 diabetes mellitus without complications: Secondary | ICD-10-CM | POA: Insufficient documentation

## 2019-07-05 DIAGNOSIS — G8929 Other chronic pain: Secondary | ICD-10-CM | POA: Diagnosis not present

## 2019-07-05 DIAGNOSIS — M533 Sacrococcygeal disorders, not elsewhere classified: Secondary | ICD-10-CM

## 2019-07-05 DIAGNOSIS — Z87891 Personal history of nicotine dependence: Secondary | ICD-10-CM | POA: Diagnosis not present

## 2019-07-05 NOTE — Patient Instructions (Signed)
Sacroiliac injections may be needed 1-2 times per year for left pelvis pain
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
## 2019-07-09 ENCOUNTER — Telehealth: Payer: Self-pay | Admitting: Neurology

## 2019-07-09 ENCOUNTER — Telehealth: Payer: Self-pay | Admitting: Internal Medicine

## 2019-07-09 NOTE — Telephone Encounter (Signed)
Pt called back. Informed her that she would need to sched an appt. First available in [place] is 07/27/19 w/ np. Pt would like to know what can be done about her cough until then. Pleas advise.

## 2019-07-09 NOTE — Telephone Encounter (Signed)
Called patient and had to leave a VM.  Offered a virtual visit with Sarah on 07/13/19 at 2:30 pm or 3:00pm.  Requested that she return my call to schedule.

## 2019-07-09 NOTE — Telephone Encounter (Signed)
AccessNurse 07/07/19 @ 1:14PM:  "Caller states, missed call from this #. Declined rn."

## 2019-07-09 NOTE — Telephone Encounter (Signed)
Spoke with patient, states her cough is no better after completing the Prednisone and Pepcid.  She has a nonproductive cough during the day and at night that causing bladder leakage.  States it is a very deep cough.  She is requesting Tramadol. Dr. Melvyn Novas, please advise.

## 2019-07-09 NOTE — Telephone Encounter (Signed)
Sorry to hear that but will need to be seen with all meds in hand asap and if can't be done in person in [place] with np or [place] with me  try televisit with me any day this week

## 2019-07-09 NOTE — Telephone Encounter (Signed)
Left VM for patient to return call to schedule visit asap with her medications either in [place] with a NP or [place] with Dr. Melvyn Novas.  Televisit with Dr. Melvyn Novas any day this week per Dr. Melvyn Novas.

## 2019-07-11 NOTE — Telephone Encounter (Signed)
LMTCB x2 for pt 

## 2019-07-12 NOTE — Telephone Encounter (Signed)
LMTCB x3 for pt. We have attempted to contact pt several times with no success or call back from pt. Per triage protocol, message will be closed.   

## 2019-07-13 NOTE — Addendum Note (Signed)
Addended by: Lynda Rainwater on: 07/13/2019 04:29 PM   Modules accepted: Orders

## 2019-07-19 NOTE — Chronic Care Management (AMB) (Signed)
Chronic Care Management Pharmacy  Name: Wendy [name]  MRN: [id number] DOB: Oct 29, 1955   Chief Complaint/ HPI  Wendy [name],  64 y.o. , female presents for their Initial CCM visit with the clinical pharmacist via telephone due to COVID-19 Pandemic.  PCP : [name], MD Patient Care Team: [name], MD as PCP - General (Family Medicine) Cameron Sprang, MD as Consulting Physician (Neurology) [name], [facility] as Pharmacist (Pharmacist)  Their chronic conditions include: Hypertension, Hyperlipidemia, Diabetes, Coronary Artery Disease, Asthma and history of seizures   Office Visits: 09/14/18: patient presented to Dr. Ethelene Hal for follow-up.   Consult Visit: 07/05/19: Patient presented to Dr. Letta Pate (physical medicine) for follow-up.  05/24/19: Patient presented to Dr. Melvyn Novas (GI) for chronic cough. Patient instructed to stop zyrtec, tessalon and atovent, start delsym, tramadol, prednisone, pantoprazole, famotidine and chlorpheniramine 05/23/19: Patient presented to Dr. Sallyanne Kuster (Cardiology) for loop recorder explant. 05/09/19: Patient presented to Dr. Melvyn Novas (pulmonolgy) for chronic cough. Patient started on famotidine, montelukast, pantoprazole, prednisone.   Patient states she is doing well today, but is still struggling to deal with a chronic cough that has not improved since her recent visit with Dr. Melvyn Novas. She lives alone, although her brother does live in the [place] area. 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 She is unable to drive and uses public transportation ([employer] bus service) to get to [place] appointments.   Allergies  Allergen Reactions  . Hydrocodone-Ibuprofen     Unknown to pt  . Lisinopril Cough  . Vicodin [Hydrocodone-Acetaminophen] Palpitations    Pt states she can take tylenol    Medications: Outpatient Encounter Medications as of 07/20/2019  Medication Sig  . amLODipine (NORVASC) 10 MG tablet TAKE  1 TABLET BY MOUTH ONCE DAILY  . ASPIRIN LOW DOSE 81 MG EC tablet TAKE 1 TABLET BY MOUTH DAILY  . levETIRAcetam (KEPPRA) 1000 MG tablet TAKE 1 TABLET BY MOUTH TWICE DAILY *PATIENT NEEDS APPOINTMENT FOR REFILLS*  . metFORMIN (GLUCOPHAGE) 1000 MG tablet TAKE 1 TABLET BY MOUTH TWICE DAILY WITH MEALS  . montelukast (SINGULAIR) 10 MG tablet Take 1 tablet (10 mg total) by mouth at bedtime.  . pantoprazole (PROTONIX) 40 MG tablet Take 1 tablet (40 mg total) by mouth daily. Take 30-60 min before first meal of the day  . rosuvastatin (CRESTOR) 20 MG tablet TAKE 1 TABLET BY MOUTH ONCE DAILY  . empagliflozin (JARDIANCE) 10 MG TABS tablet Take 10 mg by mouth daily before breakfast. (Patient not taking: Reported on 07/20/2019)  . famotidine (PEPCID) 20 MG tablet One after supper (Patient not taking: Reported on 07/05/2019)   Facility-Administered Encounter Medications as of 07/20/2019  Medication  . lidocaine-EPINEPHrine (XYLOCAINE W/EPI) 1 %-1:100000 (with pres) injection 10 mL   Current Diagnosis/Assessment:  SDOH Interventions     Most Recent Value  SDOH Interventions  Financial Strain Interventions Other (Comment)  [Patient referred to [facility] counselor for LIS program application]  Transportation Interventions Intervention Not Indicated     Goals Addressed            This Visit's Progress   . Chronic Care Management       CARE PLAN ENTRY (see longitudinal plan of care for additional care plan information)  Current Barriers:  . Chronic Disease Management support, education, and care coordination needs related to Hypertension, Hyperlipidemia, Diabetes, Coronary Artery Disease, and History of Seizures   Hypertension BP Readings from Last 3 Encounters:  05/24/19 128/86  05/24/19 130/78  05/09/19 112/68   . Pharmacist Clinical Goal(s): o Over the next 90 days, patient will work with PharmD and providers to maintain BP goal <130/80 . Current regimen:  o Amlodipine 10 mg daily . Patient self  care activities - Over the next 90 days, patient will: o Ensure daily salt intake < 2300 mg/day  Hyperlipidemia Lab Results  Component Value Date/Time   LDLCALC 100 (H) 09/14/2018 01:57 PM   LDLDIRECT 122.0 09/14/2018 01:57 PM   . Pharmacist Clinical Goal(s): o Over the next 90 days, patient will work with PharmD and providers to achieve LDL goal < 70 . Current regimen:  o Rosuvastatin 20 mg daily . Interventions: o Recommend increasing rosuvastatin to 40 mg daily  o Recommend rechecking lipid panel in one month  . Patient self care activities - Over the next 90 days, patient will: o Limit red meat, processed meats, and fatty or greasy foods   Diabetes Lab Results  Component Value Date/Time   HGBA1C 8.7 (H) 09/14/2018 01:57 PM   HGBA1C 9.5 (H) 06/22/2018 10:05 AM   . Pharmacist Clinical Goal(s): o Over the next 90 days, patient will work with PharmD and providers to achieve A1c goal <7% . Current regimen:  . Metformin 1000 mg twice daily  . Jardiance 10 mg daily . Interventions: o Recommend increasing Jardiance to 25 mg daily  o Recommend using a continuous glucose monitor that you wear on your arm to help check your blood sugars  . Patient self care activities - Over the next 90 days, patient will: o Contact provider with any episodes of hypoglycemia  Medication management . Pharmacist Clinical Goal(s): o Over the next 90 days, patient will work with PharmD and providers to maintain optimal medication adherence . Current pharmacy: [facility] . Interventions o Comprehensive medication review performed. o Utilize [facility] for medication synchronization, packaging and delivery o Verbal consent obtained for [facility] enhanced pharmacy services (medication synchronization, adherence packaging, delivery coordination). A medication sync plan was created to allow patient to get all medications delivered once every 30 to 90 days per patient preference.  Patient understands they have freedom to choose pharmacy and clinical pharmacist will coordinate care between all prescribers and [facility]. . Patient self care activities - Over the next 90 days, patient will: o Reach out to the [facility] center to reach out to see if you qualify for the Extra Help Program. You can reach them at [phone].  o Take medications as prescribed o Report any questions or concerns to PharmD and/or provider(s)      Diabetes   A1c goal <7%  Recent Relevant Labs: Lab Results  Component Value Date/Time   HGBA1C 8.7 (H) 09/14/2018 01:57 PM   HGBA1C 9.5 (H) 06/22/2018 10:05 AM   GFR 89.65 09/14/2018 01:57 PM   GFR 92.45 06/22/2018 10:05 AM   MICROALBUR 2.0 (H) 06/22/2018 10:05 AM   MICROALBUR 0.9 11/29/2016 03:30 PM    Last diabetic Eye exam: No results found for: HMDIABEYEEXA  Last diabetic Foot exam: No results found for: HMDIABFOOTEX   Checking BG: Never. Patient unable to use glucometer to check blood sugars.  Recent FBG Readings: n/a Recent pre-meal BG readings: n/a Recent 2hr PP BG readings:  n/a Recent HS BG readings: n/a  Patient has failed these meds in past: n/a Patient is currently controlled on the following medications: Marland Kitchen Metformin 1000 mg BID  . Jardiance 10 mg daily (often forgets to take)   We discussed: Often forgets to  take her Jardiance as she kept it separate from her other medications. She states it is very expensive for her. She has not filled Jardiance in 2021, but last time she filled the medicine it cost her $136 (unsure if 30 or 90 day supply). Denies side effects from [place].    Diet: Drinks a lot of fruit smoothies. Ocassionally eats salads, spagetti with ground [place], sandwich wraps. Sometimes will look at nutrition labels. She does state she goes out to eat often because it is difficult for her to cook often.   Exercise: does not get much formal exercise due to limitations from her stroke.   Plan  Recommend  follow-up A1c  Recommend Freestyle [employer]re CGM to aid patient with home glucose monitoring Will see if patient qualifies for LIS program, before adjusting medication regimen. Continue current medications  Hypertension   BP goal is:  <130/80  Office blood pressures are  BP Readings from Last 3 Encounters:  05/24/19 128/86  05/24/19 130/78  05/09/19 112/68   Patient checks BP at home never Patient home BP readings are ranging: n/a  Patient has failed these meds in the past: n/a Patient is currently controlled on the following medications:  . Amlodipine 10 mg daily  We discussed diet and exercise extensively. When cooking she does salt food to taste. Denies symptoms of hypotension or side effects from amlodipine. Has never owned a blood pressure monitor and was unsure if she would be able to use one at home.    Plan  Continue current medications   Hyperlipidemia   History of CVA 2017 (left sided deficits)  LDL goal < 70  Lipid Panel     Component Value Date/Time   CHOL 195 09/14/2018 1357   TRIG 168.0 (H) 09/14/2018 1357   HDL 62.10 09/14/2018 1357   LDLCALC 100 (H) 09/14/2018 1357   LDLDIRECT 122.0 09/14/2018 1357    Hepatic Function Latest Ref Rng & Units 09/20/2018 09/14/2018 06/22/2018  Total Protein 6.5 - 8.1 g/dL 7.7 7.0 6.9  Albumin 3.5 - 5.0 g/dL 4.4 4.2 4.4  AST 15 - 41 U/L _0 ALT 0 - 44 U/L _1 Alk Phosphatase 38 - 126 U/L 131(H) 124(H) 168(H)  Total Bilirubin 0.3 - 1.2 mg/dL 0.5 0.4 0.5  Bilirubin, Direct 0.0 - 0.3 mg/dL - - -     The ASCVD Risk score ([place]., et al., 2013) failed to calculate for the following reasons:   The patient has a prior MI or stroke diagnosis   Patient has failed these meds in past: n/a Patient is currently uncontrolled on the following medications:  . Aspirin 81 mg daily  . Rosuvastatin 20 mg daily   We discussed:  diet and exercise extensively  Plan  Recommend increasing rosuvastatin to 40 mg daily   Recommend follow-up lipid panel in 4 weeks to evaluate change.  Seizures    Managed by Dr. Delice Lesch  Patient has failed these meds in past: n/a Patient is currently controlled on the following medications:  . Levetiracetam 1000 mg BID (AM, EM)   We discussed:  Patient tolerating well, has follow-up with Dr. Delice Lesch in August.   Plan  Continue current medications  Chronic Cough / GERD    Patient has failed these meds in past: n/a Patient is currently uncontrolled on the following medications:  . Pantoprazole 40 mg daily . Montelukast 10 mg daily   We discussed:  Patient reports no change in chronic cough despite  adherence to pantoprazole and montelukast. Patient to follow-up with pulmonology clinic in one week.   Plan  Continue current medications  Vaccines   Reviewed and discussed patient's vaccination history.    Immunization History  Administered Date(s) Administered  . Fluad Quad(high Dose 65+) 09/14/2018  . Influenza Split 08/17/2016  . Influenza,inj,Quad PF,6+ Mos 07/10/1917, 09/27/2017  . Influenza-Unspecified 09/19/2018  . PFIZER SARS-COV-2 Vaccination 01/30/2019, 02/21/2019  . Pneumococcal Polysaccharide-23 05/20/2016    Plan  Recommended patient receive Shingrix, Tdap vaccine.   Medication Management   Pt uses [facility] for all medications. She previously received her medications in blister packaging through [facility], but states the delivery services were no longer affordable for her.  Uses pill box? No Pt endorses missing multiple doses of Jardiance frequently.   We discussed: Patient states challenges with getting her medications from the pharmacy and affording her Jardiance. She previously applied for medicaid but was denied. She is on disability and received $1,047 monthly. She lives alone.   Plan  Utilize [facility] for medication synchronization, packaging and delivery   Verbal consent obtained for [facility]  enhanced pharmacy services (medication synchronization, adherence packaging, delivery coordination). A medication sync plan was created to allow patient to get all medications delivered once every 30 to 90 days per patient preference. Patient understands they have freedom to choose pharmacy and clinical pharmacist will coordinate care between all prescribers and [facility].  Patient to reach out to [facility] counsellors to apply for LIS ("Extra Help") Program  Follow up: 1 month phone visit  [place] [facility]  [phone]

## 2019-07-20 ENCOUNTER — Ambulatory Visit: Payer: Medicare Other

## 2019-07-20 DIAGNOSIS — E0865 Diabetes mellitus due to underlying condition with hyperglycemia: Secondary | ICD-10-CM

## 2019-07-20 DIAGNOSIS — E7849 Other hyperlipidemia: Secondary | ICD-10-CM

## 2019-07-20 NOTE — Patient Instructions (Addendum)
Visit Information It was great speaking with you today!  Please let me know if you have any questions about our visit.  Goals Addressed            This Visit's Progress   . Chronic Care Management       CARE PLAN ENTRY (see longitudinal plan of care for additional care plan information)  Current Barriers:  . Chronic Disease Management support, education, and care coordination needs related to Hypertension, Hyperlipidemia, Diabetes, Coronary Artery Disease, and History of Seizures   Hypertension BP Readings from Last 3 Encounters:  05/24/19 128/86  05/24/19 130/78  05/09/19 112/68   . Pharmacist Clinical Goal(s): o Over the next 90 days, patient will work with PharmD and providers to maintain BP goal <130/80 . Current regimen:  o Amlodipine 10 mg daily . Patient self care activities - Over the next 90 days, patient will: o Ensure daily salt intake < 2300 mg/day  Hyperlipidemia Lab Results  Component Value Date/Time   LDLCALC 100 (H) 09/14/2018 01:57 PM   LDLDIRECT 122.0 09/14/2018 01:57 PM   . Pharmacist Clinical Goal(s): o Over the next 90 days, patient will work with PharmD and providers to achieve LDL goal < 70 . Current regimen:  o Rosuvastatin 20 mg daily . Interventions: o Recommend increasing rosuvastatin to 40 mg daily  o Recommend rechecking lipid panel in one month  . Patient self care activities - Over the next 90 days, patient will: o Limit red meat, processed meats, and fatty or greasy foods   Diabetes Lab Results  Component Value Date/Time   HGBA1C 8.7 (H) 09/14/2018 01:57 PM   HGBA1C 9.5 (H) 06/22/2018 10:05 AM   . Pharmacist Clinical Goal(s): o Over the next 90 days, patient will work with PharmD and providers to achieve A1c goal <7% . Current regimen:  . Metformin 1000 mg twice daily  . Jardiance 10 mg daily . Interventions: o Recommend using a continuous glucose monitor that you wear on your arm to help check your blood sugars  . Patient self  care activities - Over the next 90 days, patient will: o Contact provider with any episodes of hypoglycemia  Medication management . Pharmacist Clinical Goal(s): o Over the next 90 days, patient will work with PharmD and providers to maintain optimal medication adherence . Current pharmacy: [facility] . Interventions o Comprehensive medication review performed. o Utilize [facility] for medication synchronization, packaging and delivery o Verbal consent obtained for [facility] enhanced pharmacy services (medication synchronization, adherence packaging, delivery coordination). A medication sync plan was created to allow patient to get all medications delivered once every 30 to 90 days per patient preference. Patient understands they have freedom to choose pharmacy and clinical pharmacist will coordinate care between all prescribers and [facility]. . Patient self care activities - Over the next 90 days, patient will: o Reach out to the [facility] center to reach out to see if you qualify for the Extra Help Program. You can reach them at [phone]. If you qualify, the cost of all of your medicines will be reduced.  o Take medications as prescribed o Report any questions or concerns to PharmD and/or provider(s)       Ms. Bosque was given information about Chronic Care Management services today including:  1. CCM service includes personalized support from designated clinical staff supervised by her physician, including individualized plan of care and coordination with other care providers 2. 24/7 contact phone numbers for assistance for urgent and  routine care needs. 3. Standard insurance, coinsurance, copays and deductibles apply for chronic care management only during months in which we provide at least 20 minutes of these services. Most insurances cover these services at 100%, however patients may be responsible for any copay, coinsurance and/or deductible if applicable.  This service may help you avoid the need for more expensive face-to-face services. 4. Only one practitioner may furnish and bill the service in a calendar month. 5. The patient may stop CCM services at any time (effective at the end of the month) by phone call to the office staff.  Patient agreed to services and verbal consent obtained.   The patient verbalized understanding of instructions provided today and agreed to receive a mailed copy of patient instruction and/or educational materials. Telephone follow up appointment with pharmacy team member scheduled for: 08/02/19 at 1:00 PM  [place] [facility] [place]  [place] stands for "Dietary Approaches to Stop Hypertension." The DASH eating plan is a healthy eating plan that has been shown to reduce high blood pressure (hypertension). It may also reduce your risk for type 2 diabetes, heart disease, and stroke. The DASH eating plan may also help with weight loss. What are tips for following this plan?  General guidelines  Avoid eating more than 2,300 mg (milligrams) of salt (sodium) a day. If you have hypertension, you may need to reduce your sodium intake to 1,500 mg a day.  Limit alcohol intake to no more than 1 drink a day for nonpregnant women and 2 drinks a day for men. One drink equals 12 oz of beer, 5 oz of wine, or 1 oz of hard liquor.  Work with your health care provider to maintain a healthy body weight or to lose weight. Ask what an ideal weight is for you.  Get at least 30 minutes of exercise that causes your heart to beat faster (aerobic exercise) most days of the week. Activities may include walking, swimming, or biking.  Work with your health care provider or diet and nutrition specialist (dietitian) to adjust your eating plan to your individual calorie needs. Reading food labels   Check food labels for the amount of sodium per serving. Choose foods with  less than 5 percent of the Daily Value of sodium. Generally, foods with less than 300 mg of sodium per serving fit into this eating plan.  To find whole grains, look for the word "whole" as the first word in the ingredient list. Shopping  Buy products labeled as "low-sodium" or "no salt added."  Buy fresh foods. Avoid canned foods and premade or frozen meals. Cooking  Avoid adding salt when cooking. Use salt-free seasonings or herbs instead of table salt or sea salt. Check with your health care provider or pharmacist before using salt substitutes.  Do not fry foods. Cook foods using healthy methods such as baking, boiling, grilling, and broiling instead.  Cook with heart-healthy oils, such as olive, canola, soybean, or sunflower oil. Meal planning  Eat a balanced diet that includes: ? 5 or more servings of fruits and vegetables each day. At each meal, try to fill half of your plate with fruits and vegetables. ? Up to 6-8 servings of whole grains each day. ? Less than 6 oz of lean meat, poultry, or fish each day. A 3-oz serving of meat is about the same size as a deck of cards. One egg equals 1 oz. ? 2 servings of low-fat dairy  each day. ? A serving of nuts, seeds, or beans 5 times each week. ? Heart-healthy fats. Healthy fats called Omega-3 fatty acids are found in foods such as flaxseeds and coldwater fish, like sardines, salmon, and mackerel.  Limit how much you eat of the following: ? Canned or prepackaged foods. ? Food that is high in trans fat, such as fried foods. ? Food that is high in saturated fat, such as fatty meat. ? Sweets, desserts, sugary drinks, and other foods with added sugar. ? Full-fat dairy products.  Do not salt foods before eating.  Try to eat at least 2 vegetarian meals each week.  Eat more home-cooked food and less restaurant, buffet, and fast food.  When eating at a restaurant, ask that your food be prepared with less salt or no salt, if possible. What  foods are recommended? The items listed may not be a complete list. Talk with your dietitian about what dietary choices are best for you. Grains Whole-grain or whole-wheat bread. Whole-grain or whole-wheat pasta. Brown rice. [place]. Bulgur. Whole-grain and low-sodium cereals. Pita bread. Low-fat, low-sodium crackers. Whole-wheat flour tortillas. Vegetables Fresh or frozen vegetables (raw, steamed, roasted, or grilled). Low-sodium or reduced-sodium tomato and vegetable juice. Low-sodium or reduced-sodium tomato sauce and tomato paste. Low-sodium or reduced-sodium canned vegetables. Fruits All fresh, dried, or frozen fruit. Canned fruit in natural juice (without added sugar). Meat and other protein foods Skinless chicken or [place]. Ground chicken or [place]. Pork with fat trimmed off. Fish and seafood. Egg whites. Dried beans, peas, or lentils. Unsalted nuts, nut butters, and seeds. Unsalted canned beans. Lean cuts of beef with fat trimmed off. Low-sodium, lean deli meat. Dairy Low-fat (1%) or fat-free (skim) milk. Fat-free, low-fat, or reduced-fat cheeses. Nonfat, low-sodium ricotta or cottage cheese. Low-fat or nonfat yogurt. Low-fat, low-sodium cheese. Fats and oils Soft margarine without trans fats. Vegetable oil. Low-fat, reduced-fat, or light mayonnaise and salad dressings (reduced-sodium). Canola, safflower, olive, soybean, and sunflower oils. Avocado. Seasoning and other foods Herbs. Spices. Seasoning mixes without salt. Unsalted popcorn and pretzels. Fat-free sweets. What foods are not recommended? The items listed may not be a complete list. Talk with your dietitian about what dietary choices are best for you. Grains Baked goods made with fat, such as croissants, muffins, or some breads. Dry pasta or rice meal packs. Vegetables Creamed or fried vegetables. Vegetables in a cheese sauce. Regular canned vegetables (not low-sodium or reduced-sodium). Regular canned tomato sauce and  paste (not low-sodium or reduced-sodium). Regular tomato and vegetable juice (not low-sodium or reduced-sodium). Angie Fava. Olives. Fruits Canned fruit in a light or heavy syrup. Fried fruit. Fruit in cream or butter sauce. Meat and other protein foods Fatty cuts of meat. Ribs. Fried meat. Berniece Salines. Sausage. Bologna and other processed lunch meats. Salami. Fatback. Hotdogs. Bratwurst. Salted nuts and seeds. Canned beans with added salt. Canned or smoked fish. Whole eggs or egg yolks. Chicken or [place] with skin. Dairy Whole or 2% milk, cream, and half-and-half. Whole or full-fat cream cheese. Whole-fat or sweetened yogurt. Full-fat cheese. Nondairy creamers. Whipped toppings. Processed cheese and cheese spreads. Fats and oils Butter. Stick margarine. Lard. Shortening. Ghee. Bacon fat. Tropical oils, such as coconut, palm kernel, or palm oil. Seasoning and other foods Salted popcorn and pretzels. Onion salt, garlic salt, seasoned salt, table salt, and sea salt. Worcestershire sauce. Tartar sauce. Barbecue sauce. Teriyaki sauce. Soy sauce, including reduced-sodium. Steak sauce. Canned and packaged gravies. Fish sauce. Oyster sauce. Cocktail sauce. Horseradish that you find on  the shelf. Ketchup. Mustard. Meat flavorings and tenderizers. Bouillon cubes. Hot sauce and Tabasco sauce. Premade or packaged marinades. Premade or packaged taco seasonings. Relishes. Regular salad dressings. Where to find more information:  National Heart, Lung, and [place]: https://wilson-eaton.com/  American Heart Association: www.heart.org Summary  The DASH eating plan is a healthy eating plan that has been shown to reduce high blood pressure (hypertension). It may also reduce your risk for type 2 diabetes, heart disease, and stroke.  With the DASH eating plan, you should limit salt (sodium) intake to 2,300 mg a day. If you have hypertension, you may need to reduce your sodium intake to 1,500 mg a day.  When on the DASH  eating plan, aim to eat more fresh fruits and vegetables, whole grains, lean proteins, low-fat dairy, and heart-healthy fats.  Work with your health care provider or diet and nutrition specialist (dietitian) to adjust your eating plan to your individual calorie needs. This information is not intended to replace advice given to you by your health care provider. Make sure you discuss any questions you have with your health care provider. Document Revised: 12/10/2016 Document Reviewed: 12/22/2015 Elsevier Patient Education  2020 [employer].

## 2019-07-23 ENCOUNTER — Telehealth: Payer: Self-pay | Admitting: Internal Medicine

## 2019-07-23 ENCOUNTER — Other Ambulatory Visit: Payer: Self-pay | Admitting: Neurology

## 2019-07-23 ENCOUNTER — Telehealth: Payer: Self-pay

## 2019-07-23 DIAGNOSIS — E7849 Other hyperlipidemia: Secondary | ICD-10-CM

## 2019-07-23 DIAGNOSIS — Z8673 Personal history of transient ischemic attack (TIA), and cerebral infarction without residual deficits: Secondary | ICD-10-CM

## 2019-07-23 DIAGNOSIS — R0989 Other specified symptoms and signs involving the circulatory and respiratory systems: Secondary | ICD-10-CM

## 2019-07-23 DIAGNOSIS — I10 Essential (primary) hypertension: Secondary | ICD-10-CM

## 2019-07-23 NOTE — Telephone Encounter (Signed)
Pt was called and she said she does not need medication refills besides the Keppra, pt advised to call neuro for that medication

## 2019-07-23 NOTE — Telephone Encounter (Signed)
Called and spoke with Patient.  Patient stated she does not need any refills of Singulair or Pepcid at this time. Explained message was left by [facility].  Patient stated she will be changing Pharmacies later on.  Nothing further at this time.

## 2019-07-23 NOTE — Telephone Encounter (Signed)
Spoke with pharmacy staff to notify that pt has previous prescription with refills for Keppra at [facility] and requested they have script transferred over to [facility] if this is pts plan, she verbalized understanding and will start transfer process.

## 2019-07-23 NOTE — Telephone Encounter (Signed)
Devin from [facility] called to request refill of Keppra for patient

## 2019-07-23 NOTE — Telephone Encounter (Signed)
Patient is requesting refill of amlodipine 10 mg daily, rosuvastatin 20 mg daily, and metformin 1000 mg twice daily. She is due for a follow-up with her PCP but she only has a few days of medications remaining.

## 2019-07-23 NOTE — Telephone Encounter (Signed)
This pt should have plenty of refills but has not been seen since 09/2018 and that was just for a pre op visit. Pt needs to call pharmacy and schedule appt ASAP.

## 2019-07-24 MED ORDER — ROSUVASTATIN CALCIUM 20 MG PO TABS: 20.0000 mg | ORAL_TABLET | Freq: Every day | ORAL | 1 refills | Status: DC

## 2019-07-24 MED ORDER — AMLODIPINE BESYLATE 10 MG PO TABS: 10.0000 mg | ORAL_TABLET | Freq: Every day | ORAL | 1 refills | Status: DC

## 2019-07-24 NOTE — Telephone Encounter (Signed)
Please schedule for OV.

## 2019-07-24 NOTE — Addendum Note (Signed)
Addended by: Abelino Derrick A on: 07/24/2019 12:10 PM   Modules accepted: Orders

## 2019-07-27 ENCOUNTER — Ambulatory Visit: Payer: Medicare Other | Admitting: Pulmonary Disease

## 2019-07-30 ENCOUNTER — Other Ambulatory Visit: Payer: Self-pay

## 2019-07-31 ENCOUNTER — Encounter: Payer: Self-pay | Admitting: Primary Care

## 2019-07-31 ENCOUNTER — Ambulatory Visit (INDEPENDENT_AMBULATORY_CARE_PROVIDER_SITE_OTHER): Payer: Medicare Other | Admitting: Family Medicine

## 2019-07-31 ENCOUNTER — Ambulatory Visit (INDEPENDENT_AMBULATORY_CARE_PROVIDER_SITE_OTHER): Payer: Medicare Other | Admitting: Primary Care

## 2019-07-31 ENCOUNTER — Encounter: Payer: Self-pay | Admitting: Family Medicine

## 2019-07-31 VITALS — BP 106/68 | HR 80 | Temp 97.4°F | Ht 64.0 in | Wt 173.2 lb

## 2019-07-31 DIAGNOSIS — E119 Type 2 diabetes mellitus without complications: Secondary | ICD-10-CM | POA: Diagnosis not present

## 2019-07-31 DIAGNOSIS — J45991 Cough variant asthma: Secondary | ICD-10-CM | POA: Diagnosis not present

## 2019-07-31 DIAGNOSIS — I6522 Occlusion and stenosis of left carotid artery: Secondary | ICD-10-CM | POA: Diagnosis not present

## 2019-07-31 DIAGNOSIS — I10 Essential (primary) hypertension: Secondary | ICD-10-CM

## 2019-07-31 DIAGNOSIS — E7800 Pure hypercholesterolemia, unspecified: Secondary | ICD-10-CM | POA: Diagnosis not present

## 2019-07-31 DIAGNOSIS — E0865 Diabetes mellitus due to underlying condition with hyperglycemia: Secondary | ICD-10-CM | POA: Diagnosis not present

## 2019-07-31 DIAGNOSIS — Z91199 Patient's noncompliance with other medical treatment and regimen due to unspecified reason: Secondary | ICD-10-CM

## 2019-07-31 DIAGNOSIS — Z9119 Patient's noncompliance with other medical treatment and regimen: Secondary | ICD-10-CM

## 2019-07-31 MED ORDER — FAMOTIDINE 20 MG PO TABS: 20.0000 mg | ORAL_TABLET | Freq: Two times a day (BID) | ORAL | 11 refills | Status: DC

## 2019-07-31 MED ORDER — FLOVENT HFA 44 MCG/ACT IN AERO: 2.0000 | INHALATION_SPRAY | Freq: Two times a day (BID) | RESPIRATORY_TRACT | 2 refills | Status: DC

## 2019-07-31 MED ORDER — MONTELUKAST SODIUM 10 MG PO TABS: 10.0000 mg | ORAL_TABLET | Freq: Every day | ORAL | 11 refills | Status: DC

## 2019-07-31 NOTE — Assessment & Plan Note (Addendum)
-   Cough returned in February 2021, worse off H2 blocker/singulair  - Resume Pepcid 20mg  twice daily and Singulair 10mg  at bedtime - Trial Flovent 12mcg 2 puffs BID (rinse mouth after use) - Advised patient not to stop taking these medications, sent in enough refills to last the a year - Follow-up in 4-6 weeks with Dr. Melvyn Novas or Eustaquio Maize NP

## 2019-07-31 NOTE — Progress Notes (Signed)
@Patient  ID: Wendy [name], female    DOB: 12/28/1955, 64 y.o.   MRN: [id number]  Chief Complaint  Patient presents with  . Follow-up    Cough/Asthma, worseing cough with chest tightness    Referring provider: [name]  HPI: 64 year old female, former light smoker quit in 2013 (2.5-pack-year history).  Past medical history significant for cough variant asthma.  Patient of Dr. Melvyn Novas, last seen in office on May 24, 2019.  Patient called office in late June with reports of worsening cough, requesting refill tramadol (last filled September 2020).   Previous [facility] pulmonary encounters:     Cough since 2016 made worse by acei use d/c'd in 07/2015  Allergy profile 07/23/2016 >  Eos 0.1 /  IgE  15 Rast neg - Sinus CT 08/02/2016 > Clear paranasal sinuses. - cyclical cough protocol 07/23/2016 and 08/17/2016 > non adherent - Spirometry 10/15/2016  Wnl/ very min curvature  - repeat cyclical cough protocol 10/15/2016 with Prednisone 10 mg take  4 each am x 2 days,   2 each am x 2 days,  1 each am x 2 days and stop and schedule MCT on gerd rx x 2 weeks then refer to [facility] if still coughing - MCT 10/25/16  >  POS for reversible airlfow obstruction s symptoms> started singulair / continued 1st gen H1 blockers per guidelines   - 04/18/17 rx pred/ restarted singulair and 1st gen H1 blockers per guidelines  And took tramadol from prior ov and reprots 100% elimination of cough  - 02/2019 relapse cough off all rx for over a year s flare > restarted singulair/ gerd rx 05/09/19 plus 6 d prednisone      07/31/2019- interim hx Patient presents today for follow-up Cough. Patient experienced relapse of cough in February 2021 off of all Rx from previous year, patient was restarted on Singulair/famotidine twice daily + 6 days of prednisone.  Patient states that her cough improved end of 2020, started back in February 2021. She continues to have dry cough that is very debilitating. She ran out of Famotidine and  Singulair a couple of weeks ago. She has some associated chest tightness. She has responsed well to oral prednisone in the past. She does not want to take tramadol. Denies fever, chills or shortness of breath.   Allergies  Allergen Reactions  . Hydrocodone-Ibuprofen     Unknown to pt  . Lisinopril Cough  . Vicodin [Hydrocodone-Acetaminophen] Palpitations    Pt states she can take tylenol    Immunization History  Administered Date(s) Administered  . Fluad Quad(high Dose 65+) 09/14/2018  . Influenza Split 08/17/2016  . Influenza,inj,Quad PF,6+ Mos 07/10/1917, 09/27/2017  . Influenza-Unspecified 09/19/2018  . PFIZER SARS-COV-2 Vaccination 01/30/2019, 02/21/2019  . Pneumococcal Polysaccharide-23 05/20/2016    Past Medical History:  Diagnosis Date  . Blood transfusion without reported diagnosis   . Chicken pox   . Cough   . Depression   . Helicobacter pylori gastritis    treated 05/2017  . History of loop recorder    3 years ago- in [place]. at [facility]  . Hyperlipidemia   . Hypertension   . Left-sided weakness   . Stroke ([place]) 03/2015 and 04/2015   Pt had 2 strokes/weakness on the left side  . Type 2 diabetes mellitus ([place])    Patient states she is a boader line diabetic    Tobacco History: Social History   Tobacco Use  Smoking Status Former Smoker  . Packs/day: 0.25  .  Years: 10.00  . Pack years: 2.50  . Types: Cigarettes  . Quit date: 01/12/2011  . Years since quitting: 8.5  Smokeless Tobacco Never Used   Counseling given: Not Answered   Outpatient Medications Prior to Visit  Medication Sig Dispense Refill  . amLODipine (NORVASC) 10 MG tablet Take 1 tablet (10 mg total) by mouth daily. 30 tablet 1  . ASPIRIN LOW DOSE 81 MG EC tablet TAKE 1 TABLET BY MOUTH DAILY 30 tablet 2  . empagliflozin (JARDIANCE) 10 MG TABS tablet Take 10 mg by mouth daily before breakfast. 30 tablet 2  . levETIRAcetam (KEPPRA) 1000 MG tablet TAKE 1 TABLET BY MOUTH TWICE DAILY  *PATIENT NEEDS APPOINTMENT FOR REFILLS* 60 tablet 5  . metFORMIN (GLUCOPHAGE) 1000 MG tablet TAKE 1 TABLET BY MOUTH TWICE DAILY WITH MEALS 60 tablet 11  . rosuvastatin (CRESTOR) 20 MG tablet Take 1 tablet (20 mg total) by mouth daily. 30 tablet 1  . famotidine (PEPCID) 20 MG tablet One after supper 30 tablet 11  . montelukast (SINGULAIR) 10 MG tablet Take 1 tablet (10 mg total) by mouth at bedtime. 30 tablet 11  . pantoprazole (PROTONIX) 40 MG tablet Take 1 tablet (40 mg total) by mouth daily. Take 30-60 min before first meal of the day 30 tablet 2   Facility-Administered Medications Prior to Visit  Medication Dose Route Frequency Provider Last Rate Last Admin  . lidocaine-EPINEPHrine (XYLOCAINE W/EPI) 1 %-1:100000 (with pres) injection 10 mL  10 mL Infiltration Once Croitoru, Mihai, MD        Review of Systems  Review of Systems  Constitutional: Negative.   Respiratory: Positive for cough and chest tightness. Negative for shortness of breath and wheezing.     Physical Exam  BP 110/70 (BP Location: Right Arm, Cuff Size: Normal)   Pulse 85   Temp (!) 97.3 F (36.3 C) (Oral)   Ht 5\' 4"  (1.626 m)   Wt 173 lb 6.4 oz (78.7 kg)   SpO2 97%   BMI 29.76 kg/m  Physical Exam Constitutional:      Appearance: Normal appearance.  HENT:     Head: Normocephalic and atraumatic.     Mouth/Throat:     Mouth: Mucous membranes are moist.     Pharynx: Oropharynx is clear.  Cardiovascular:     Rate and Rhythm: Normal rate and regular rhythm.  Pulmonary:     Effort: Pulmonary effort is normal.     Breath sounds: Normal breath sounds. No wheezing or rhonchi.  Musculoskeletal:        General: Normal range of motion.     Comments: Amb with cane  Skin:    General: Skin is warm and dry.  Neurological:     General: No focal deficit present.     Mental Status: She is alert and oriented to person, place, and time. Mental status is at baseline.  Psychiatric:        Mood and Affect: Mood normal.         Behavior: Behavior normal.        Thought Content: Thought content normal.        Judgment: Judgment normal.      Lab Results:  CBC    Component Value Date/Time   WBC 8.0 09/20/2018 1541   RBC 5.45 (H) 09/20/2018 1541   HGB 14.8 09/20/2018 1541   HCT 44.7 09/20/2018 1541   PLT 242 09/20/2018 1541   MCV 82.0 09/20/2018 1541   MCH 27.2 09/20/2018 1541  MCHC 33.1 09/20/2018 1541   RDW 12.8 09/20/2018 1541   LYMPHSABS 2.1 07/23/2016 1555   MONOABS 0.6 07/23/2016 1555   EOSABS 0.1 07/23/2016 1555   BASOSABS 0.0 07/23/2016 1555    BMET    Component Value Date/Time   NA 139 09/20/2018 1541   NA 141 09/09/2015 0000   K 4.8 09/20/2018 1541   CL 102 09/20/2018 1541   CO2 26 09/20/2018 1541   GLUCOSE 103 (H) 09/20/2018 1541   BUN 11 09/20/2018 1541   CREATININE 0.79 09/20/2018 1541   CALCIUM 9.5 09/20/2018 1541   GFRNONAA >60 09/20/2018 1541   GFRAA >60 09/20/2018 1541    BNP No results found for: BNP  ProBNP No results found for: PROBNP  Imaging: No results found.   Assessment & Plan:   Cough variant asthma - Cough returned in February 2021, worse off H2 blocker/singulair  - Resume Pepcid 20mg  twice daily and Singulair 10mg  at bedtime - Trial Flovent 2 puffs twice daily (rinse mouth after use) - Advised patient not to stop taking these medications, sent in enough refills to last the a year - Follow-up in 4-6 weeks with Dr. Melvyn Novas or Eustaquio Maize NP    Martyn Ehrich, NP 07/31/2019

## 2019-07-31 NOTE — Patient Instructions (Addendum)
Recommend: STOP Protonix as you have Resume Pepcid 20mg  twice daily Resume Singulair 10mg  daily  Trial Flovent 2 puffs twice daily (rinse mouth after use)  Do not stop taking these medications above, sent in enough refills to last the rest of the year  Follow-up: 4-6 weeks with Dr. Melvyn Novas or Eustaquio Maize NP
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
## 2019-08-01 ENCOUNTER — Other Ambulatory Visit (INDEPENDENT_AMBULATORY_CARE_PROVIDER_SITE_OTHER): Payer: Medicare Other

## 2019-08-01 DIAGNOSIS — I10 Essential (primary) hypertension: Secondary | ICD-10-CM | POA: Diagnosis not present

## 2019-08-01 DIAGNOSIS — E7800 Pure hypercholesterolemia, unspecified: Secondary | ICD-10-CM

## 2019-08-01 DIAGNOSIS — E119 Type 2 diabetes mellitus without complications: Secondary | ICD-10-CM | POA: Diagnosis not present

## 2019-08-01 DIAGNOSIS — I6522 Occlusion and stenosis of left carotid artery: Secondary | ICD-10-CM | POA: Diagnosis not present

## 2019-08-01 DIAGNOSIS — E0865 Diabetes mellitus due to underlying condition with hyperglycemia: Secondary | ICD-10-CM

## 2019-08-01 LAB — URINALYSIS, ROUTINE W REFLEX MICROSCOPIC
Bilirubin Urine: NEGATIVE
Hgb urine dipstick: NEGATIVE
Leukocytes,Ua: NEGATIVE
Nitrite: NEGATIVE
Specific Gravity, Urine: 1.02 (ref 1.000–1.030)
Total Protein, Urine: NEGATIVE
Urine Glucose: >=1000 — AB
Urobilinogen, UA: 0.2 (ref 0.0–1.0)
pH: 5 (ref 5.0–8.0)

## 2019-08-01 LAB — LDL CHOLESTEROL, DIRECT: Direct LDL: 86 mg/dL

## 2019-08-01 LAB — CBC
HCT: 44 % (ref 36.0–46.0)
Hemoglobin: 15.1 g/dL — ABNORMAL HIGH (ref 12.0–15.0)
MCHC: 34.3 g/dL (ref 30.0–36.0)
MCV: 78.8 fl (ref 78.0–100.0)
Platelets: 228 10*3/uL (ref 150.0–400.0)
RBC: 5.58 Mil/uL — ABNORMAL HIGH (ref 3.87–5.11)
RDW: 13.6 % (ref 11.5–15.5)
WBC: 7.8 10*3/uL (ref 4.0–10.5)

## 2019-08-01 LAB — COMPREHENSIVE METABOLIC PANEL
ALT: 29 U/L (ref 0–35)
AST: 29 U/L (ref 0–37)
Albumin: 4.6 g/dL (ref 3.5–5.2)
Alkaline Phosphatase: 118 U/L — ABNORMAL HIGH (ref 39–117)
BUN: 14 mg/dL (ref 6–23)
CO2: 28 mEq/L (ref 19–32)
Calcium: 9.5 mg/dL (ref 8.4–10.5)
Chloride: 101 mEq/L (ref 96–112)
Creatinine, Ser: 0.96 mg/dL (ref 0.40–1.20)
GFR: 70.36 mL/min (ref >60.00)
Glucose, Bld: 162 mg/dL — ABNORMAL HIGH (ref 70–99)
Potassium: 4.1 mEq/L (ref 3.5–5.1)
Sodium: 137 mEq/L (ref 135–145)
Total Bilirubin: 0.6 mg/dL (ref 0.2–1.2)
Total Protein: 7.6 g/dL (ref 6.0–8.3)

## 2019-08-01 LAB — MICROALBUMIN / CREATININE URINE RATIO
Creatinine,U: 85.2 mg/dL
Microalb Creat Ratio: 0.8 mg/g (ref 0.0–30.0)
Microalb, Ur: <0.7 mg/dL (ref 0.0–1.9)

## 2019-08-01 LAB — LIPID PANEL
Cholesterol: 166 mg/dL (ref 0–200)
HDL: 49.2 mg/dL (ref >39.00)
LDL Cholesterol: 83 mg/dL (ref 0–99)
NonHDL: 116.31
Total CHOL/HDL Ratio: 3
Triglycerides: 166 mg/dL — ABNORMAL HIGH (ref 0.0–149.0)
VLDL: 33.2 mg/dL (ref 0.0–40.0)

## 2019-08-01 LAB — HEMOGLOBIN A1C: Hgb A1c MFr Bld: 10.1 % — ABNORMAL HIGH (ref 4.6–6.5)

## 2019-08-01 NOTE — Addendum Note (Signed)
Addended by: Lynnea Ferrier on: 08/01/2019 07:37 AM   Modules accepted: Orders

## 2019-08-02 ENCOUNTER — Telehealth: Payer: Medicare Other

## 2019-08-02 ENCOUNTER — Telehealth: Payer: Self-pay | Admitting: Primary Care

## 2019-08-02 MED ORDER — FLOVENT HFA 44 MCG/ACT IN AERO: 2.0000 | INHALATION_SPRAY | Freq: Two times a day (BID) | RESPIRATORY_TRACT | 6 refills | Status: DC

## 2019-08-02 NOTE — Telephone Encounter (Signed)
Patient contacted, Flovent sent to preferred pharmacy.

## 2019-08-10 ENCOUNTER — Telehealth: Payer: Self-pay

## 2019-08-10 DIAGNOSIS — I10 Essential (primary) hypertension: Secondary | ICD-10-CM

## 2019-08-10 DIAGNOSIS — E0865 Diabetes mellitus due to underlying condition with hyperglycemia: Secondary | ICD-10-CM

## 2019-08-10 NOTE — Progress Notes (Addendum)
Chronic Care Management Pharmacy Assistant   Name: Wendy [name]  MRN: [id number] DOB: March 28, 1955  Reason for Encounter: Medication Review  Patient Questions:  1.  Have you seen any other providers since your last visit? Yes, 07/31/2019 Patient was seen by PCP with no medication changes and on 07/31/2019 patient was seen by Pulmonogist advised patient to resume Pepcid 20mg  twice daily and Singulair 10mg  at bedtime,Trial Flovent 2 puffs twice daily (rinse mouth after use).  2.  Any changes in your medicines or health? No   Wendy [name],  64 y.o. , female presents for their Follow-Up CCM visit with the clinical pharmacist via telephone.  PCP : [name], MD  Allergies:   Allergies  Allergen Reactions  . Hydrocodone-Ibuprofen     Unknown to pt  . Lisinopril Cough  . Vicodin [Hydrocodone-Acetaminophen] Palpitations    Pt states she can take tylenol    Medications: Outpatient Encounter Medications as of 08/10/2019  Medication Sig  . amLODipine (NORVASC) 10 MG tablet Take 1 tablet (10 mg total) by mouth daily.  . ASPIRIN LOW DOSE 81 MG EC tablet TAKE 1 TABLET BY MOUTH DAILY  . empagliflozin (JARDIANCE) 10 MG TABS tablet Take 10 mg by mouth daily before breakfast.  . famotidine (PEPCID) 20 MG tablet Take 1 tablet (20 mg total) by mouth 2 (two) times daily. One after supper  . fluticasone (FLOVENT HFA) 44 MCG/ACT inhaler Inhale 2 puffs into the lungs 2 (two) times daily.  Marland Kitchen levETIRAcetam (KEPPRA) 1000 MG tablet TAKE 1 TABLET BY MOUTH TWICE DAILY *PATIENT NEEDS APPOINTMENT FOR REFILLS*  . metFORMIN (GLUCOPHAGE) 1000 MG tablet TAKE 1 TABLET BY MOUTH TWICE DAILY WITH MEALS  . montelukast (SINGULAIR) 10 MG tablet Take 1 tablet (10 mg total) by mouth at bedtime.  . rosuvastatin (CRESTOR) 20 MG tablet Take 1 tablet (20 mg total) by mouth daily.   Facility-Administered Encounter Medications as of 08/10/2019  Medication  . lidocaine-EPINEPHrine (XYLOCAINE W/EPI) 1 %-1:100000 (with  pres) injection 10 mL    Current Diagnosis: Patient Active Problem List   Diagnosis Date Noted  . Non-compliant patient 07/31/2019  . Aspiration pneumonia of both lower lobes ([place]) 05/07/2019  . Epilepsy ([place]) 05/07/2019  . Aortic atherosclerosis ([place]) 10/05/2018  . Morbid obesity due to excess calories ([place]) 10/05/2018  . Abnormal EKG 09/14/2018  . Elevated LDL cholesterol level 09/14/2018  . Pre-operative clearance 09/14/2018  . Chronic left sacroiliac pain 08/07/2018  . Stenosis of left carotid artery 07/21/2018  . Diabetes mellitus due to underlying condition, uncontrolled, with hyperglycemia ([place]) 06/23/2018  . Ovarian cyst 08/20/2017  . Cough variant asthma 11/24/2016  . Obesity (BMI 30-39.9) 07/24/2016  . Upper airway cough syndrome 07/23/2016  . History of loop recorder 07/20/2016  . Left carotid bruit 07/20/2016  . Hyperlipidemia 02/04/2016  . History of CVA (cerebrovascular accident) 02/04/2016  . Spastic hemiparesis of left dominant side ([place]) 02/04/2016  . Seizures ([place]) 02/04/2016  . Essential hypertension 04/21/2015  . Abdominal or pelvic swelling, mass or lump, unspecified site 04/21/2015  . Prediabetes 04/03/2015  . Cerebral artery occlusion with cerebral infarction [facility]) 03/22/2015    Goals Addressed   None     Follow-Up:  Coordination of Enhanced Pharmacy Services   Reviewed chart for medication changes ahead of medication coordination call.  Yes, 07/31/2019 Patient was seen by PCP with no medication changes and on 07/31/2019 patient was seen by Pulmonogist advised patient to resume Pepcid 20mg  twice daily and Singulair 10mg  at bedtime ,  Trial Flovent 2 puffs twice daily (rinse mouth after use).  No medication changes indicated.  BP Readings from Last 3 Encounters:  07/31/19 110/70  07/31/19 106/68  05/24/19 128/86    Lab Results  Component Value Date   HGBA1C 10.1 (H) 08/01/2019     Patient obtains medications through Adherence Packaging  30  Days   Last adherence delivery included: none ID   Patient is due for next adherence delivery on: 08/17/2019. Called patient and reviewed medications and coordinated delivery.  This delivery to include:  Levetiracetam (Keppra) 1000 MG tablet twice daily - Breakfast, Dinner  Empagliflozin (Jardiance) 10 mg Tablet daily - Breakfast  Montelukast (singular) 10 mg tablet daily - Bedtime  Patient will need a short fill of Fluticasone (Flovent HFA ) 44 MCG/ACT inhaler, prior to adherence delivery. Patient stated she did not received her haler in the past and she needs it today.  Patient declined the following medications:  Amlodipine,aspirin,pepcid,metformin,rosvastatin - Patient states she has adequate supply from last delivery on 07/24/19. Patient denies missed doses or adverse events.    Confirmed delivery date of 08/17/2019, advised patient that pharmacy will contact them the morning of delivery.  [place] Pharmacist Assistant [phone]

## 2019-08-14 ENCOUNTER — Ambulatory Visit: Payer: Medicare Other | Admitting: Neurology

## 2019-08-15 ENCOUNTER — Telehealth: Payer: Medicare Other | Admitting: Family Medicine

## 2019-08-17 ENCOUNTER — Ambulatory Visit: Payer: Self-pay

## 2019-08-17 DIAGNOSIS — J45991 Cough variant asthma: Secondary | ICD-10-CM

## 2019-08-17 DIAGNOSIS — E7849 Other hyperlipidemia: Secondary | ICD-10-CM

## 2019-08-17 NOTE — Chronic Care Management (AMB) (Signed)
Patient called requesting assistance because her Flovent HFA inhaler was not working. Reviewed proper inhaler technique with patient. She states she is properly priming the medicine, inhaling 2 puffs twice daily with appropriate technique (although I was not able to visualize it today). Patient was not properly rinsing her mouth out after each use.   Despite this, patient states she feels her cough has worsened and that the flow of the inhaler was "weak"   She has a follow-up with Dr. Melvyn Novas scheduled for 08/31/19, will reach out to pulmonology team to request advice.   Doristine Section Clinical Pharmacist [facility] at [facility]  [phone]

## 2019-08-24 ENCOUNTER — Ambulatory Visit: Payer: Medicare Other | Admitting: Internal Medicine

## 2019-08-28 ENCOUNTER — Other Ambulatory Visit: Payer: Self-pay

## 2019-08-28 ENCOUNTER — Encounter: Payer: Self-pay | Admitting: Family Medicine

## 2019-08-28 ENCOUNTER — Telehealth (INDEPENDENT_AMBULATORY_CARE_PROVIDER_SITE_OTHER): Payer: Medicare Other | Admitting: Family Medicine

## 2019-08-28 VITALS — Ht 64.0 in

## 2019-08-28 DIAGNOSIS — E0865 Diabetes mellitus due to underlying condition with hyperglycemia: Secondary | ICD-10-CM | POA: Diagnosis not present

## 2019-08-28 MED ORDER — EMPAGLIFLOZIN 25 MG PO TABS: 25.0000 mg | ORAL_TABLET | Freq: Every day | ORAL | 2 refills | Status: DC
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
## 2019-08-29 ENCOUNTER — Telehealth: Payer: Self-pay

## 2019-08-29 NOTE — Progress Notes (Signed)
  Spoke with patient to  informed patient her Vania Rea has a increased dose. Would she like a short fill of the 25mg  sent out or would she like to finish the 10mg  she has on hand per Clinical Pharmacist?  Patient stated she finished her Jardiance that she has on hand.she is  requesting for more refills of Jardiance 25 mg to be sent to her .  Per [place] Pharmacist has not heard back from pulmonology and sent another message about the patient inhalers. If she wants a faster response she can try calling the clinic directly.  Patient reported she has a upcoming visit with pulmonology, and will ask about the inhalers at her appointment.  [place] Pharmacist Assistant [phone]

## 2019-08-30 ENCOUNTER — Encounter: Payer: Medicare Other | Attending: Physical Medicine & Rehabilitation | Admitting: Physical Medicine & Rehabilitation

## 2019-08-30 DIAGNOSIS — Z8249 Family history of ischemic heart disease and other diseases of the circulatory system: Secondary | ICD-10-CM | POA: Insufficient documentation

## 2019-08-30 DIAGNOSIS — Z800 Family history of malignant neoplasm of digestive organs: Secondary | ICD-10-CM | POA: Insufficient documentation

## 2019-08-30 DIAGNOSIS — I10 Essential (primary) hypertension: Secondary | ICD-10-CM | POA: Insufficient documentation

## 2019-08-30 DIAGNOSIS — I69354 Hemiplegia and hemiparesis following cerebral infarction affecting left non-dominant side: Secondary | ICD-10-CM | POA: Insufficient documentation

## 2019-08-30 DIAGNOSIS — Z87891 Personal history of nicotine dependence: Secondary | ICD-10-CM | POA: Insufficient documentation

## 2019-08-30 DIAGNOSIS — E785 Hyperlipidemia, unspecified: Secondary | ICD-10-CM | POA: Insufficient documentation

## 2019-08-30 DIAGNOSIS — E119 Type 2 diabetes mellitus without complications: Secondary | ICD-10-CM | POA: Insufficient documentation

## 2019-08-31 ENCOUNTER — Ambulatory Visit (INDEPENDENT_AMBULATORY_CARE_PROVIDER_SITE_OTHER): Payer: Medicare Other | Admitting: Internal Medicine

## 2019-08-31 ENCOUNTER — Other Ambulatory Visit: Payer: Self-pay

## 2019-08-31 ENCOUNTER — Encounter: Payer: Self-pay | Admitting: Internal Medicine

## 2019-08-31 DIAGNOSIS — R058 Other specified cough: Secondary | ICD-10-CM

## 2019-08-31 DIAGNOSIS — R05 Cough: Secondary | ICD-10-CM

## 2019-08-31 DIAGNOSIS — J45991 Cough variant asthma: Secondary | ICD-10-CM | POA: Diagnosis not present

## 2019-08-31 MED ORDER — PROMETHAZINE-DM 6.25-15 MG/5ML PO SYRP: 5.0000 mL | ORAL_SOLUTION | Freq: Four times a day (QID) | ORAL | 0 refills | Status: DC | PRN

## 2019-08-31 MED ORDER — MONTELUKAST SODIUM 10 MG PO TABS: 10.0000 mg | ORAL_TABLET | Freq: Every day | ORAL | 11 refills | Status: DC

## 2019-08-31 MED ORDER — PANTOPRAZOLE SODIUM 40 MG PO TBEC: 40.0000 mg | DELAYED_RELEASE_TABLET | Freq: Every day | ORAL | 2 refills | Status: DC

## 2019-08-31 MED ORDER — FAMOTIDINE 20 MG PO TABS
ORAL_TABLET | ORAL | Status: DC
Start: 2019-08-31 — End: 2019-10-29

## 2019-08-31 MED ORDER — PREDNISONE 10 MG PO TABS: ORAL_TABLET | ORAL | 0 refills | Status: DC

## 2019-08-31 NOTE — Patient Instructions (Addendum)
Prednisone 10 mg take  4 each am x 2 days,  2 each am x 2 days,  1 each am x 2 days and stop   Singulair (montelukast) 10 mg one daily > resume this as maintenance daily medication   For cough > phenergan dm a tsp every 4 hours as needed  Protonix (pantoprazole) Take 30-60 min before first meal of the day and Pepcid 20 mg one bedtime plus chlortabs chlorpheniramine 4 mg x 2  One hour before  bedtime (both available over the counter)  until cough is completely gone for at least a week without the need for cough suppression   GERD (REFLUX)  is an extremely common cause of respiratory symptoms just like yours , many times with no obvious heartburn at all.    It can be treated with medication, but also with lifestyle changes including elevation of the head of your bed (ideally with 6 -8inch blocks under the headboard of your bed),  Smoking cessation, avoidance of late meals, excessive alcohol, and avoid fatty foods, chocolate, peppermint, colas, red wine, and acidic juices such as orange juice.  NO MINT OR MENTHOL PRODUCTS SO NO COUGH DROPS  USE SUGARLESS CANDY INSTEAD (Jolley ranchers or Stover's or Life Savers) or even ice chips will also do - the key is to swallow to prevent all throat clearing. NO OIL BASED VITAMINS - use powdered substitutes.  Avoid fish oil when coughing.       Please schedule a follow up visit in  2 weeks with NP  but call sooner if needed  with all medications /inhalers/ solutions in hand so we can verify exactly what you are taking. This includes all medications from all doctors and over the counters  - separate the as needed medications from the ones you use every day. Late add:  If not better consider trial of gabapentin 100 tid or referral to Dr Joya Gaskins at [facility]

## 2019-08-31 NOTE — Progress Notes (Signed)
Subjective:     Patient ID: Wendy [name], female   DOB: Oct 12, 1955,    MRN: [id number]     Brief patient profile:  33 yobf quit smoking 2013 with no resp problems prior then 2016 sudden onset of coughing fit one day  at work in customer service in [place] and coughed "every day since"   Says sometime later placed on lisinopril and stopped July 2017 because the cough on ACEI was much worse and never improved off it  so referred to pulmonary clinic 07/23/2016 by Dr Wendy [name].     History of Present Illness  07/23/2016 1st [facility] office visit/ Wendy [name]   Chief Complaint  Patient presents with  . Pulmonary Consult    Referred by Dr. Silvestre [name].  Pt c/o cough for the past year- prod with white sputum.  She states that her cough is "all the time" with no specific trigger.   coughs so hard Pos urinary incont and vomiting Maybe a couple of tbsp total per day mucoid sputum, no better with tessalon / zyrtec, atrovent nasal spray  Has not tried any inhalers  cough present 24/7 and interferes with sleep  rec Stop zyrtec and tessalon and atovent  First take delsym two tsp every 12 hours and supplement if needed with  tramadol 50 mg up to 2 every 4 hours to suppress the urge to cough at all or even clear your throat.  Prednisone 10 mg take  4 each am x 2 days,   2 each am x 2 days,  1 each am x 2 days and stop (this is to eliminate allergies and inflammation from coughing) Protonix (pantoprazole) Take 30-60 min before first meal of the day and Pepcid 20 mg one bedtime plus chlorpheniramine 4 mg x 2 at bedtime (both available over the counter)  until cough is completely gone for at least a week without the need for cough suppression GERD diet Allergy profile 07/23/2016 >  Eos 0.1 /  IgE  15 Rast neg - Sinus CT 08/02/2016 >>> 1. Clear paranasal sinus     08/17/2016  f/u ov/Wendy [name] re: refractory cough on ppi qam and zantac bid Chief Complaint  Patient presents with  . Follow-up    Cough not  improving much. She is producing some clear sputum. She states she had such a violent cough a few nights ago, she vomitted. She has occ wheezing.   not compliant with recs, never got the chlorpheniramine and rarely used the tramadol in high enough dose/ freq to eliminate the cough but did admit doing some better at hs while on rx as intended but not has some sense of noct "wheeze" as well Very unsure of meds/ names esp between generic and trade names easily confused  rec For drainage / throat tickle try take CHLORPHENIRAMINE  4 mg - take up to one every 4 hours as needed - available over the counter-  May make you sleepy so only take it when you can afford to be sleepy Take delsym two tsp every 12 hours and supplement if needed with  tramadol 50 mg up to 2 every 4 hours to suppress the urge to cough. .   Continue pantoprazole Take 30-60 min before first meal of the day and Pepcid 20 mg at bedtime  GERD diet   Call if not improved in a week or two for referral to a throat specialist at [facility] (Dr Wendy [name])      10/15/2016  f/u ov/Wendy [name] re: cough  Chief Complaint  Patient presents with  . Acute Visit    Cough is not improving since the last visit. She is coughing until she is vomiting sometimes. Cough is non prod.   never took 2 x h1 at hs  Did improve while on tramadol but did not take as prescribed and got 75% improved until ran out, not clear how much she actually used, did not bring bottles back as req  Did not call for Dr Wendy [name] eval  Cough to point of vomiting 25/7 now  rec  First take delsym two tsp every 12 hours and supplement if needed with  tramadol 50 mg up to 2 every 4 hours  Once you have eliminated the cough for 3 straight days try reducing the tramadol first,  then the delsym as tolerated.   Prednisone 10 mg take  4 each am x 2 days,   2 each am x 2 days,  1 each am x 2 days and stop (this is to eliminate allergies and inflammation from coughing) Protonix (pantoprazole) Take 30-60  min before first meal of the day and Pepcid 20 mg one bedtime plus chlorpheniramine 4 mg x 2 at bedtime (both available over the counter)  until cough is completely gone for at least a week without the need for cough suppression GERD  Please see patient coordinator before you leave today  to schedule Methacholine challenge test in 2 weeks, not sooner, and if this is negative we will be referring you to Dr Wendy [name] at [facility]    - MCT 10/25/16  >  POS for reversible airlfow obstruction but denied any assoc symptoms   11/22/2016  f/u ov/Wendy [name] re: cough x 2016 / only taking pepcid prn/ stopped ppi  Chief Complaint  Patient presents with  . Follow-up    Coughing less since the last visit. No new co's today.   new med = singulair not convinced it's helped as much the 1st gen  Tends cough to when swallow saliva Overall better than she was  For last 2 years  Not limited by breathing from desired activities   rec GERD diet  Leave off the acid suppression for now  Continue singulair  For now and just take the delsym up to 2 tsp every 12 hours as needed for cough  For drainage / throat tickle try take CHLORPHENIRAMINE  4 mg - take one every 4 hours as needed - available over the counter- may cause drowsiness so start with just a bedtime dose or two and see how you tolerate it before trying in daytime       01/17/2017  f/u ov/Wendy [name] re: 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 Follow-up    cough with clear mucus  Singulair 10 mg one daily > resume this as maintenance daily medication  AS NEEDED  >>> For drainage / throat tickle try take CHLORPHENIRAMINE  4 mg - take one every 4 hours as needed - available over the counter- may cause drowsiness so start with just a bedtime dose or two and see how you tolerate it before trying in daytime   Please schedule a follow up visit in 3 months but call sooner if needed  with all medications  /inhalers/ solutions in hand so we can verify exactly what you are taking. This includes all medications from all doctors and over the counters  - separate the as needed  medications from the ones you use every day    04/18/2017  f/u ov/Wendy [name] re:  Cough variant asthma / did not fill rx for singulair/ pred helps the most / did not bring any meds  Chief Complaint  Patient presents with  . Follow-up    states she still has cough and has not seen much improvement.   Dyspnea:  Not limited by breathing from desired activities  And cough no worse with ex  Cough:  No pattern/ has cough even while sleeping/ non prod Sleep: disrupted by cough  SABA use:  None rec Prednisone 10 mg take  4 each am x 2 days,   2 each am x 2 days,  1 each am x 2 days and stop  Singulair 10 mg one daily > resume this as maintenance daily medication  AS NEEDED  >>> For drainage / throat tickle try take CHLORPHENIRAMINE  4 mg - take one every 4 hours as needed     05/09/2019  Extended f/u ov/Wendy [name] re: recurrent cough since feb 2021  after gone for more than a year s any meds  Chief Complaint  Patient presents with  . Acute Visit    Cough x 2 months- non prod "all day every day"- keeps her up in the night.   Dyspnea:  Not limited by breathing from desired activities   Cough: dry cough / no relation to eating / no change with temps / best in ams/ worse  lunch time / worst immediately at hs  Sleeping: bed is flat and 2 pillows  SABA use: none 02: none  Rec Prednisone 10 mg take  4 each am x 2 days,  2 each am x 2 days,  1 each am x 2 days and stop (this is to eliminate allergies and inflammation from coughing) Protonix (pantoprazole) Take 30-60 min before first meal of the day and Pepcid 20 mg one bedtime plus chlortabs chlorpheniramine 4 mg x 2  One hour before  bedtime (both available over the counter)  until cough is completely gone for at least a week without the need for cough suppression Singulair 10 mg one daily > resume  this as maintenance daily medication  Please schedule a follow up visit in  2 weeks  but call sooner if needed  with all medications   05/24/2019  f/u ov/Wendy [name] re: cough variant asthma  ? With uacs / better back on singulair and gerd rx  Chief Complaint  Patient presents with  . Follow-up    pt states cough but states it has improved. pt states finish predinose  Dyspnea:  Not limited by breathing from desired activities / low back limiting   Cough: gone  Sleeping: flat bed 2 pillows  SABA use: none  02: none rec Once the protonix (pantoprazole) runs out don't refill it but change pepcid  (famotidine)  to twice daily  - after breakfast and supper    08/31/2019  f/u ov/Wendy [name] re: cough comes and goes  since 2016 - initially reported cough never improved at all until shown ov notes from 05/24/19 "well if that's what it says - I don't remember it that way"  Chief Complaint  Patient presents with  . Follow-up    unchanged chronic cough   sob:  Only when having coughing fit Cough 24/7 harsh barking quality  "inhalers don't work"  - says took flovent 44 no better    No obvious patterns in  day to day or daytime variability  or assoc excess/ purulent sputum or mucus plugs or hemoptysis or cp or chest tightness, subjective wheeze or overt sinus or hb symptoms.     without nocturnal  or early am exacerbation  of respiratory  c/o's or need for noct saba. Also denies any obvious fluctuation of symptoms with weather or environmental changes or other aggravating or alleviating factors except as outlined above   No unusual exposure hx or h/o childhood pna/ asthma or knowledge of premature birth.  Current Allergies, Complete Past Medical History, Past Surgical History, Family History, and Social History were reviewed in [employer] record.  ROS  The following are not active complaints unless bolded Hoarseness, sore throat/globus , dysphagia, dental problems, itching, sneezing,   nasal congestion or discharge of excess mucus or purulent secretions, ear ache,   fever, chills, sweats, unintended wt loss or wt gain, classically pleuritic or exertional cp,  orthopnea pnd or arm/hand swelling  or leg swelling, presyncope, palpitations, abdominal pain, anorexia, nausea, vomiting, diarrhea  or change in bowel habits or change in bladder habits, change in stools or change in urine, dysuria, hematuria,  rash, arthralgias, visual complaints, headache, numbness, weakness or ataxia or problems with walking or coordination,  change in mood or  memory.        Current Meds  Medication Sig  . amLODipine (NORVASC) 10 MG tablet Take 1 tablet (10 mg total) by mouth daily.  . ASPIRIN LOW DOSE 81 MG EC tablet TAKE 1 TABLET BY MOUTH DAILY  . empagliflozin (JARDIANCE) 25 MG TABS tablet Take 1 tablet (25 mg total) by mouth daily before breakfast.  . famotidine (PEPCID) 20 MG tablet Take 1 tablet (20 mg total) by mouth 2 (two) times daily. One after supper  . fluticasone (FLOVENT HFA) 44 MCG/ACT inhaler Inhale 2 puffs into the lungs 2 (two) times daily.  Marland Kitchen levETIRAcetam (KEPPRA) 1000 MG tablet TAKE 1 TABLET BY MOUTH TWICE DAILY *PATIENT NEEDS APPOINTMENT FOR REFILLS*  . metFORMIN (GLUCOPHAGE) 1000 MG tablet TAKE 1 TABLET BY MOUTH TWICE DAILY WITH MEALS  . rosuvastatin (CRESTOR) 20 MG tablet Take 1 tablet (20 mg total) by mouth daily.   Current Facility-Administered Medications for the 08/31/19 encounter (Office Visit) with Tanda Rockers, MD  Medication  . lidocaine-EPINEPHrine (XYLOCAINE W/EPI) 1 %-1:100000 (with pres) injection 10 mL               Objective:  Physical Exam   amb bf/ very somber / harsh barking cough   08/31/2019        178 05/24/2019        177  05/09/2019        177  04/18/2017         170  01/17/2017         179  11/22/2016     179 10/15/2016       180   08/17/2016        174   07/23/16 177 lb (80.3 kg)  07/20/16 176 lb (79.8 kg)  06/28/16 169 lb 9.6 oz (76.9 kg)         Vital signs reviewed  08/31/2019  - Note at rest 02 sats  97% on RA    HEENT : pt wearing mask not removed for exam due to covid -19 concerns.    NECK :  without JVD/Nodes/TM/ nl carotid upstrokes bilaterally   LUNGS: no acc muscle use,  Nl contour chest which is clear to A and P bilaterally without cough  on insp or exp maneuvers   CV:  RRR  no s3 or murmur or increase in P2, and no edema   ABD:  soft and nontender with nl inspiratory excursion in the supine position. No bruits or organomegaly appreciated, bowel sounds nl  MS:  Nl gait/ ext warm without deformities, calf tenderness, cyanosis or clubbing No obvious joint restrictions   SKIN: warm and dry without lesions    NEURO:  alert,   nl sensorium with  no motor or cerebellar deficits apparent.            Assessment:

## 2019-09-01 ENCOUNTER — Encounter: Payer: Self-pay | Admitting: Internal Medicine

## 2019-09-01 NOTE — Assessment & Plan Note (Addendum)
Cough since 2016 made worse by acei use d/c'd in 07/2015  Allergy profile 07/23/2016 >  Eos 0.1 /  IgE  15 Rast neg - Sinus CT 08/02/2016 > Clear paranasal sinuses. - cyclical cough protocol 07/23/2016 and 08/17/2016 > non adherent - Spirometry 10/15/2016  Wnl/ very min curvature  - repeat cyclical cough protocol 10/15/2016 with Prednisone 10 mg take  4 each am x 2 days,   2 each am x 2 days,  1 each am x 2 days and stop and schedule MCT on gerd rx x 2 weeks then refer to [facility] if still coughing - MCT 10/25/16  >  POS for reversible airlfow obstruction s symptoms> started singulair / continued 1st gen H1 blockers per guidelines   - 04/18/17 rx pred/ restarted singulair and 1st gen H1 blockers per guidelines  And took tramadol from prior ov and reprots 100% elimination of cough  - 02/2019 relapsed cough off all rx for over a year s flare > restarted singulair/ gerd rx 05/09/19 plus 6 d prednisone - 05/24/19 cough improved but ? Did not continue meds ?    07/31/2019 - Cough returned, worse off H2 blocker/singulair. Adding flovent 48mcg 2 bid. > no better 08/31/2019 so d/c'd  - 08/31/2019 restarted max gerd / singulair, 1st gen H1 blockers per guidelines  And pred x 6 d plus phenergan dm prn and d/c'd all inhalers > return in 2 weeks with all meds in hand   Although the MCT is positive,not it did not make her cough so cough variant asthma may or may not be the dx vs Upper airway cough syndrome (previously labeled PNDS),  is so named because it's frequently impossible to sort out how much is  CR/sinusitis with freq throat clearing (which can be related to primary GERD)   vs  causing  secondary (" extra esophageal")  GERD from wide swings in gastric pressure that occur with throat clearing, often  promoting self use of mint and menthol lozenges that reduce the lower esophageal sphincter tone and exacerbate the problem further in a cyclical fashion.   These are the same pts (now being labeled as having "irritable larynx  syndrome" by some cough centers) who not infrequently have a history of having failed to tolerate ace inhibitors(as is the case here) ,  dry powder inhalers or biphosphonates or report having atypical/extraesophageal reflux symptoms that don't respond to standard doses of PPI (which may be the case here)  and are easily confused as having aecopd or asthma flares by even experienced allergists/ pulmonologists (myself included).   rec restart singulair 10 mg daily as maint rx indefinitely as may have helped in past and won't make her cough    Of the three most common causes of  Sub-acute / recurrent or chronic cough, only one (GERD)  can actually contribute to/ trigger  the other two (asthma and post nasal drip syndrome)  and perpetuate the cylce of cough.  While not intuitively obvious, many patients with chronic low grade reflux do not cough until there is a primary insult that disturbs the protective epithelial barrier and exposes sensitive nerve endings.   This is typically viral but can due to PNDS and  either may apply here.   The point is that once this occurs, it is difficult to eliminate the cycle  using anything but a maximally effective acid suppression regimen at least in the short run, accompanied by an appropriate diet to address non acid GERD and control /  eliminate the cough itself for at least 3 days with phenergan dm and eliminate pnds with 1st gen H1 blockers per guidelines  And  also added 6 days of Prednisone in case of component of Th-2 driven upper or lower airways inflammation (if cough responds short term only to relapse befor return while will on rx for uacs that would point to allergic rhinitis/ asthma or eos bronchitis)    Reviewed: The standardized cough guidelines published in Chest by Lissa Morales in 2006 are still the best available and consist of a multiple step process (up to 12!) , not a single office visit,  and are intended  to address this problem logically,  with an  alogrithm dependent on response to empiric treatment at  each progressive step  to determine a specific diagnosis with  minimal addtional testing needed. Therefore if adherence is an issue or can't be accurately verified,  it's very unlikely the standard evaluation and treatment will be successful here.    Furthermore, response to therapy (other than acute cough suppression, which should only be used short term with avoidance of narcotic containing cough syrups if possible), can be a gradual process for which the patient is not likely to  perceive immediate benefit.  Unlike going to an eye doctor where the best perscription is almost always the first one and is immediately effective, this is almost never the case in the management of chronic cough syndromes. Therefore the patient needs to commit up front to consistently adhere to recommendations  for up to 6 weeks of therapy directed at the likely underlying problem(s) before the response can be reasonably evaluated.    >>>> return in 2 weeks with all meds in hand using a trust but verify approach to confirm accurate Medication  Reconciliation The principal here is that until we are certain that the  patients are doing what we've asked, it makes no sense to ask them to do more.   To keep things simple, I have asked the patient to first separate medicines that are perceived as maintenance, that is to be taken daily "no matter what", from those medicines that are taken on only on an as-needed basis and I have given the patient examples of both, and then return to see our NP with meds in 2 separate bags to be sure she understood the concept.  If not improving needs trial of gabapentin or referral to Dr Joya Gaskins @ [facility] decision making was a moderate level of complexity in this case because of  two chronic conditions /diagnoses (asthma and uacs)  requiring extra time for  H and P, chart review, counseling,    and generating customized AVS unique to  this office visit and charting.   Each maintenance medication was reviewed in detail including emphasizing most importantly the difference between maintenance and prns and under what circumstances the prns are to be triggered using an action plan format where appropriate. Please see avs for details which were reviewed in writing by both me and my nurse and patient given a written copy highlighted where appropriate with yellow highlighter for the patient's continued care at home along with an updated version of their medications.  Patient was asked to maintain medication reconciliation by comparing this list to the actual medications being used at home and to contact this office right away if there is a conflict or discrepancy.

## 2019-09-07 ENCOUNTER — Telehealth: Payer: Self-pay

## 2019-09-07 DIAGNOSIS — I10 Essential (primary) hypertension: Secondary | ICD-10-CM

## 2019-09-07 DIAGNOSIS — E7849 Other hyperlipidemia: Secondary | ICD-10-CM

## 2019-09-07 NOTE — Progress Notes (Addendum)
Chronic Care Management Pharmacy Assistant   Name: Wendy [name]  MRN: [id number] DOB: [birth year]  Reason for Encounter: Medication Review   PCP : [name], MD  Allergies:   Allergies  Allergen Reactions  . Hydrocodone-Ibuprofen     Unknown to pt  . Lisinopril Cough  . Vicodin [Hydrocodone-Acetaminophen] Palpitations    Pt states she can take tylenol    Medications: Outpatient Encounter Medications as of 09/07/2019  Medication Sig  . amLODipine (NORVASC) 10 MG tablet Take 1 tablet (10 mg total) by mouth daily.  . ASPIRIN LOW DOSE 81 MG EC tablet TAKE 1 TABLET BY MOUTH DAILY  . empagliflozin (JARDIANCE) 25 MG TABS tablet Take 1 tablet (25 mg total) by mouth daily before breakfast.  . famotidine (PEPCID) 20 MG tablet One an hour before bedtime  . levETIRAcetam (KEPPRA) 1000 MG tablet TAKE 1 TABLET BY MOUTH TWICE DAILY *PATIENT NEEDS APPOINTMENT FOR REFILLS*  . metFORMIN (GLUCOPHAGE) 1000 MG tablet TAKE 1 TABLET BY MOUTH TWICE DAILY WITH MEALS  . montelukast (SINGULAIR) 10 MG tablet Take 1 tablet (10 mg total) by mouth at bedtime.  . pantoprazole (PROTONIX) 40 MG tablet Take 1 tablet (40 mg total) by mouth daily. Take 30-60 min before first meal of the day  . predniSONE (DELTASONE) 10 MG tablet Take  4 each am x 2 days,   2 each am x 2 days,  1 each am x 2 days and stop  . promethazine-dextromethorphan (PROMETHAZINE-DM) 6.25-15 MG/5ML syrup Take 5 mLs by mouth 4 (four) times daily as needed for cough.  . rosuvastatin (CRESTOR) 20 MG tablet Take 1 tablet (20 mg total) by mouth daily.   Facility-Administered Encounter Medications as of 09/07/2019  Medication  . lidocaine-EPINEPHrine (XYLOCAINE W/EPI) 1 %-1:100000 (with pres) injection 10 mL    Current Diagnosis: Patient Active Problem List   Diagnosis Date Noted  . Non-compliant patient 07/31/2019  . Aspiration pneumonia of both lower lobes ([place]) 05/07/2019  . Epilepsy ([place]) 05/07/2019  . Aortic atherosclerosis  ([place]) 10/05/2018  . Morbid obesity due to excess calories ([place]) 10/05/2018  . Abnormal EKG 09/14/2018  . Elevated LDL cholesterol level 09/14/2018  . Pre-operative clearance 09/14/2018  . Chronic left sacroiliac pain 08/07/2018  . Stenosis of left carotid artery 07/21/2018  . Diabetes mellitus due to underlying condition, uncontrolled, with hyperglycemia ([place]) 06/23/2018  . Ovarian cyst 08/20/2017  . Cough variant asthma with compoent UACS 11/24/2016  . Obesity (BMI 30-39.9) 07/24/2016  . Upper airway cough syndrome 07/23/2016  . History of loop recorder 07/20/2016  . Left carotid bruit 07/20/2016  . Hyperlipidemia 02/04/2016  . History of CVA (cerebrovascular accident) 02/04/2016  . Spastic hemiparesis of left dominant side ([place]) 02/04/2016  . Seizures ([place]) 02/04/2016  . Essential hypertension 04/21/2015  . Abdominal or pelvic swelling, mass or lump, unspecified site 04/21/2015  . Prediabetes 04/03/2015  . Cerebral artery occlusion with cerebral infarction ([place]) 03/22/2015    Goals Addressed   None     Follow-Up:  Pharmacist Review   Reviewed chart for medication changes ahead of medication coordination call.  OVs, Consults, or hospital visits since last care coordination call/Pharmacist visit.  08/28/2019 PCP Abelino Derrick  08/31/2019 Wert,Michael Pulmonology  Medication changes indicated   08/28/2019 Jardiance 25 MG tablet Daily  08/28/2019 Stop Zyrtec and tessalon and atovent  08/28/2019 started delsym, tramadol 50 mg, Prednisone 10 mg, Protonix (pantoprazole), Pepcid 20 mg ,chlorpheniramine 4 mg BP Readings from Last 3 Encounters:  08/31/19 120/68  07/31/19 110/70  07/31/19 106/68    Lab Results  Component Value Date   HGBA1C 10.1 (H) 08/01/2019     Patient obtains medications through Adherence Packaging  30 Days   Last adherence delivery included: (medication name and frequency)  Levetiracetam (Keppra) 1000 MG tablet twice daily - Breakfast,  Dinner  Empagliflozin (Jardiance) 10 mg Tablet daily - Breakfast  Montelukast (singular) 10 mg tablet daily - Bedtime  Patient declined (meds) last month due to PRN use/additional supply on hand.    Amlodipine,aspirin,pepcid,metformin,rosvastatin - Patient states she has adequate supply from last delivery on 07/24/19. Patient denies missed doses or adverse events.   Patient is due for next adherence delivery on: 09/14/2019. Called patient and reviewed medications and coordinated delivery.  This delivery to include:   Montelukast (singular) 10 mg tablet daily - Bedtime  Metformin 1000 mg tablet twice daily- breakfast and bedtime  Rosuvastatin 20 mg tablet daily - Breakfast  Jardiance 25 mg - Breakfast   Pantoprazole 40 mg daily - Breakfast     Patient is requesting new Rx for prednisone and promethazine. She will reach out to Pulmonology to inform them.   Patient declined the following medications (meds) due to (reason)  Pepcid 20 mg tablet (adquate supply)  Amlodipine- Patient states she has ~30 tablets remaining  Levetiracetam- Patient states she has >30 days remaining from previous vials fill.   Confirmed delivery date of 09/14/2019, advised patient that pharmacy will contact them the morning of delivery.  [place] Pharmacist Assistant [phone]

## 2019-09-14 ENCOUNTER — Ambulatory Visit: Payer: Medicare Other | Admitting: Primary Care

## 2019-09-14 NOTE — Progress Notes (Deleted)
@Patient  ID: Wendy [name], female    DOB: 1955/02/22, 64 y.o.   MRN: [id number]  No chief complaint on file.   Referring provider: [name]  HPI:  64 year old female, former light smoker quit in 2013 (2.5-pack-year history).  Past medical history significant for cough variant asthma.  Patient of Dr. Melvyn Novas, last seen in office on May 24, 2019.  Patient called office in late June with reports of worsening cough, requesting refill tramadol (last filled September 2020).   Previous [facility] pulmonary encounters:     Cough since 2016 made worse by acei use d/c'd in 07/2015  Allergy profile 07/23/2016 >  Eos 0.1 /  IgE  15 Rast neg - Sinus CT 08/02/2016 > Clear paranasal sinuses. - cyclical cough protocol 07/23/2016 and 08/17/2016 > non adherent - Spirometry 10/15/2016  Wnl/ very min curvature  - repeat cyclical cough protocol 10/15/2016 with Prednisone 10 mg take  4 each am x 2 days,   2 each am x 2 days,  1 each am x 2 days and stop and schedule MCT on gerd rx x 2 weeks then refer to [place] if still coughing - MCT 10/25/16  >  POS for reversible airlfow obstruction s symptoms> started singulair / continued 1st gen H1 blockers per guidelines   - 04/18/17 rx pred/ restarted singulair and 1st gen H1 blockers per guidelines  And took tramadol from prior ov and reprots 100% elimination of cough  - 02/2019 relapse cough off all rx for over a year s flare > restarted singulair/ gerd rx 05/09/19 plus 6 d prednisone      07/31/2019 Patient presents today for follow-up Cough. Patient experienced relapse of cough in February 2021 off of all Rx from previous year, patient was restarted on Singulair/famotidine twice daily + 6 days of prednisone.  Patient states that her cough improved end of 2020, started back in February 2021. She continues to have dry cough that is very debilitating. She ran out of Famotidine and Singulair a couple of weeks ago. She has some associated chest tightness. She has responsed well  to oral prednisone in the past. She does not want to take tramadol. Denies fever, chills or shortness of breath.   08/31/19- Dr. Melvyn Novas Pred taper Singulair  Phenergan DM Protonic and Pepcid   09/14/2019 - Interim  Patient presents today for 2 week follow-up.        Allergies  Allergen Reactions  . Hydrocodone-Ibuprofen     Unknown to pt  . Lisinopril Cough  . Vicodin [Hydrocodone-Acetaminophen] Palpitations    Pt states she can take tylenol    Immunization History  Administered Date(s) Administered  . Fluad Quad(high Dose 65+) 09/14/2018  . Influenza Split 08/17/2016  . Influenza,inj,Quad PF,6+ Mos 07/10/1917, 09/27/2017  . Influenza-Unspecified 09/19/2018  . PFIZER SARS-COV-2 Vaccination 01/30/2019, 02/21/2019  . Pneumococcal Polysaccharide-23 05/20/2016    Past Medical History:  Diagnosis Date  . Blood transfusion without reported diagnosis   . Chicken pox   . Cough   . Depression   . Helicobacter pylori gastritis    treated 05/2017  . History of loop recorder    3 years ago- in [place]. at [facility]  . Hyperlipidemia   . Hypertension   . Left-sided weakness   . Stroke ([place]) 03/2015 and 04/2015   Pt had 2 strokes/weakness on the left side  . Type 2 diabetes mellitus ([place])    Patient states she is a boader line diabetic    Tobacco History:  Social History   Tobacco Use  Smoking Status Former Smoker  . Packs/day: 0.25  . Years: 10.00  . Pack years: 2.50  . Types: Cigarettes  . Quit date: 01/12/2011  . Years since quitting: 8.6  Smokeless Tobacco Never Used   Counseling given: Not Answered   Outpatient Medications Prior to Visit  Medication Sig Dispense Refill  . amLODipine (NORVASC) 10 MG tablet Take 1 tablet (10 mg total) by mouth daily. 30 tablet 1  . ASPIRIN LOW DOSE 81 MG EC tablet TAKE 1 TABLET BY MOUTH DAILY 30 tablet 2  . empagliflozin (JARDIANCE) 25 MG TABS tablet Take 1 tablet (25 mg total) by mouth daily before breakfast. 30 tablet 2    . famotidine (PEPCID) 20 MG tablet One an hour before bedtime    . levETIRAcetam (KEPPRA) 1000 MG tablet TAKE 1 TABLET BY MOUTH TWICE DAILY *PATIENT NEEDS APPOINTMENT FOR REFILLS* 60 tablet 5  . metFORMIN (GLUCOPHAGE) 1000 MG tablet TAKE 1 TABLET BY MOUTH TWICE DAILY WITH MEALS 60 tablet 11  . montelukast (SINGULAIR) 10 MG tablet Take 1 tablet (10 mg total) by mouth at bedtime. 30 tablet 11  . pantoprazole (PROTONIX) 40 MG tablet Take 1 tablet (40 mg total) by mouth daily. Take 30-60 min before first meal of the day 30 tablet 2  . predniSONE (DELTASONE) 10 MG tablet Take  4 each am x 2 days,   2 each am x 2 days,  1 each am x 2 days and stop 14 tablet 0  . promethazine-dextromethorphan (PROMETHAZINE-DM) 6.25-15 MG/5ML syrup Take 5 mLs by mouth 4 (four) times daily as needed for cough. 180 mL 0  . rosuvastatin (CRESTOR) 20 MG tablet Take 1 tablet (20 mg total) by mouth daily. 30 tablet 1   Facility-Administered Medications Prior to Visit  Medication Dose Route Frequency Provider Last Rate Last Admin  . lidocaine-EPINEPHrine (XYLOCAINE W/EPI) 1 %-1:100000 (with pres) injection 10 mL  10 mL Infiltration Once Croitoru, Mihai, MD          Review of Systems  Review of Systems   Physical Exam  There were no vitals taken for this visit. Physical Exam   Lab Results:  CBC    Component Value Date/Time   WBC 7.8 08/01/2019 1039   RBC 5.58 (H) 08/01/2019 1039   HGB 15.1 (H) 08/01/2019 1039   HCT 44.0 08/01/2019 1039   PLT 228.0 08/01/2019 1039   MCV 78.8 08/01/2019 1039   MCH 27.2 09/20/2018 1541   MCHC 34.3 08/01/2019 1039   RDW 13.6 08/01/2019 1039   LYMPHSABS 2.1 07/23/2016 1555   MONOABS 0.6 07/23/2016 1555   EOSABS 0.1 07/23/2016 1555   BASOSABS 0.0 07/23/2016 1555    BMET    Component Value Date/Time   NA 137 08/01/2019 1039   NA 141 09/09/2015 0000   K 4.1 08/01/2019 1039   CL 101 08/01/2019 1039   CO2 28 08/01/2019 1039   GLUCOSE 162 (H) 08/01/2019 1039   BUN 14  08/01/2019 1039   CREATININE 0.96 08/01/2019 1039   CALCIUM 9.5 08/01/2019 1039   GFRNONAA >60 09/20/2018 1541   GFRAA >60 09/20/2018 1541    BNP No results found for: BNP  ProBNP No results found for: PROBNP  Imaging: No results found.   Assessment & Plan:   No problem-specific Assessment & Plan notes found for this encounter.     Martyn Ehrich, NP 09/14/2019

## 2019-09-26 ENCOUNTER — Ambulatory Visit: Payer: Medicare Other | Admitting: Primary Care

## 2019-09-26 NOTE — Progress Notes (Deleted)
@Patient  ID: Wendy [name], female    DOB: March 10, 1955, 64 y.o.   MRN: [id number]  No chief complaint on file.   Referring provider: Libby [name]  HPI: 64 year old female, former light smoker quit in 2013 (2.5-pack-year history).  Past medical history significant for cough variant asthma.  Patient of Wendy [name], last seen in office on May 24, 2019.  Patient called office in late June with reports of worsening cough, requesting refill tramadol (last filled September 2020).   Previous [facility] pulmonary encounters: Cough since 2016 made worse by acei use d/c'd in 07/2015  Allergy profile 07/23/2016 >  Eos 0.1 /  IgE  15 Rast neg - Sinus CT 08/02/2016 > Clear paranasal sinuses. - cyclical cough protocol 07/23/2016 and 08/17/2016 > non adherent - Spirometry 10/15/2016  Wnl/ very min curvature  - repeat cyclical cough protocol 10/15/2016 with Prednisone 10 mg take  4 each am x 2 days,   2 each am x 2 days,  1 each am x 2 days and stop and schedule MCT on gerd rx x 2 weeks then refer to [place] if still coughing - MCT 10/25/16  >  POS for reversible airlfow obstruction s symptoms> started singulair / continued 1st gen H1 blockers per guidelines   - 04/18/17 rx pred/ restarted singulair and 1st gen H1 blockers per guidelines  And took tramadol from prior ov and reprots 100% elimination of cough  - 02/2019 relapse cough off all rx for over a year s flare > restarted singulair/ gerd rx 05/09/19 plus 6 d prednisone  7/20/2021[name], NP Patient presents today for follow-up Cough. Patient experienced relapse of cough in February 2021 off of all Rx from previous year, patient was restarted on Singulair/famotidine twice daily + 6 days of prednisone.  Patient states that her cough improved end of 2020, started back in February 2021. She continues to have dry cough that is very debilitating. She ran out of Famotidine and Singulair a couple of weeks ago. She has some associated chest tightness. She has responsed well to  oral prednisone in the past. She does not want to take tramadol. Denies fever, chills or shortness of breath.   08/31/19- Wendy [name] 08/31/2019  f/u ov/Wert re: cough comes and goes  since 2016 - initially reported cough never improved at all until shown ov notes from 05/24/19 "well if that's what it says - I don't remember it that way"   CC: cough, unchanged sob:  Only when having coughing fit Cough 24/7 harsh barking quality  "inhalers don't work"  - says took flovent 44 no better  Rec restart singulair 10 mg daily as maint rx indefinitely as may have helped in past and won't make her cough  Prednisone 10 mg take  4 each am x 2 days,  2 each am x 2 days,  1 each am x 2 days and stop   Singulair (montelukast) 10 mg one daily > resume this as maintenance daily medication   For cough > phenergan dm a tsp every 4 hours as needed  Protonix (pantoprazole) Take 30-60 min before first meal of the day and Pepcid 20 mg one bedtime plus chlortabs chlorpheniramine 4 mg x 2  One hour before  bedtime (both available over the counter)  until cough is completely gone for at least a week without the need for cough suppression   09/26/2019- Interim hx Patient presents today for 2-4 week follow-up      Allergies  Allergen Reactions  .  Hydrocodone-Ibuprofen     Unknown to pt  . Lisinopril Cough  . Vicodin [Hydrocodone-Acetaminophen] Palpitations    Pt states she can take tylenol    Immunization History  Administered Date(s) Administered  . Fluad Quad(high Dose 65+) 09/14/2018  . Influenza Split 08/17/2016  . Influenza,inj,Quad PF,6+ Mos 07/10/1917, 09/27/2017  . Influenza-Unspecified 09/19/2018  . PFIZER SARS-COV-2 Vaccination 01/30/2019, 02/21/2019  . Pneumococcal Polysaccharide-23 05/20/2016    Past Medical History:  Diagnosis Date  . Blood transfusion without reported diagnosis   . Chicken pox   . Cough   . Depression   . Helicobacter pylori gastritis    treated 05/2017  . History  of loop recorder    3 years ago- in [place]. at [facility]  . Hyperlipidemia   . Hypertension   . Left-sided weakness   . Stroke ([place]) 03/2015 and 04/2015   Pt had 2 strokes/weakness on the left side  . Type 2 diabetes mellitus ([place])    Patient states she is a boader line diabetic    Tobacco History: Social History   Tobacco Use  Smoking Status Former Smoker  . Packs/day: 0.25  . Years: 10.00  . Pack years: 2.50  . Types: Cigarettes  . Quit date: 01/12/2011  . Years since quitting: 8.7  Smokeless Tobacco Never Used   Counseling given: Not Answered   Outpatient Medications Prior to Visit  Medication Sig Dispense Refill  . amLODipine (NORVASC) 10 MG tablet Take 1 tablet (10 mg total) by mouth daily. 30 tablet 1  . ASPIRIN LOW DOSE 81 MG EC tablet TAKE 1 TABLET BY MOUTH DAILY 30 tablet 2  . empagliflozin (JARDIANCE) 25 MG TABS tablet Take 1 tablet (25 mg total) by mouth daily before breakfast. 30 tablet 2  . famotidine (PEPCID) 20 MG tablet One an hour before bedtime    . levETIRAcetam (KEPPRA) 1000 MG tablet TAKE 1 TABLET BY MOUTH TWICE DAILY *PATIENT NEEDS APPOINTMENT FOR REFILLS* 60 tablet 5  . metFORMIN (GLUCOPHAGE) 1000 MG tablet TAKE 1 TABLET BY MOUTH TWICE DAILY WITH MEALS 60 tablet 11  . montelukast (SINGULAIR) 10 MG tablet Take 1 tablet (10 mg total) by mouth at bedtime. 30 tablet 11  . pantoprazole (PROTONIX) 40 MG tablet Take 1 tablet (40 mg total) by mouth daily. Take 30-60 min before first meal of the day 30 tablet 2  . predniSONE (DELTASONE) 10 MG tablet Take  4 each am x 2 days,   2 each am x 2 days,  1 each am x 2 days and stop 14 tablet 0  . promethazine-dextromethorphan (PROMETHAZINE-DM) 6.25-15 MG/5ML syrup Take 5 mLs by mouth 4 (four) times daily as needed for cough. 180 mL 0  . rosuvastatin (CRESTOR) 20 MG tablet Take 1 tablet (20 mg total) by mouth daily. 30 tablet 1   Facility-Administered Medications Prior to Visit  Medication Dose Route Frequency  Provider Last Rate Last Admin  . lidocaine-EPINEPHrine (XYLOCAINE W/EPI) 1 %-1:100000 (with pres) injection 10 mL  10 mL Infiltration Once [name], Mihai, MD          Review of Systems  Review of Systems   Physical Exam  There were no vitals taken for this visit. Physical Exam   Lab Results:  CBC    Component Value Date/Time   WBC 7.8 08/01/2019 1039   RBC 5.58 (H) 08/01/2019 1039   HGB 15.1 (H) 08/01/2019 1039   HCT 44.0 08/01/2019 1039   PLT 228.0 08/01/2019 1039   MCV 78.8 08/01/2019  1039   MCH 27.2 09/20/2018 1541   MCHC 34.3 08/01/2019 1039   RDW 13.6 08/01/2019 1039   LYMPHSABS 2.1 07/23/2016 1555   MONOABS 0.6 07/23/2016 1555   EOSABS 0.1 07/23/2016 1555   BASOSABS 0.0 07/23/2016 1555    BMET    Component Value Date/Time   NA 137 08/01/2019 1039   NA 141 09/09/2015 0000   K 4.1 08/01/2019 1039   CL 101 08/01/2019 1039   CO2 28 08/01/2019 1039   GLUCOSE 162 (H) 08/01/2019 1039   BUN 14 08/01/2019 1039   CREATININE 0.96 08/01/2019 1039   CALCIUM 9.5 08/01/2019 1039   GFRNONAA >60 09/20/2018 1541   GFRAA >60 09/20/2018 1541    BNP No results found for: BNP  ProBNP No results found for: PROBNP  Imaging: No results found.   Assessment & Plan:   No problem-specific Assessment & Plan notes found for this encounter.     Martyn Ehrich, NP 09/26/2019

## 2019-10-04 ENCOUNTER — Ambulatory Visit: Payer: Medicare Other | Admitting: Pulmonary Disease

## 2019-10-08 ENCOUNTER — Ambulatory Visit: Payer: Medicare Other | Admitting: Pulmonary Disease

## 2019-10-08 ENCOUNTER — Telehealth: Payer: Self-pay | Admitting: Family Medicine

## 2019-10-08 NOTE — Telephone Encounter (Signed)
Spoke with patient she request I call her back this afternoon she was in [employer]

## 2019-10-09 ENCOUNTER — Telehealth: Payer: Self-pay

## 2019-10-09 NOTE — Progress Notes (Signed)
Chronic Care Management Pharmacy Assistant   Name: Wendy [name]  MRN: [id number] DOB: September 17, 1955  Reason for Encounter: Medication Review   PCP : [name], MD  Allergies:   Allergies  Allergen Reactions  . Hydrocodone-Ibuprofen     Unknown to pt  . Lisinopril Cough  . Vicodin [Hydrocodone-Acetaminophen] Palpitations    Pt states she can take tylenol    Medications: Outpatient Encounter Medications as of 10/09/2019  Medication Sig  . amLODipine (NORVASC) 10 MG tablet Take 1 tablet (10 mg total) by mouth daily.  . ASPIRIN LOW DOSE 81 MG EC tablet TAKE 1 TABLET BY MOUTH DAILY  . empagliflozin (JARDIANCE) 25 MG TABS tablet Take 1 tablet (25 mg total) by mouth daily before breakfast.  . famotidine (PEPCID) 20 MG tablet One an hour before bedtime  . levETIRAcetam (KEPPRA) 1000 MG tablet TAKE 1 TABLET BY MOUTH TWICE DAILY *PATIENT NEEDS APPOINTMENT FOR REFILLS*  . metFORMIN (GLUCOPHAGE) 1000 MG tablet TAKE 1 TABLET BY MOUTH TWICE DAILY WITH MEALS  . montelukast (SINGULAIR) 10 MG tablet Take 1 tablet (10 mg total) by mouth at bedtime.  . pantoprazole (PROTONIX) 40 MG tablet Take 1 tablet (40 mg total) by mouth daily. Take 30-60 min before first meal of the day  . predniSONE (DELTASONE) 10 MG tablet Take  4 each am x 2 days,   2 each am x 2 days,  1 each am x 2 days and stop  . promethazine-dextromethorphan (PROMETHAZINE-DM) 6.25-15 MG/5ML syrup Take 5 mLs by mouth 4 (four) times daily as needed for cough.  . rosuvastatin (CRESTOR) 20 MG tablet Take 1 tablet (20 mg total) by mouth daily.   Facility-Administered Encounter Medications as of 10/09/2019  Medication  . lidocaine-EPINEPHrine (XYLOCAINE W/EPI) 1 %-1:100000 (with pres) injection 10 mL    Current Diagnosis: Patient Active Problem List   Diagnosis Date Noted  . Non-compliant patient 07/31/2019  . Aspiration pneumonia of both lower lobes ([place]) 05/07/2019  . Epilepsy ([place]) 05/07/2019  . Aortic atherosclerosis  ([place]) 10/05/2018  . Morbid obesity due to excess calories ([place]) 10/05/2018  . Abnormal EKG 09/14/2018  . Elevated LDL cholesterol level 09/14/2018  . Pre-operative clearance 09/14/2018  . Chronic left sacroiliac pain 08/07/2018  . Stenosis of left carotid artery 07/21/2018  . Diabetes mellitus due to underlying condition, uncontrolled, with hyperglycemia ([place]) 06/23/2018  . Ovarian cyst 08/20/2017  . Cough variant asthma with compoent UACS 11/24/2016  . Obesity (BMI 30-39.9) 07/24/2016  . Upper airway cough syndrome 07/23/2016  . History of loop recorder 07/20/2016  . Left carotid bruit 07/20/2016  . Hyperlipidemia 02/04/2016  . History of CVA (cerebrovascular accident) 02/04/2016  . Spastic hemiparesis of left dominant side ([place]) 02/04/2016  . Seizures ([place]) 02/04/2016  . Essential hypertension 04/21/2015  . Abdominal or pelvic swelling, mass or lump, unspecified site 04/21/2015  . Prediabetes 04/03/2015  . Cerebral artery occlusion with cerebral infarction [facility]) 03/22/2015      Follow-Up:  Pharmacist Review   Reviewed chart for medication changes ahead of medication coordination call.  No OVs, Consults, or hospital visits since last care coordination call/Pharmacist visit.   No medication changes indicated   BP Readings from Last 3 Encounters:  08/31/19 120/68  07/31/19 110/70  07/31/19 106/68    Lab Results  Component Value Date   HGBA1C 10.1 (H) 08/01/2019     Patient obtains medications through Adherence Packaging  30 Days   Last adherence delivery included: (medication name and frequency)  Montelukast (singular) 10 mg tablet daily - Bedtime             Metformin 1000 mg tablet twice daily- breakfast and bedtime             Rosuvastatin 20 mg tablet daily - Breakfast             Jardiance 25 mg - Breakfast              Pantoprazole 40 mg daily - Breakfast    Patient declined (meds) last month due to PRN use/additional supply on hand.  Pepcid 20 mg tablet  (adquate supply)       Amlodipine- Patient states she has ~30 tablets remaining             Levetiracetam- Patient states she has >30 days remaining from previous vials fill.   Patient is due for next adherence delivery on: 10/16/2019. Called patient and reviewed medications and coordinated delivery.  This delivery to include:  Montelukast (singular) 10 mg tablet daily - Bedtime             Metformin 1000 mg tablet twice daily- breakfast and bedtime             Rosuvastatin 20 mg tablet daily - Breakfast             Jardiance 25 mg - Breakfast              Pantoprazole 40 mg daily - Breakfast    Pepcid 20 mg tablet Daily -Breakfast  Amlodipine 10 MG Tablet Daily-Breakfast  Levetiracetam 1000 MG Tablet Twice Daily- Breakfast, Bedtime  Aspirin 81 MG Daily -Breakfast  Patient declined the following medications (meds) due to (reason)  Prednisone 10 MG Tablet Daily (Patient not taking)  Promethazine  5ML (patient not taking)  Lidocaine (Not Taking)    Patient needs refills for None ID.  Confirmed delivery date of 10/16/2019, advised patient that pharmacy will contact them the morning of delivery.  [place] Pharmacist Assistant [phone]

## 2019-10-11 ENCOUNTER — Ambulatory Visit: Payer: Medicare Other | Admitting: Pulmonary Disease

## 2019-10-11 ENCOUNTER — Telehealth: Payer: Self-pay

## 2019-10-11 ENCOUNTER — Ambulatory Visit (INDEPENDENT_AMBULATORY_CARE_PROVIDER_SITE_OTHER): Payer: Medicare Other

## 2019-10-11 VITALS — Ht 64.0 in | Wt 173.0 lb

## 2019-10-11 DIAGNOSIS — Z0000 Encounter for general adult medical examination without abnormal findings: Secondary | ICD-10-CM

## 2019-10-11 DIAGNOSIS — Z780 Asymptomatic menopausal state: Secondary | ICD-10-CM

## 2019-10-11 DIAGNOSIS — Z1231 Encounter for screening mammogram for malignant neoplasm of breast: Secondary | ICD-10-CM | POA: Diagnosis not present

## 2019-10-11 NOTE — Telephone Encounter (Signed)
Patient wants to know if she can get an order for a Dexcom blood sugar monitor.

## 2019-10-11 NOTE — Telephone Encounter (Signed)
Okay to send 

## 2019-10-11 NOTE — Progress Notes (Deleted)
@Patient  ID: Wendy [name], female    DOB: 10-Dec-1955, 64 y.o.   MRN: [id number]  No chief complaint on file.   Referring provider: [name],*  HPI:   PMH:  Smoker/ Smoking History:  Maintenance:   Pt of:   10/11/2019  - Visit     Questionaires / Pulmonary Flowsheets:   ACT:  No flowsheet data found.  MMRC: No flowsheet data found.  Epworth:  No flowsheet data found.  Tests:   FENO:  No results found for: NITRICOXIDE  PFT: PFT Results Latest Ref Rng & Units 10/25/2016  FVC-Pre L 2.16  FVC-Predicted Pre % 83  FVC-Post L 2.05  FVC-Predicted Post % 79  Pre FEV1/FVC % % 77  Post FEV1/FCV % % 74  FEV1-Pre L 1.66  FEV1-Predicted Pre % 82  FEV1-Post L 1.52    WALK:  No flowsheet data found.  Imaging: No results found.  Lab Results:  CBC    Component Value Date/Time   WBC 7.8 08/01/2019 1039   RBC 5.58 (H) 08/01/2019 1039   HGB 15.1 (H) 08/01/2019 1039   HCT 44.0 08/01/2019 1039   PLT 228.0 08/01/2019 1039   MCV 78.8 08/01/2019 1039   MCH 27.2 09/20/2018 1541   MCHC 34.3 08/01/2019 1039   RDW 13.6 08/01/2019 1039   LYMPHSABS 2.1 07/23/2016 1555   MONOABS 0.6 07/23/2016 1555   EOSABS 0.1 07/23/2016 1555   BASOSABS 0.0 07/23/2016 1555    BMET    Component Value Date/Time   NA 137 08/01/2019 1039   NA 141 09/09/2015 0000   K 4.1 08/01/2019 1039   CL 101 08/01/2019 1039   CO2 28 08/01/2019 1039   GLUCOSE 162 (H) 08/01/2019 1039   BUN 14 08/01/2019 1039   CREATININE 0.96 08/01/2019 1039   CALCIUM 9.5 08/01/2019 1039   GFRNONAA >60 09/20/2018 1541   GFRAA >60 09/20/2018 1541    BNP No results found for: BNP  ProBNP No results found for: PROBNP  Specialty Problems      Pulmonary Problems   Upper airway cough syndrome    Cough since 2016 made worse by acei use d/c'd in 07/2015  Allergy profile 07/23/2016 >  Eos 0.1 /  IgE  15 Rast neg - Sinus CT 08/02/2016 >>> 1. Clear paranasal sinuses. - cyclical cough protocol  07/23/2016 and 08/17/2016 > non adherent - Spirometry 10/15/2016  Wnl/ very min curvature  - repeat cyclical cough protocol 10/15/2016 with Prednisone 10 mg take  4 each am x 2 days,   2 each am x 2 days,  1 each am x 2 days and stop and schedule MCT on gerd rx x 2 weeks then refer to [place] if still coughing - MCT 10/25/16  >  POS for reversible airlfow obstruction but no assoc symptoms> started singulair / continued 1st gen H1 blockers per guidelines  - 04/18/17 rx pred/ restarted singulair and 1st gen H1 blockers per guidelines  And took tramadol from prior ov and reports 100% elimination of cough  - 02/2019 relapse cough off all rx for over a year s flare       Cough variant asthma with compoent UACS    Cough since 2016 made worse by acei use d/c'd in 07/2015  Allergy profile 07/23/2016 >  Eos 0.1 /  IgE  15 Rast neg - Sinus CT 08/02/2016 > Clear paranasal sinuses. - cyclical cough protocol 07/23/2016 and 08/17/2016 > non adherent - Spirometry 10/15/2016  Wnl/ very min curvature  -  repeat cyclical cough protocol 10/15/2016 with Prednisone 10 mg take  4 each am x 2 days,   2 each am x 2 days,  1 each am x 2 days and stop and schedule MCT on gerd rx x 2 weeks then refer to [facility] if still coughing - MCT 10/25/16  >  POS for reversible airlfow obstruction s symptoms> started singulair / continued 1st gen H1 blockers per guidelines   - 04/18/17 rx pred/ restarted singulair and 1st gen H1 blockers per guidelines  And took tramadol from prior ov and reprots 100% elimination of cough  - 02/2019 relapse cough off all rx for over a year s flare > restarted singulair/ gerd rx 05/09/19 plus 6 d prednisone - 05/24/19 cough improved but ? Did not continue meds ?    07/31/2019 - Cough returned, worse off H2 blocker/singulair. Adding flovent 45mcg 2 bid. > no better 08/31/2019 so d/c'd  - 08/31/2019 restarted max gerd / singulair, 1st gen H1 blockers per guidelines  And pred x 6 d plus phenergan dm prn and d/c'd all inhalers > return in  2 weeks with all meds in hand       Aspiration pneumonia of both lower lobes (HCC)      Allergies  Allergen Reactions  . Hydrocodone-Ibuprofen     Unknown to pt  . Lisinopril Cough  . Vicodin [Hydrocodone-Acetaminophen] Palpitations    Pt states she can take tylenol    Immunization History  Administered Date(s) Administered  . Fluad Quad(high Dose 65+) 09/14/2018  . Influenza Split 08/17/2016  . Influenza,inj,Quad PF,6+ Mos 07/10/1917, 09/27/2017  . Influenza-Unspecified 09/19/2018  . PFIZER SARS-COV-2 Vaccination 01/30/2019, 02/21/2019  . Pneumococcal Polysaccharide-23 05/20/2016    Past Medical History:  Diagnosis Date  . Blood transfusion without reported diagnosis   . Chicken pox   . Cough   . Depression   . Helicobacter pylori gastritis    treated 05/2017  . History of loop recorder    3 years ago- in [place]. at [facility]  . Hyperlipidemia   . Hypertension   . Left-sided weakness   . Stroke ([place]) 03/2015 and 04/2015   Pt had 2 strokes/weakness on the left side  . Type 2 diabetes mellitus ([place])    Patient states she is a boader line diabetic    Tobacco History: Social History   Tobacco Use  Smoking Status Former Smoker  . Packs/day: 0.25  . Years: 10.00  . Pack years: 2.50  . Types: Cigarettes  . Quit date: 01/12/2011  . Years since quitting: 8.7  Smokeless Tobacco Never Used   Counseling given: Not Answered   Continue to not smoke  Outpatient Encounter Medications as of 10/11/2019  Medication Sig  . amLODipine (NORVASC) 10 MG tablet Take 1 tablet (10 mg total) by mouth daily.  . ASPIRIN LOW DOSE 81 MG EC tablet TAKE 1 TABLET BY MOUTH DAILY  . empagliflozin (JARDIANCE) 25 MG TABS tablet Take 1 tablet (25 mg total) by mouth daily before breakfast.  . famotidine (PEPCID) 20 MG tablet One an hour before bedtime  . levETIRAcetam (KEPPRA) 1000 MG tablet TAKE 1 TABLET BY MOUTH TWICE DAILY *PATIENT NEEDS APPOINTMENT FOR REFILLS*  . metFORMIN  (GLUCOPHAGE) 1000 MG tablet TAKE 1 TABLET BY MOUTH TWICE DAILY WITH MEALS  . montelukast (SINGULAIR) 10 MG tablet Take 1 tablet (10 mg total) by mouth at bedtime.  . pantoprazole (PROTONIX) 40 MG tablet Take 1 tablet (40 mg total) by mouth daily. Take 30-60  min before first meal of the day  . predniSONE (DELTASONE) 10 MG tablet Take  4 each am x 2 days,   2 each am x 2 days,  1 each am x 2 days and stop  . promethazine-dextromethorphan (PROMETHAZINE-DM) 6.25-15 MG/5ML syrup Take 5 mLs by mouth 4 (four) times daily as needed for cough.  . rosuvastatin (CRESTOR) 20 MG tablet Take 1 tablet (20 mg total) by mouth daily.   Facility-Administered Encounter Medications as of 10/11/2019  Medication  . lidocaine-EPINEPHrine (XYLOCAINE W/EPI) 1 %-1:100000 (with pres) injection 10 mL     Review of Systems  Review of Systems   Physical Exam  There were no vitals taken for this visit.  Wt Readings from Last 5 Encounters:  08/31/19 178 lb 6.4 oz (80.9 kg)  07/31/19 173 lb 6.4 oz (78.7 kg)  07/31/19 173 lb 3.2 oz (78.6 kg)  07/05/19 175 lb 3.2 oz (79.5 kg)  05/24/19 177 lb 3.2 oz (80.4 kg)    BMI Readings from Last 5 Encounters:  08/31/19 30.62 kg/m  08/28/19 29.76 kg/m  07/31/19 29.76 kg/m  07/31/19 29.73 kg/m  07/05/19 30.07 kg/m     Physical Exam    Assessment & Plan:   No problem-specific Assessment & Plan notes found for this encounter.    No follow-ups on file.   Lauraine Rinne, NP 10/11/2019   This appointment required *** minutes of patient care (this includes precharting, chart review, review of results, face-to-face care, etc.).

## 2019-10-11 NOTE — Patient Instructions (Signed)
Wendy [name] , Thank you for taking time to complete your Medicare Wellness Visit. I appreciate your ongoing commitment to your health goals. Please review the following plan we discussed and let me know if I can assist you in the future.   Screening recommendations/referrals: Colonoscopy: Completed 06/01/2017-Due-06/02/2022 Mammogram: Please call the office back if you would like to schedule. Bone Density:  Please call the office back if you would like to schedule. Recommended yearly ophthalmology/optometry visit for glaucoma screening and checkup Recommended yearly dental visit for hygiene and checkup  Vaccinations: Influenza vaccine: Due- May receive vaccine at our office or at your local pharmacy. Pneumococcal vaccine: Due-May receive vaccine at our office or at your local pharmacy. Tdap vaccine: May receive vaccine at our office or at your local pharmacy. Shingles vaccine: Discuss with pharmacy   Covid-19:Completed vaccines  Advanced directives: Information mailed today.  Conditions/risks identified: See problem list  Next appointment: Follow up in one year for your annual wellness visit    Preventive Care 65 Years and Older, Female Preventive care refers to lifestyle choices and visits with your health care provider that can promote health and wellness. What does preventive care include?  A yearly physical exam. This is also called an annual well check.  Dental exams once or twice a year.  Routine eye exams. Ask your health care provider how often you should have your eyes checked.  Personal lifestyle choices, including:  Daily care of your teeth and gums.  Regular physical activity.  Eating a healthy diet.  Avoiding tobacco and drug use.  Limiting alcohol use.  Practicing safe sex.  Taking low-dose aspirin every day.  Taking vitamin and mineral supplements as recommended by your health care provider. What happens during an annual well check? The services and  screenings done by your health care provider during your annual well check will depend on your age, overall health, lifestyle risk factors, and family history of disease. Counseling  Your health care provider may ask you questions about your:  Alcohol use.  Tobacco use.  Drug use.  Emotional well-being.  Home and relationship well-being.  Sexual activity.  Eating habits.  History of falls.  Memory and ability to understand (cognition).  Work and work Statistician.  Reproductive health. Screening  You may have the following tests or measurements:  Height, weight, and BMI.  Blood pressure.  Lipid and cholesterol levels. These may be checked every 5 years, or more frequently if you are over 23 years old.  Skin check.  Lung cancer screening. You may have this screening every year starting at age 39 if you have a 30-pack-year history of smoking and currently smoke or have quit within the past 15 years.  Fecal occult blood test (FOBT) of the stool. You may have this test every year starting at age 48.  Flexible sigmoidoscopy or colonoscopy. You may have a sigmoidoscopy every 5 years or a colonoscopy every 10 years starting at age 66.  Hepatitis C blood test.  Hepatitis B blood test.  Sexually transmitted disease (STD) testing.  Diabetes screening. This is done by checking your blood sugar (glucose) after you have not eaten for a while (fasting). You may have this done every 1-3 years.  Bone density scan. This is done to screen for osteoporosis. You may have this done starting at age 54.  Mammogram. This may be done every 1-2 years. Talk to your health care provider about how often you should have regular mammograms. Talk with your health care  provider about your test results, treatment options, and if necessary, the need for more tests. Vaccines  Your health care provider may recommend certain vaccines, such as:  Influenza vaccine. This is recommended every  year.  Tetanus, diphtheria, and acellular pertussis (Tdap, Td) vaccine. You may need a Td booster every 10 years.  Zoster vaccine. You may need this after age 22.  Pneumococcal 13-valent conjugate (PCV13) vaccine. One dose is recommended after age 31.  Pneumococcal polysaccharide (PPSV23) vaccine. One dose is recommended after age 64. Talk to your health care provider about which screenings and vaccines you need and how often you need them. This information is not intended to replace advice given to you by your health care provider. Make sure you discuss any questions you have with your health care provider. Document Released: 01/24/2015 Document Revised: 09/17/2015 Document Reviewed: 10/29/2014 Elsevier Interactive Patient Education  2017 [place] Prevention in the Home Falls can cause injuries. They can happen to people of all ages. There are many things you can do to make your home safe and to help prevent falls. What can I do on the outside of my home?  Regularly fix the edges of walkways and driveways and fix any cracks.  Remove anything that might make you trip as you walk through a door, such as a raised step or threshold.  Trim any bushes or trees on the path to your home.  Use bright outdoor lighting.  Clear any walking paths of anything that might make someone trip, such as rocks or tools.  Regularly check to see if handrails are loose or broken. Make sure that both sides of any steps have handrails.  Any raised decks and porches should have guardrails on the edges.  Have any leaves, snow, or ice cleared regularly.  Use sand or salt on walking paths during winter.  Clean up any spills in your garage right away. This includes oil or grease spills. What can I do in the bathroom?  Use night lights.  Install grab bars by the toilet and in the tub and shower. Do not use towel bars as grab bars.  Use non-skid mats or decals in the tub or shower.  If you  need to sit down in the shower, use a plastic, non-slip stool.  Keep the floor dry. Clean up any water that spills on the floor as soon as it happens.  Remove soap buildup in the tub or shower regularly.  Attach bath mats securely with double-sided non-slip rug tape.  Do not have throw rugs and other things on the floor that can make you trip. What can I do in the bedroom?  Use night lights.  Make sure that you have a light by your bed that is easy to reach.  Do not use any sheets or blankets that are too big for your bed. They should not hang down onto the floor.  Have a firm chair that has side arms. You can use this for support while you get dressed.  Do not have throw rugs and other things on the floor that can make you trip. What can I do in the kitchen?  Clean up any spills right away.  Avoid walking on wet floors.  Keep items that you use a lot in easy-to-reach places.  If you need to reach something above you, use a strong step stool that has a grab bar.  Keep electrical cords out of the way.  Do not use floor polish  or wax that makes floors slippery. If you must use wax, use non-skid floor wax.  Do not have throw rugs and other things on the floor that can make you trip. What can I do with my stairs?  Do not leave any items on the stairs.  Make sure that there are handrails on both sides of the stairs and use them. Fix handrails that are broken or loose. Make sure that handrails are as long as the stairways.  Check any carpeting to make sure that it is firmly attached to the stairs. Fix any carpet that is loose or worn.  Avoid having throw rugs at the top or bottom of the stairs. If you do have throw rugs, attach them to the floor with carpet tape.  Make sure that you have a light switch at the top of the stairs and the bottom of the stairs. If you do not have them, ask someone to add them for you. What else can I do to help prevent falls?  Wear shoes  that:  Do not have high heels.  Have rubber bottoms.  Are comfortable and fit you well.  Are closed at the toe. Do not wear sandals.  If you use a stepladder:  Make sure that it is fully opened. Do not climb a closed stepladder.  Make sure that both sides of the stepladder are locked into place.  Ask someone to hold it for you, if possible.  Clearly mark and make sure that you can see:  Any grab bars or handrails.  First and last steps.  Where the edge of each step is.  Use tools that help you move around (mobility aids) if they are needed. These include:  Canes.  Walkers.  Scooters.  Crutches.  Turn on the lights when you go into a dark area. Replace any light bulbs as soon as they burn out.  Set up your furniture so you have a clear path. Avoid moving your furniture around.  If any of your floors are uneven, fix them.  If there are any pets around you, be aware of where they are.  Review your medicines with your doctor. Some medicines can make you feel dizzy. This can increase your chance of falling. Ask your doctor what other things that you can do to help prevent falls. This information is not intended to replace advice given to you by your health care provider. Make sure you discuss any questions you have with your health care provider. Document Released: 10/24/2008 Document Revised: 06/05/2015 Document Reviewed: 02/01/2014 Elsevier Interactive Patient Education  2017 [employer].

## 2019-10-11 NOTE — Progress Notes (Signed)
° ° °  Chronic Care Management Pharmacy Assistant   Name: Wendy [name]  MRN: [id number] DOB: [birth year]  Reason for Encounter: Medication Review  PCP : [name], MD  Allergies:   Allergies  Allergen Reactions   Hydrocodone-Ibuprofen     Unknown to pt   Lisinopril Cough   Vicodin [Hydrocodone-Acetaminophen] Palpitations    Pt states she can take tylenol    Medications: Outpatient Encounter Medications as of 10/11/2019  Medication Sig   amLODipine (NORVASC) 10 MG tablet Take 1 tablet (10 mg total) by mouth daily.   ASPIRIN LOW DOSE 81 MG EC tablet TAKE 1 TABLET BY MOUTH DAILY   empagliflozin (JARDIANCE) 25 MG TABS tablet Take 1 tablet (25 mg total) by mouth daily before breakfast.   famotidine (PEPCID) 20 MG tablet One an hour before bedtime   levETIRAcetam (KEPPRA) 1000 MG tablet TAKE 1 TABLET BY MOUTH TWICE DAILY *PATIENT NEEDS APPOINTMENT FOR REFILLS*   metFORMIN (GLUCOPHAGE) 1000 MG tablet TAKE 1 TABLET BY MOUTH TWICE DAILY WITH MEALS   montelukast (SINGULAIR) 10 MG tablet Take 1 tablet (10 mg total) by mouth at bedtime.   pantoprazole (PROTONIX) 40 MG tablet Take 1 tablet (40 mg total) by mouth daily. Take 30-60 min before first meal of the day (Patient not taking: Reported on 10/11/2019)   rosuvastatin (CRESTOR) 20 MG tablet Take 1 tablet (20 mg total) by mouth daily.   Facility-Administered Encounter Medications as of 10/11/2019  Medication   lidocaine-EPINEPHrine (XYLOCAINE W/EPI) 1 %-1:100000 (with pres) injection 10 mL    Current Diagnosis: Patient Active Problem List   Diagnosis Date Noted   Non-compliant patient 07/31/2019   Aspiration pneumonia of both lower lobes ([place]) 05/07/2019   Epilepsy ([place]) 05/07/2019   Aortic atherosclerosis ([place]) 10/05/2018   Morbid obesity due to excess calories ([place]) 10/05/2018   Abnormal EKG 09/14/2018   Elevated LDL cholesterol level 09/14/2018   Pre-operative clearance 09/14/2018   Chronic left  sacroiliac pain 08/07/2018   Stenosis of left carotid artery 07/21/2018   Diabetes mellitus due to underlying condition, uncontrolled, with hyperglycemia ([place]) 06/23/2018   Ovarian cyst 08/20/2017   Cough variant asthma with compoent UACS 11/24/2016   Obesity (BMI 30-39.9) 07/24/2016   Upper airway cough syndrome 07/23/2016   History of loop recorder 07/20/2016   Left carotid bruit 07/20/2016   Hyperlipidemia 02/04/2016   History of CVA (cerebrovascular accident) 02/04/2016   Spastic hemiparesis of left dominant side ([place]) 02/04/2016   Seizures ([place]) 02/04/2016   Essential hypertension 04/21/2015   Abdominal or pelvic swelling, mass or lump, unspecified site 04/21/2015   Prediabetes 04/03/2015   Cerebral artery occlusion with cerebral infarction ([place]) 03/22/2015    Goals Addressed   None     Follow-Up:  Pharmacist Review   Completed patient medication cost analysis for current medication.Patient spends around 214.02 yearly on medication.  [facility] Pharmacist Assistant [phone]
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
## 2019-10-11 NOTE — Telephone Encounter (Signed)
Cristie Hem, Can you assist Ms Topel with getting a Dexcom meter. She says she thought she mentioned it to you during one of her visits with you but could not remember when.  Thanks, Jana Half

## 2019-10-12 NOTE — Addendum Note (Signed)
Addended by: Caroleen Hamman A on: 10/12/2019 12:27 PM   Modules accepted: Orders, SmartSet

## 2019-10-15 ENCOUNTER — Ambulatory Visit: Payer: Medicare Other | Admitting: Adult Health

## 2019-10-24 ENCOUNTER — Telehealth: Payer: Self-pay

## 2019-10-24 NOTE — Progress Notes (Signed)
Chronic Care Management Pharmacy Assistant   Name: Wendy [name]  MRN: [id number] DOB: [birth year]   Reason for Encounter: Medication Review   PCP : [name], MD  Allergies:   Allergies  Allergen Reactions  . Hydrocodone-Ibuprofen     Unknown to pt  . Lisinopril Cough  . Vicodin [Hydrocodone-Acetaminophen] Palpitations    Pt states she can take tylenol    Medications: Outpatient Encounter Medications as of 10/24/2019  Medication Sig  . amLODipine (NORVASC) 10 MG tablet Take 1 tablet (10 mg total) by mouth daily.  . ASPIRIN LOW DOSE 81 MG EC tablet TAKE 1 TABLET BY MOUTH DAILY  . empagliflozin (JARDIANCE) 25 MG TABS tablet Take 1 tablet (25 mg total) by mouth daily before breakfast.  . famotidine (PEPCID) 20 MG tablet One an hour before bedtime  . levETIRAcetam (KEPPRA) 1000 MG tablet TAKE 1 TABLET BY MOUTH TWICE DAILY *PATIENT NEEDS APPOINTMENT FOR REFILLS*  . metFORMIN (GLUCOPHAGE) 1000 MG tablet TAKE 1 TABLET BY MOUTH TWICE DAILY WITH MEALS  . montelukast (SINGULAIR) 10 MG tablet Take 1 tablet (10 mg total) by mouth at bedtime.  . pantoprazole (PROTONIX) 40 MG tablet Take 1 tablet (40 mg total) by mouth daily. Take 30-60 min before first meal of the day (Patient not taking: Reported on 10/11/2019)  . rosuvastatin (CRESTOR) 20 MG tablet Take 1 tablet (20 mg total) by mouth daily.   Facility-Administered Encounter Medications as of 10/24/2019  Medication  . lidocaine-EPINEPHrine (XYLOCAINE W/EPI) 1 %-1:100000 (with pres) injection 10 mL    Current Diagnosis: Patient Active Problem List   Diagnosis Date Noted  . Non-compliant patient 07/31/2019  . Aspiration pneumonia of both lower lobes ([place]) 05/07/2019  . Epilepsy ([place]) 05/07/2019  . Aortic atherosclerosis ([place]) 10/05/2018  . Morbid obesity due to excess calories ([place]) 10/05/2018  . Abnormal EKG 09/14/2018  . Elevated LDL cholesterol level 09/14/2018  . Pre-operative clearance 09/14/2018  . Chronic left  sacroiliac pain 08/07/2018  . Stenosis of left carotid artery 07/21/2018  . Diabetes mellitus due to underlying condition, uncontrolled, with hyperglycemia ([place]) 06/23/2018  . Ovarian cyst 08/20/2017  . Cough variant asthma with compoent UACS 11/24/2016  . Obesity (BMI 30-39.9) 07/24/2016  . Upper airway cough syndrome 07/23/2016  . History of loop recorder 07/20/2016  . Left carotid bruit 07/20/2016  . Hyperlipidemia 02/04/2016  . History of CVA (cerebrovascular accident) 02/04/2016  . Spastic hemiparesis of left dominant side ([place]) 02/04/2016  . Seizures ([place]) 02/04/2016  . Essential hypertension 04/21/2015  . Abdominal or pelvic swelling, mass or lump, unspecified site 04/21/2015  . Prediabetes 04/03/2015  . Cerebral artery occlusion with cerebral infarction ([place]) 03/22/2015    Goals Addressed   None     Follow-Up:  Pharmacist Review   Reviewed chart for medication changes ahead of medication coordination call.  No OVs, Consults, or hospital visits since last care coordination call/Pharmacist visit.  No medication changes indicated  BP Readings from Last 3 Encounters:  08/31/19 120/68  07/31/19 110/70  07/31/19 106/68    Lab Results  Component Value Date   HGBA1C 10.1 (H) 08/01/2019     Patient obtains medications through Adherence Packaging  30 Days   Last adherence delivery included: (medication name and frequency)  Montelukast (singular) 10 mg tablet daily - Bedtime Metformin 1000 mg tablet twice daily- breakfast and bedtime Rosuvastatin 20 mg tablet daily -Breakfast Jardiance 25 mg -Breakfast  Pantoprazole 40 mg daily -Breakfast  Pepcid 20 mg tablet Daily -Breakfast             Amlodipine 10 MG Tablet Daily-Breakfast             Levetiracetam 1000 MG Tablet Twice Daily- Breakfast, Bedtime             Aspirin 81 MG Daily -Breakfast  Patient declined (meds) last month due to PRN use/additional  supply on hand.  Prednisone 10 MG Tablet Daily (Patient not taking)             Promethazine  5ML (patient not taking)             Lidocaine (Not Taking)                 Patient is due for next adherence delivery on: 11/12/2019. Called patient and reviewed medications and coordinated delivery.  This delivery to include:  Montelukast (singular) 10 mg tablet daily - Bedtime Metformin 1000 mg tablet twice daily- breakfast and bedtime Rosuvastatin 20 mg tablet daily -Breakfast Jardiance 25 mg -Breakfast  Pantoprazole 40 mg daily -Breakfast              Pepcid 20 mg tablet Daily -Breakfast             Amlodipine 10 MG Tablet Daily-Breakfast             Levetiracetam 1000 MG Tablet Twice Daily- Breakfast, Bedtime             Aspirin 81 MG Daily -Breakfast  Patient declined the following medications (meds) due to (reason)        Lidocaine (Not Taking)                Patient needs refills for Rosuvastatin 20 mg tablet,Pantoprazole 40 mg daily,Jardiance 25 mg  .  Confirmed delivery date of 11/12/2019, advised patient that pharmacy will contact them the morning of delivery.  [place] Pharmacist Assistant [phone]

## 2019-10-25 ENCOUNTER — Ambulatory Visit: Payer: Medicare Other | Admitting: Adult Health

## 2019-10-26 ENCOUNTER — Ambulatory Visit: Payer: Medicare Other | Admitting: Adult Health

## 2019-10-29 ENCOUNTER — Other Ambulatory Visit: Payer: Self-pay

## 2019-10-29 ENCOUNTER — Encounter: Payer: Self-pay | Admitting: Pulmonary Disease

## 2019-10-29 ENCOUNTER — Ambulatory Visit (INDEPENDENT_AMBULATORY_CARE_PROVIDER_SITE_OTHER): Payer: Medicare Other | Admitting: Pulmonary Disease

## 2019-10-29 VITALS — BP 128/68 | HR 100 | Temp 97.6°F | Ht 64.0 in | Wt 173.6 lb

## 2019-10-29 DIAGNOSIS — R058 Other specified cough: Secondary | ICD-10-CM | POA: Diagnosis not present

## 2019-10-29 DIAGNOSIS — Z9119 Patient's noncompliance with other medical treatment and regimen: Secondary | ICD-10-CM | POA: Diagnosis not present

## 2019-10-29 DIAGNOSIS — J45991 Cough variant asthma: Secondary | ICD-10-CM | POA: Diagnosis not present

## 2019-10-29 DIAGNOSIS — Z91199 Patient's noncompliance with other medical treatment and regimen due to unspecified reason: Secondary | ICD-10-CM

## 2019-10-29 MED ORDER — BENZONATATE 200 MG PO CAPS: 200.0000 mg | ORAL_CAPSULE | Freq: Three times a day (TID) | ORAL | 1 refills | Status: DC | PRN

## 2019-10-29 MED ORDER — FAMOTIDINE 20 MG PO TABS: ORAL_TABLET | ORAL | 2 refills | Status: DC

## 2019-10-29 MED ORDER — PANTOPRAZOLE SODIUM 40 MG PO TBEC: 40.0000 mg | DELAYED_RELEASE_TABLET | Freq: Every day | ORAL | 2 refills | Status: DC

## 2019-10-29 MED ORDER — MONTELUKAST SODIUM 10 MG PO TABS: 10.0000 mg | ORAL_TABLET | Freq: Every day | ORAL | 11 refills | Status: AC

## 2019-10-29 NOTE — Assessment & Plan Note (Signed)
Patient had cough improvement while taking PPI, H2 blocker, leukotriene.  Unfortunately patient only took these for 1 month.  She did not contact our office and did not proceed to the pharmacy to get refills.  She was confused on how these were prescribed.  We reviewed this extensively today.  I have ensured that she has refills for these medications.

## 2019-10-29 NOTE — Assessment & Plan Note (Signed)
Plan: Restart Protonix Restart Pepcid Restart Singulair Start daily antihistamine Can start nasal saline rinses Can use Tessalon Perles as well as over-the-counter cough medicine such as Delsym for cough suppression Follow-up in 8 weeks

## 2019-10-29 NOTE — Assessment & Plan Note (Signed)
Plan: Restart Singulair Restart Protonix Restart Pepcid Follow-up in 8 weeks

## 2019-10-29 NOTE — Patient Instructions (Addendum)
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  . Goal is to not Cough or clear throat.  Marland Kitchen Avoid coughing or clearing throat by using:  o non-mint products/sugarless candy o Water o ice chips o Remember NO MINT PRODUCTS  . Medications to use:  o Mucinex DM 1-2 every 12 hrs or Delsym 2 tsp every 12 hrs for cough (These are Over the counter) o Tessalon Three times a day  As needed  Cough.  o Protonix 40 mg before breakfast o Pepcid 20mg  before dinner  o Start Zyrtec 10mg  at bedtime (Can use generic, this is over the counter) o Chlor tabs 4mg  2 at bedtime  for nasal drip until cough is 100% cough free. (this medication is over the counter)    Please start taking a daily antihistamine:  >>>choose one of: zyrtec, claritin, allegra, or xyzal  >>>these are over the counter medications  >>>can choose generic option  >>>take daily  >>>this medication helps with allergies, post nasal drip, and cough   Protonix 40 mg tablet  >>>Please take 1 tablet daily 15 minutes to 30 minutes before your first meal of the day as well as before your other medications >>>Try to take at the same time each day >>>take this medication daily   GERD management: >>>Avoid laying flat until 2 hours after meals >>>Elevate head of the bed including entire chest >>>Reduce size of meals and amount of fat, acid, spices, caffeine and sweets >>>If you are smoking, Please stop! >>>Decrease alcohol consumption >>>Work on maintaining a healthy weight with normal BMI     Follow Up:    Return in about 2 months (around 12/29/2019), or if symptoms worsen or fail to improve, for Follow up with Dr. Melvyn Novas.   Notification of test results are managed in the following manner: If there are  any recommendations or changes to the  plan of care discussed  in office today,  we will contact you and let you know what they are. If you do not hear from [place], then your results are normal and you can view them through your  MyChart account , or a letter will be sent to you. Thank you again for trusting [place] with your care  - Thank you, [facility]    It is flu season:   >>> Best ways to protect herself from the flu: Receive the yearly flu vaccine, practice good hand hygiene washing with soap and also using hand sanitizer when available, eat a nutritious meals, get adequate rest, hydrate appropriately       Please contact the office if your symptoms worsen or you have concerns that you are not improving.   Thank you for choosing [facility] for your healthcare, and for allowing [place] to partner with you on your healthcare journey. I am thankful to be able to provide care to you today.   Wyn Quaker FNP-C    Cough, Adult Coughing is a reflex that clears your throat and your airways (respiratory system). Coughing helps to heal and protect your lungs. It is normal to cough occasionally, but a cough that happens with other symptoms or lasts a long time may be a sign of a condition that needs treatment. An acute cough may only last 2-3 weeks, while a chronic cough may  last 8 or more weeks. Coughing is commonly caused by:  Infection of the respiratory systemby viruses or bacteria.  Breathing in substances that irritate your lungs.  Allergies.  Asthma.  Mucus that runs down the back of your throat (postnasal drip).  Smoking.  Acid backing up from the stomach into the esophagus (gastroesophageal reflux).  Certain medicines.  Chronic lung problems.  Other medical conditions such as heart failure or a blood clot in the lung (pulmonary embolism). Follow these instructions at home: Medicines  Take over-the-counter and prescription medicines only as told by your health care provider.  Talk with your health care provider before you  take a cough suppressant medicine. Lifestyle   Avoid cigarette smoke. Do not use any products that contain nicotine or tobacco, such as cigarettes, e-cigarettes, and chewing tobacco. If you need help quitting, ask your health care provider.  Drink enough fluid to keep your urine pale yellow.  Avoid caffeine.  Do not drink alcohol if your health care provider tells you not to drink. General instructions   Pay close attention to changes in your cough. Tell your health care provider about them.  Always cover your mouth when you cough.  Avoid things that make you cough, such as perfume, candles, cleaning products, or campfire or tobacco smoke.  If the air is dry, use a cool mist vaporizer or humidifier in your bedroom or your home to help loosen secretions.  If your cough is worse at night, try to sleep in a semi-upright position.  Rest as needed.  Keep all follow-up visits as told by your health care provider. This is important. Contact a health care provider if you:  Have new symptoms.  Cough up pus.  Have a cough that does not get better after 2-3 weeks or gets worse.  Cannot control your cough with cough suppressant medicines and you are losing sleep.  Have pain that gets worse or pain that is not helped with medicine.  Have a fever.  Have unexplained weight loss.  Have night sweats. Get help right away if:  You cough up blood.  You have difficulty breathing.  Your heartbeat is very fast. These symptoms may represent a serious problem that is an emergency. Do not wait to see if the symptoms will go away. Get medical help right away. Call your local emergency services (911 in the [place] Do not drive yourself to the hospital. Summary  Coughing is a reflex that clears your throat and your airways. It is normal to cough occasionally, but a cough that happens with other symptoms or lasts a long time may be a sign of a condition that needs treatment.  Take  over-the-counter and prescription medicines only as told by your health care provider.  Always cover your mouth when you cough.  Contact a health care provider if you have new symptoms or a cough that does not get better after 2-3 weeks or gets worse. This information is not intended to replace advice given to you by your health care provider. Make sure you discuss any questions you have with your health care provider. Document Revised: 01/16/2018 Document Reviewed: 01/16/2018 Elsevier Patient Education  [place].    Gastroesophageal Reflux Disease, Adult Gastroesophageal reflux (GER) happens when acid from the stomach flows up into the tube that connects the mouth and the stomach (esophagus). Normally, food travels down the esophagus and stays in the stomach to be digested. With GER, food and stomach acid sometimes move back up into  the esophagus. You may have a disease called gastroesophageal reflux disease (GERD) if the reflux:  Happens often.  Causes frequent or very bad symptoms.  Causes problems such as damage to the esophagus. When this happens, the esophagus becomes sore and swollen (inflamed). Over time, GERD can make small holes (ulcers) in the lining of the esophagus. What are the causes? This condition is caused by a problem with the muscle between the esophagus and the stomach. When this muscle is weak or not normal, it does not close properly to keep food and acid from coming back up from the stomach. The muscle can be weak because of:  Tobacco use.  Pregnancy.  Having a certain type of hernia (hiatal hernia).  Alcohol use.  Certain foods and drinks, such as coffee, chocolate, onions, and peppermint. What increases the risk? You are more likely to develop this condition if you:  Are overweight.  Have a disease that affects your connective tissue.  Use NSAID medicines. What are the signs or symptoms? Symptoms of this condition  include:  Heartburn.  Difficult or painful swallowing.  The feeling of having a lump in the throat.  A bitter taste in the mouth.  Bad breath.  Having a lot of saliva.  Having an upset or bloated stomach.  Belching.  Chest pain. Different conditions can cause chest pain. Make sure you see your doctor if you have chest pain.  Shortness of breath or noisy breathing (wheezing).  Ongoing (chronic) cough or a cough at night.  Wearing away of the surface of teeth (tooth enamel).  Weight loss. How is this treated? Treatment will depend on how bad your symptoms are. Your doctor may suggest:  Changes to your diet.  Medicine.  Surgery. Follow these instructions at home: Eating and drinking   Follow a diet as told by your doctor. You may need to avoid foods and drinks such as: ? Coffee and tea (with or without caffeine). ? Drinks that contain alcohol. ? Energy drinks and sports drinks. ? Bubbly (carbonated) drinks or sodas. ? Chocolate and cocoa. ? Peppermint and mint flavorings. ? Garlic and onions. ? Horseradish. ? Spicy and acidic foods. These include peppers, chili powder, curry powder, vinegar, hot sauces, and BBQ sauce. ? Citrus fruit juices and citrus fruits, such as oranges, lemons, and limes. ? Tomato-based foods. These include red sauce, chili, salsa, and pizza with red sauce. ? Fried and fatty foods. These include donuts, french fries, potato chips, and high-fat dressings. ? High-fat meats. These include hot dogs, rib eye steak, sausage, ham, and bacon. ? High-fat dairy items, such as whole milk, butter, and cream cheese.  Eat small meals often. Avoid eating large meals.  Avoid drinking large amounts of liquid with your meals.  Avoid eating meals during the 2-3 hours before bedtime.  Avoid lying down right after you eat.  Do not exercise right after you eat. Lifestyle   Do not use any products that contain nicotine or tobacco. These include  cigarettes, e-cigarettes, and chewing tobacco. If you need help quitting, ask your doctor.  Try to lower your stress. If you need help doing this, ask your doctor.  If you are overweight, lose an amount of weight that is healthy for you. Ask your doctor about a safe weight loss goal. General instructions  Pay attention to any changes in your symptoms.  Take over-the-counter and prescription medicines only as told by your doctor. Do not take aspirin, ibuprofen, or other NSAIDs unless your doctor  says it is okay.  Wear loose clothes. Do not wear anything tight around your waist.  Raise (elevate) the head of your bed about 6 inches (15 cm).  Avoid bending over if this makes your symptoms worse.  Keep all follow-up visits as told by your doctor. This is important. Contact a doctor if:  You have new symptoms.  You lose weight and you do not know why.  You have trouble swallowing or it hurts to swallow.  You have wheezing or a cough that keeps happening.  Your symptoms do not get better with treatment.  You have a hoarse voice. Get help right away if:  You have pain in your arms, neck, jaw, teeth, or back.  You feel sweaty, dizzy, or light-headed.  You have chest pain or shortness of breath.  You throw up (vomit) and your throw-up looks like blood or coffee grounds.  You pass out (faint).  Your poop (stool) is bloody or black.  You cannot swallow, drink, or eat. Summary  If a person has gastroesophageal reflux disease (GERD), food and stomach acid move back up into the esophagus and cause symptoms or problems such as damage to the esophagus.  Treatment will depend on how bad your symptoms are.  Follow a diet as told by your doctor.  Take all medicines only as told by your doctor. This information is not intended to replace advice given to you by your health care provider. Make sure you discuss any questions you have with your health care provider. Document Revised:  07/06/2017 Document Reviewed: 07/06/2017 Elsevier Patient Education  2020 [place] for Gastroesophageal Reflux Disease, Adult When you have gastroesophageal reflux disease (GERD), the foods you eat and your eating habits are very important. Choosing the right foods can help ease your discomfort. Think about working with a nutrition specialist (dietitian) to help you make good choices. What are tips for following this plan?  Meals  Choose healthy foods that are low in fat, such as fruits, vegetables, whole grains, low-fat dairy products, and lean meat, fish, and poultry.  Eat small meals often instead of 3 large meals a day. Eat your meals slowly, and in a place where you are relaxed. Avoid bending over or lying down until 2-3 hours after eating.  Avoid eating meals 2-3 hours before bed.  Avoid drinking a lot of liquid with meals.  Cook foods using methods other than frying. Bake, grill, or broil food instead.  Avoid or limit: ? Chocolate. ? Peppermint or spearmint. ? Alcohol. ? Pepper. ? Black and decaffeinated coffee. ? Black and decaffeinated tea. ? Bubbly (carbonated) soft drinks. ? Caffeinated energy drinks and soft drinks.  Limit high-fat foods such as: ? Fatty meat or fried foods. ? Whole milk, cream, butter, or ice cream. ? Nuts and nut butters. ? Pastries, donuts, and sweets made with butter or shortening.  Avoid foods that cause symptoms. These foods may be different for everyone. Common foods that cause symptoms include: ? Tomatoes. ? Oranges, lemons, and limes. ? Peppers. ? Spicy food. ? Onions and garlic. ? Vinegar. Lifestyle  Maintain a healthy weight. Ask your doctor what weight is healthy for you. If you need to lose weight, work with your doctor to do so safely.  Exercise for at least 30 minutes for 5 or more days each week, or as told by your doctor.  Wear loose-fitting clothes.  Do not smoke. If you need help quitting, ask  your doctor.  Sleep with the head of your bed higher than your feet. Use a wedge under the mattress or blocks under the bed frame to raise the head of the bed. Summary  When you have gastroesophageal reflux disease (GERD), food and lifestyle choices are very important in easing your symptoms.  Eat small meals often instead of 3 large meals a day. Eat your meals slowly, and in a place where you are relaxed.  Limit high-fat foods such as fatty meat or fried foods.  Avoid bending over or lying down until 2-3 hours after eating.  Avoid peppermint and spearmint, caffeine, alcohol, and chocolate. This information is not intended to replace advice given to you by your health care provider. Make sure you discuss any questions you have with your health care provider. Document Revised: 04/20/2018 Document Reviewed: 02/03/2016 Elsevier Patient Education  [place].

## 2019-10-29 NOTE — Progress Notes (Signed)
@Patient  ID: Wendy [name], female    DOB: [birth year], 64 y.o.   MRN: [id number]  Chief Complaint  Patient presents with   Follow-up    Patient still has dry cough     Referring provider: [name]  HPI:  64 year old female former smoker followed in our office for upper airway cough syndrome  PMH: Hyperlipidemia, hypertension, history of seizures, obesity, suspected GERD, epilepsy, type 2 diabetes Smoker/ Smoking History: Former smoker Maintenance: None Pt of: Dr. Melvyn Novas  10/29/2019  - Visit   64 year old female former smoker found office for cough variant asthma.  She is followed by Dr. Melvyn Novas.  She was last seen in our office in August/2021.  At that time was recommended that she take prednisone, start promethazine cough syrup, start Protonix, and start Pepcid, start Singulair.  Patient presenting to office today for a follow-up.  She is still reporting a cough.  She reports that her cough was improving when she was taking the Protonix, Pepcid and Singulair.  Unfortunately she was confused on how these were prescribed and she only took them for 30 days.  She was unaware that she had refills at the pharmacy.  She was also unaware that she was was to be maintained on this is a maintenance medication.  She also completed the prednisone.  She did feel that she had improvement with these medications.  As soon as she stopped taking them her cough started to worsen.   10/29/19 - Cough ROS:   When to the symptoms start: 2016 How are you today: last few weeks its worsened   Have you had fever/sore throat (first 5 to 7 days of URI) or Have you had cough/nasal congestion (10 to 14 days of URI) :none  Have you used anything to treat the cough, as anything improved : some improvement with ppi, h2 blocker, singulair Is it a dry or wet cough: dry cough  Does the cough happen when your breathing or when you breathe out: not sure  Other any triggers to your cough, or any aggravating  factors: no idea   Daily antihistamine: GERD treatment: none right  Singulair: not taking regularly   Cough checklist (bolded indicates presence):  Adherence, acid reflux, ACE inhibitor, active sinus disease, active smoking, adverse effects of medications (amiodarone/Macrodantin/bb), alpha 1, allergies, aspiration, anxiety, bronchiectasis, congestive heart failure (diastolic)   Tests:   4/66/5993-TTSVX x-ray-no active cardiopulmonary disease  10/25/2016-pulmonary function test-Methacholine inhalation challenge test, minimal obstructive airways disease, positive hyperactive airways  FENO:  No results found for: NITRICOXIDE  PFT: PFT Results Latest Ref Rng & Units 10/25/2016  FVC-Pre L 2.16  FVC-Predicted Pre % 83  FVC-Post L 2.05  FVC-Predicted Post % 79  Pre FEV1/FVC % % 77  Post FEV1/FCV % % 74  FEV1-Pre L 1.66  FEV1-Predicted Pre % 82  FEV1-Post L 1.52    WALK:  No flowsheet data found.  Imaging: No results found.  Lab Results:  CBC    Component Value Date/Time   WBC 7.8 08/01/2019 1039   RBC 5.58 (H) 08/01/2019 1039   HGB 15.1 (H) 08/01/2019 1039   HCT 44.0 08/01/2019 1039   PLT 228.0 08/01/2019 1039   MCV 78.8 08/01/2019 1039   MCH 27.2 09/20/2018 1541   MCHC 34.3 08/01/2019 1039   RDW 13.6 08/01/2019 1039   LYMPHSABS 2.1 07/23/2016 1555   MONOABS 0.6 07/23/2016 1555   EOSABS 0.1 07/23/2016 1555   BASOSABS 0.0 07/23/2016 1555    BMET  Component Value Date/Time   NA 137 08/01/2019 1039   NA 141 09/09/2015 0000   K 4.1 08/01/2019 1039   CL 101 08/01/2019 1039   CO2 28 08/01/2019 1039   GLUCOSE 162 (H) 08/01/2019 1039   BUN 14 08/01/2019 1039   CREATININE 0.96 08/01/2019 1039   CALCIUM 9.5 08/01/2019 1039   GFRNONAA >60 09/20/2018 1541   GFRAA >60 09/20/2018 1541    BNP No results found for: BNP  ProBNP No results found for: PROBNP  Specialty Problems      Pulmonary Problems   Upper airway cough syndrome    Cough since 2016 made  worse by acei use d/c'd in 07/2015  Allergy profile 07/23/2016 >  Eos 0.1 /  IgE  15 Rast neg - Sinus CT 08/02/2016 >>> 1. Clear paranasal sinuses. - cyclical cough protocol 07/23/2016 and 08/17/2016 > non adherent - Spirometry 10/15/2016  Wnl/ very min curvature  - repeat cyclical cough protocol 10/15/2016 with Prednisone 10 mg take  4 each am x 2 days,   2 each am x 2 days,  1 each am x 2 days and stop and schedule MCT on gerd rx x 2 weeks then refer to [place] if still coughing - MCT 10/25/16  >  POS for reversible airlfow obstruction but no assoc symptoms> started singulair / continued 1st gen H1 blockers per guidelines  - 04/18/17 rx pred/ restarted singulair and 1st gen H1 blockers per guidelines  And took tramadol from prior ov and reports 100% elimination of cough  - 02/2019 relapse cough off all rx for over a year s flare       Cough variant asthma with compoent UACS    Cough since 2016 made worse by acei use d/c'd in 07/2015  Allergy profile 07/23/2016 >  Eos 0.1 /  IgE  15 Rast neg - Sinus CT 08/02/2016 > Clear paranasal sinuses. - cyclical cough protocol 07/23/2016 and 08/17/2016 > non adherent - Spirometry 10/15/2016  Wnl/ very min curvature  - repeat cyclical cough protocol 10/15/2016 with Prednisone 10 mg take  4 each am x 2 days,   2 each am x 2 days,  1 each am x 2 days and stop and schedule MCT on gerd rx x 2 weeks then refer to [place] if still coughing - MCT 10/25/16  >  POS for reversible airlfow obstruction s symptoms> started singulair / continued 1st gen H1 blockers per guidelines   - 04/18/17 rx pred/ restarted singulair and 1st gen H1 blockers per guidelines  And took tramadol from prior ov and reprots 100% elimination of cough  - 02/2019 relapse cough off all rx for over a year s flare > restarted singulair/ gerd rx 05/09/19 plus 6 d prednisone - 05/24/19 cough improved but ? Did not continue meds ?    07/31/2019 - Cough returned, worse off H2 blocker/singulair. Adding flovent 36mcg 2 bid. > no  better 08/31/2019 so d/c'd  - 08/31/2019 restarted max gerd / singulair, 1st gen H1 blockers per guidelines  And pred x 6 d plus phenergan dm prn and d/c'd all inhalers > return in 2 weeks with all meds in hand       Aspiration pneumonia of both lower lobes (HCC)      Allergies  Allergen Reactions   Hydrocodone-Ibuprofen     Unknown to pt   Lisinopril Cough   Vicodin [Hydrocodone-Acetaminophen] Palpitations    Pt states she can take tylenol    Immunization History  Administered  Date(s) Administered   Fluad Quad(high Dose 65+) 09/14/2018   Influenza Split 08/17/2016   Influenza,inj,Quad PF,6+ Mos 07/10/1917, 09/27/2017   Influenza-Unspecified 09/19/2018   PFIZER SARS-COV-2 Vaccination 01/30/2019, 02/21/2019   Pneumococcal Polysaccharide-23 05/20/2016    Past Medical History:  Diagnosis Date   Blood transfusion without reported diagnosis    Chicken pox    Cough    Depression    Helicobacter pylori gastritis    treated 05/2017   History of loop recorder    3 years ago- in [place]. at [facility]   Hyperlipidemia    Hypertension    Left-sided weakness    Stroke [facility]) 03/2015 and 04/2015   Pt had 2 strokes/weakness on the left side   Type 2 diabetes mellitus ([place])    Patient states she is a boader line diabetic    Tobacco History: Social History   Tobacco Use  Smoking Status Former Smoker   Packs/day: 0.25   Years: 10.00   Pack years: 2.50   Types: Cigarettes   Quit date: 01/12/2011   Years since quitting: 8.8  Smokeless Tobacco Never Used   Counseling given: Not Answered   Continue to not smoke  Outpatient Encounter Medications as of 10/29/2019  Medication Sig   amLODipine (NORVASC) 10 MG tablet Take 1 tablet (10 mg total) by mouth daily.   ASPIRIN LOW DOSE 81 MG EC tablet TAKE 1 TABLET BY MOUTH DAILY   empagliflozin (JARDIANCE) 25 MG TABS tablet Take 1 tablet (25 mg total) by mouth daily before breakfast.   famotidine  (PEPCID) 20 MG tablet One an hour before bedtime   levETIRAcetam (KEPPRA) 1000 MG tablet TAKE 1 TABLET BY MOUTH TWICE DAILY *PATIENT NEEDS APPOINTMENT FOR REFILLS*   metFORMIN (GLUCOPHAGE) 1000 MG tablet TAKE 1 TABLET BY MOUTH TWICE DAILY WITH MEALS   montelukast (SINGULAIR) 10 MG tablet Take 1 tablet (10 mg total) by mouth at bedtime.   pantoprazole (PROTONIX) 40 MG tablet Take 1 tablet (40 mg total) by mouth daily. Take 30-60 min before first meal of the day   rosuvastatin (CRESTOR) 20 MG tablet Take 1 tablet (20 mg total) by mouth daily.   [DISCONTINUED] famotidine (PEPCID) 20 MG tablet One an hour before bedtime   [DISCONTINUED] montelukast (SINGULAIR) 10 MG tablet Take 1 tablet (10 mg total) by mouth at bedtime.   [DISCONTINUED] pantoprazole (PROTONIX) 40 MG tablet Take 1 tablet (40 mg total) by mouth daily. Take 30-60 min before first meal of the day   benzonatate (TESSALON) 200 MG capsule Take 1 capsule (200 mg total) by mouth 3 (three) times daily as needed for cough.   Facility-Administered Encounter Medications as of 10/29/2019  Medication   lidocaine-EPINEPHrine (XYLOCAINE W/EPI) 1 %-1:100000 (with pres) injection 10 mL     Review of Systems  Review of Systems  Constitutional: Negative for activity change, fatigue and fever.  HENT: Positive for voice change. Negative for sinus pressure, sinus pain and sore throat.   Respiratory: Positive for cough. Negative for shortness of breath and wheezing.   Cardiovascular: Negative for chest pain and palpitations.  Gastrointestinal: Negative for diarrhea, nausea and vomiting.  Musculoskeletal: Negative for arthralgias.  Neurological: Negative for dizziness.  Psychiatric/Behavioral: Negative for sleep disturbance. The patient is not nervous/anxious.      Physical Exam  BP 128/68 (BP Location: Right Arm, Patient Position: Sitting, Cuff Size: Normal)    Pulse 100    Temp 97.6 F (36.4 C) (Temporal)    Ht 5\' 4"  (1.626 m)  Wt  173 lb 9.6 oz (78.7 kg)    SpO2 97%    BMI 29.80 kg/m   Wt Readings from Last 5 Encounters:  10/29/19 173 lb 9.6 oz (78.7 kg)  10/11/19 173 lb (78.5 kg)  08/31/19 178 lb 6.4 oz (80.9 kg)  07/31/19 173 lb 6.4 oz (78.7 kg)  07/31/19 173 lb 3.2 oz (78.6 kg)    BMI Readings from Last 5 Encounters:  10/29/19 29.80 kg/m  10/11/19 29.70 kg/m  08/31/19 30.62 kg/m  08/28/19 29.76 kg/m  07/31/19 29.76 kg/m     Physical Exam Vitals and nursing note reviewed.  Constitutional:      General: She is not in acute distress.    Appearance: Normal appearance. She is obese.  HENT:     Head: Normocephalic and atraumatic.     Right Ear: Tympanic membrane, ear canal and external ear normal. There is no impacted cerumen.     Left Ear: Tympanic membrane, ear canal and external ear normal. There is no impacted cerumen.     Nose: Rhinorrhea present. No congestion.     Mouth/Throat:     Mouth: Mucous membranes are moist.     Pharynx: Oropharynx is clear.     Comments: +PND Eyes:     Pupils: Pupils are equal, round, and reactive to light.  Cardiovascular:     Rate and Rhythm: Normal rate and regular rhythm.     Pulses: Normal pulses.     Heart sounds: Normal heart sounds. No murmur heard.   Pulmonary:     Effort: Pulmonary effort is normal. No respiratory distress.     Breath sounds: No decreased air movement. No decreased breath sounds, wheezing or rales.     Comments: Multiple coughing episodes and visit, lots of throat clearing and forceful cough Musculoskeletal:     Cervical back: Normal range of motion.  Skin:    General: Skin is warm and dry.     Capillary Refill: Capillary refill takes less than 2 seconds.  Neurological:     General: No focal deficit present.     Mental Status: She is alert and oriented to person, place, and time. Mental status is at baseline.     Gait: Gait normal.  Psychiatric:        Mood and Affect: Mood normal.        Behavior: Behavior normal.         Thought Content: Thought content normal.        Judgment: Judgment normal.       Assessment & Plan:   Upper airway cough syndrome Plan: Restart Protonix Restart Pepcid Restart Singulair Start daily antihistamine Can start nasal saline rinses Can use Tessalon Perles as well as over-the-counter cough medicine such as Delsym for cough suppression Follow-up in 8 weeks  Cough variant asthma with compoent UACS Plan: Restart Singulair Restart Protonix Restart Pepcid Follow-up in 8 weeks  Non-compliant patient Patient had cough improvement while taking PPI, H2 blocker, leukotriene.  Unfortunately patient only took these for 1 month.  She did not contact our office and did not proceed to the pharmacy to get refills.  She was confused on how these were prescribed.  We reviewed this extensively today.  I have ensured that she has refills for these medications.    Return in about 2 months (around 12/29/2019), or if symptoms worsen or fail to improve, for Follow up with Dr. Melvyn Novas.   Lauraine Rinne, NP 10/29/2019   This appointment required 35  minutes of patient care (this includes precharting, chart review, review of results, face-to-face care, etc.).

## 2019-11-01 ENCOUNTER — Other Ambulatory Visit: Payer: Self-pay

## 2019-11-01 DIAGNOSIS — E7849 Other hyperlipidemia: Secondary | ICD-10-CM

## 2019-11-01 DIAGNOSIS — E0865 Diabetes mellitus due to underlying condition with hyperglycemia: Secondary | ICD-10-CM

## 2019-11-01 DIAGNOSIS — Z8673 Personal history of transient ischemic attack (TIA), and cerebral infarction without residual deficits: Secondary | ICD-10-CM

## 2019-11-01 DIAGNOSIS — R0989 Other specified symptoms and signs involving the circulatory and respiratory systems: Secondary | ICD-10-CM

## 2019-11-01 MED ORDER — EMPAGLIFLOZIN 25 MG PO TABS: 25.0000 mg | ORAL_TABLET | Freq: Every day | ORAL | 2 refills | Status: DC

## 2019-11-01 MED ORDER — ROSUVASTATIN CALCIUM 20 MG PO TABS: 20.0000 mg | ORAL_TABLET | Freq: Every day | ORAL | 1 refills | Status: DC

## 2019-11-02 ENCOUNTER — Ambulatory Visit: Payer: Medicare Other

## 2019-11-06 ENCOUNTER — Telehealth: Payer: Self-pay | Admitting: Pulmonary Disease

## 2019-11-06 DIAGNOSIS — R490 Dysphonia: Secondary | ICD-10-CM

## 2019-11-07 NOTE — Telephone Encounter (Signed)
ATC patient- unable to leave vm due to mailbox being full.   

## 2019-11-08 NOTE — Telephone Encounter (Signed)
ATC pt, there was no answer and I could not leave a message due to her VM being full. Will try back. 

## 2019-11-08 NOTE — Telephone Encounter (Signed)
Dr. Melvyn Novas, please advise. thanks

## 2019-11-08 NOTE — Telephone Encounter (Signed)
Please route to Dr. Melvyn Novas as this is his patient.   Wyn Quaker FNP

## 2019-11-08 NOTE — Telephone Encounter (Signed)
Refer to ENT, nothing else to offer than what NP rec already

## 2019-11-08 NOTE — Telephone Encounter (Signed)
Called and spoke to pt. Pt last seen on 10/18 by Wyn Quaker, NP for cough variant asthma. Pt c/o hoarseness that started on 10/19 and progressively worsened per pt. Pt states she started her new medication (Tessalon, Pepcid, and Protonix) on 10/18. Pt states she has not started Singulair as the pharmacy didn't give it to her. Advised pt to call and get Singulair filled and begin taking it. When speaking to pt she is still audible but has coarse and broken voice. Pt denies any other new medication besides the ones prescribed by pulm. Pt denies any other new s/s.    Aaron Edelman, please advise. Thank you.

## 2019-11-16 NOTE — Telephone Encounter (Signed)
Patient is aware of below message and voiced her understanding.  Referral has been placed to ENT, as patient was agreeable. Noting further needed.

## 2019-11-19 ENCOUNTER — Telehealth: Payer: Self-pay

## 2019-11-19 NOTE — Progress Notes (Addendum)
Patient states she would like to get a Freestyle Libre device to check her Glucose.   [place] Pharmacist Assistant [phone]    Addendum 12/03/19:  FreeStyle Libre CGM DME order form filled out and sent to Dutch Quint, FNP to sign.

## 2019-11-20 ENCOUNTER — Ambulatory Visit: Payer: Medicare Other

## 2019-11-27 ENCOUNTER — Ambulatory Visit: Payer: Medicare Other

## 2019-11-28 DIAGNOSIS — J343 Hypertrophy of nasal turbinates: Secondary | ICD-10-CM | POA: Diagnosis not present

## 2019-11-28 DIAGNOSIS — J45991 Cough variant asthma: Secondary | ICD-10-CM | POA: Diagnosis not present

## 2019-11-28 DIAGNOSIS — J342 Deviated nasal septum: Secondary | ICD-10-CM | POA: Diagnosis not present

## 2019-11-28 DIAGNOSIS — K219 Gastro-esophageal reflux disease without esophagitis: Secondary | ICD-10-CM | POA: Diagnosis not present

## 2019-11-28 DIAGNOSIS — J382 Nodules of vocal cords: Secondary | ICD-10-CM | POA: Diagnosis not present

## 2019-11-29 ENCOUNTER — Ambulatory Visit: Payer: Medicare Other

## 2019-11-29 ENCOUNTER — Other Ambulatory Visit: Payer: Self-pay

## 2019-11-29 DIAGNOSIS — Z23 Encounter for immunization: Secondary | ICD-10-CM

## 2019-11-29 NOTE — Patient Instructions (Signed)
Health Maintenance Due  Topic Date Due  . FOOT EXAM  05/20/2017  . MAMMOGRAM  05/11/2018  . DEXA SCAN  Never done  . PNA vac Low Risk Adult (1 of 2 - PCV13) 08/01/2018  . OPHTHALMOLOGY EXAM  07/31/2019  . INFLUENZA VACCINE  08/12/2019    Depression screen [facility] 2/9 10/11/2019 07/31/2019 07/05/2019  Decreased Interest 0 0 0  Down, Depressed, Hopeless 0 0 0  PHQ - 2 Score 0 0 0  Altered sleeping - 0 0  Tired, decreased energy - 0 0  Change in appetite - 0 0  Feeling bad or failure about yourself  - 0 0  Trouble concentrating - 0 0  Moving slowly or fidgety/restless - 0 0  Suicidal thoughts - 0 0  PHQ-9 Score - 0 0  Some recent data might be hidden

## 2019-11-29 NOTE — Progress Notes (Signed)
I reviewed and agree with the documentation and plan as outlined below.   

## 2019-11-29 NOTE — Progress Notes (Signed)
Per orders of Dr. Ethelene Hal injection of Influenza Vaccine, High dose  given by Genesis Novosad L Mathius Birkeland in right deltoid. Patient tolerated injection well.

## 2019-12-01 ENCOUNTER — Other Ambulatory Visit: Payer: Self-pay | Admitting: Family Medicine

## 2019-12-01 DIAGNOSIS — I10 Essential (primary) hypertension: Secondary | ICD-10-CM

## 2019-12-04 ENCOUNTER — Telehealth: Payer: Self-pay

## 2019-12-04 ENCOUNTER — Telehealth: Payer: Self-pay | Admitting: Pulmonary Disease

## 2019-12-04 DIAGNOSIS — H35033 Hypertensive retinopathy, bilateral: Secondary | ICD-10-CM | POA: Diagnosis not present

## 2019-12-04 DIAGNOSIS — E119 Type 2 diabetes mellitus without complications: Secondary | ICD-10-CM | POA: Diagnosis not present

## 2019-12-04 DIAGNOSIS — H40013 Open angle with borderline findings, low risk, bilateral: Secondary | ICD-10-CM | POA: Diagnosis not present

## 2019-12-04 DIAGNOSIS — H2513 Age-related nuclear cataract, bilateral: Secondary | ICD-10-CM | POA: Diagnosis not present

## 2019-12-04 DIAGNOSIS — J45991 Cough variant asthma: Secondary | ICD-10-CM

## 2019-12-04 MED ORDER — BENZONATATE 200 MG PO CAPS: 200.0000 mg | ORAL_CAPSULE | Freq: Three times a day (TID) | ORAL | 1 refills | Status: AC | PRN

## 2019-12-04 NOTE — Progress Notes (Signed)
Chronic Care Management Pharmacy Assistant   Name: Wendy [name]  MRN: [id number] DOB: March 20, 1955  Reason for Encounter: Medication Review   PCP : [name], MD  Allergies:   Allergies  Allergen Reactions  . Hydrocodone-Ibuprofen     Unknown to pt  . Lisinopril Cough  . Vicodin [Hydrocodone-Acetaminophen] Palpitations    Pt states she can take tylenol    Medications: Outpatient Encounter Medications as of 12/04/2019  Medication Sig  . amLODipine (NORVASC) 10 MG tablet TAKE ONE TABLET BY MOUTH EVERY MORNING  . ASPIRIN LOW DOSE 81 MG EC tablet TAKE 1 TABLET BY MOUTH DAILY  . benzonatate (TESSALON) 200 MG capsule Take 1 capsule (200 mg total) by mouth 3 (three) times daily as needed for cough.  . empagliflozin (JARDIANCE) 25 MG TABS tablet Take 1 tablet (25 mg total) by mouth daily before breakfast.  . famotidine (PEPCID) 20 MG tablet One an hour before bedtime  . levETIRAcetam (KEPPRA) 1000 MG tablet TAKE 1 TABLET BY MOUTH TWICE DAILY *PATIENT NEEDS APPOINTMENT FOR REFILLS*  . metFORMIN (GLUCOPHAGE) 1000 MG tablet TAKE 1 TABLET BY MOUTH TWICE DAILY WITH MEALS  . montelukast (SINGULAIR) 10 MG tablet Take 1 tablet (10 mg total) by mouth at bedtime.  . pantoprazole (PROTONIX) 40 MG tablet Take 1 tablet (40 mg total) by mouth daily. Take 30-60 min before first meal of the day  . rosuvastatin (CRESTOR) 20 MG tablet Take 1 tablet (20 mg total) by mouth daily.   Facility-Administered Encounter Medications as of 12/04/2019  Medication  . lidocaine-EPINEPHrine (XYLOCAINE W/EPI) 1 %-1:100000 (with pres) injection 10 mL    Current Diagnosis: Patient Active Problem List   Diagnosis Date Noted  . Non-compliant patient 07/31/2019  . Aspiration pneumonia of both lower lobes ([place]) 05/07/2019  . Epilepsy ([place]) 05/07/2019  . Aortic atherosclerosis ([place]) 10/05/2018  . Morbid obesity due to excess calories ([place]) 10/05/2018  . Abnormal EKG 09/14/2018  . Elevated LDL cholesterol  level 09/14/2018  . Pre-operative clearance 09/14/2018  . Chronic left sacroiliac pain 08/07/2018  . Stenosis of left carotid artery 07/21/2018  . Diabetes mellitus due to underlying condition, uncontrolled, with hyperglycemia ([place]) 06/23/2018  . Ovarian cyst 08/20/2017  . Cough variant asthma with compoent UACS 11/24/2016  . Obesity (BMI 30-39.9) 07/24/2016  . Upper airway cough syndrome 07/23/2016  . History of loop recorder 07/20/2016  . Left carotid bruit 07/20/2016  . Hyperlipidemia 02/04/2016  . History of CVA (cerebrovascular accident) 02/04/2016  . Spastic hemiparesis of left dominant side ([place]) 02/04/2016  . Seizures ([place]) 02/04/2016  . Essential hypertension 04/21/2015  . Abdominal or pelvic swelling, mass or lump, unspecified site 04/21/2015  . Prediabetes 04/03/2015  . Cerebral artery occlusion with cerebral infarction [facility]) 03/22/2015     Follow-Up:  Pharmacist Review   Reviewed chart for medication changes ahead of medication coordination call.  OVs, Consults, or hospital visits since last care coordination call/Pharmacist visit.  10/29/2019 Pulmonology Wyn Quaker  11/28/2019 Otolaryngology Meghan Lelon Frohlich Skotnicki  Medication changes indicated   10/29/2019 Pulmonology: Restart Protonix Restart Pepcid Restart Singulair Start daily antihistamine Can start nasal saline rinses Can use Tessalon Perles as well as over-the-counter cough medicine such as Delsym for cough suppression   BP Readings from Last 3 Encounters:  10/29/19 128/68  08/31/19 120/68  07/31/19 110/70    Lab Results  Component Value Date   HGBA1C 10.1 (H) 08/01/2019     Patient obtains medications through Adherence Packaging  30 Days  Last adherence delivery included: (medication name and frequency)     Montelukast (singular) 10 mg tablet daily - Bedtime Metformin 1000 mg tablet twice daily- breakfast and bedtime Rosuvastatin 20 mg tablet daily  -Breakfast Jardiance 25 mg -Breakfast  Pantoprazole 40 mg daily -Breakfast  Pepcid 20 mg tablet Daily -Breakfast Amlodipine 10 MG Tablet Daily-Breakfast Levetiracetam 1000 MG Tablet Twice Daily- Breakfast, Bedtime Aspirin 81 MG Daily -Breakfast   Patient declined (meds) last month due to PRN use/additional supply on hand.  Lidocaine (Not Taking)   Patient is due for next adherence delivery on: 12/12/2019. Called patient and reviewed medications and coordinated delivery.  This delivery to include:  Montelukast (singular) 10 mg tablet daily - Bedtime Metformin 1000 mg tablet twice daily- breakfast and bedtime Rosuvastatin 20 mg tablet daily -Breakfast Jardiance 25 mg -Breakfast  Pantoprazole 40 mg daily -Breakfast  Pepcid 20 mg tablet Daily -Breakfast Amlodipine 10 MG Tablet Daily-Breakfast Levetiracetam 1000 MG Tablet Twice Daily- Breakfast, Bedtime Aspirin 81 MG Daily -Breakfast  Benzonatate 200 MG Capsule- 3 times daily PRN  Patient declined the following medications (meds) due to (reason)  Lidocaine (Not Taking)   Patient needs refills for Benzonatate 200 MG Capsule.  Confirmed delivery date of 12/12/2019, advised patient that pharmacy will contact them the morning of delivery.  Informed patient Clinical Pharmacist submitted order for [facility] , waiting on a signature.  [place] Pharmacist Assistant [phone]

## 2019-12-04 NOTE — Telephone Encounter (Signed)
Rx has been sent in. 

## 2019-12-05 ENCOUNTER — Other Ambulatory Visit: Payer: Medicare Other

## 2019-12-17 ENCOUNTER — Ambulatory Visit
Admission: RE | Admit: 2019-12-17 | Discharge: 2019-12-17 | Disposition: A | Discharge status: Home / Self Care | Payer: Medicare Other | Source: Ambulatory Visit | Attending: Family Medicine | Admitting: Family Medicine

## 2019-12-17 ENCOUNTER — Other Ambulatory Visit: Payer: Self-pay

## 2019-12-17 DIAGNOSIS — Z1231 Encounter for screening mammogram for malignant neoplasm of breast: Secondary | ICD-10-CM

## 2019-12-24 ENCOUNTER — Telehealth: Payer: Self-pay | Admitting: Family Medicine

## 2019-12-24 ENCOUNTER — Telehealth: Payer: Self-pay

## 2019-12-24 DIAGNOSIS — E7849 Other hyperlipidemia: Secondary | ICD-10-CM

## 2019-12-24 DIAGNOSIS — R0989 Other specified symptoms and signs involving the circulatory and respiratory systems: Secondary | ICD-10-CM

## 2019-12-24 DIAGNOSIS — Z23 Encounter for immunization: Secondary | ICD-10-CM | POA: Diagnosis not present

## 2019-12-24 DIAGNOSIS — Z8673 Personal history of transient ischemic attack (TIA), and cerebral infarction without residual deficits: Secondary | ICD-10-CM

## 2019-12-24 MED ORDER — ROSUVASTATIN CALCIUM 20 MG PO TABS: 20.0000 mg | ORAL_TABLET | Freq: Every day | ORAL | 1 refills | Status: DC

## 2019-12-24 NOTE — Telephone Encounter (Signed)
[facility] is calling to get a refill on patients Rosuvastatin 20mg . If approved, please send in and they will deliver it to her.

## 2019-12-24 NOTE — Progress Notes (Signed)
Chronic Care Management Pharmacy Assistant   Name: Wendy [name]  MRN: [id number] DOB: 1955-06-11  Reason for Encounter: Medication Review    PCP : [name], MD  Allergies:   Allergies  Allergen Reactions  . Hydrocodone-Ibuprofen     Unknown to pt  . Lisinopril Cough  . Vicodin [Hydrocodone-Acetaminophen] Palpitations    Pt states she can take tylenol    Medications: Outpatient Encounter Medications as of 12/24/2019  Medication Sig  . amLODipine (NORVASC) 10 MG tablet TAKE ONE TABLET BY MOUTH EVERY MORNING  . ASPIRIN LOW DOSE 81 MG EC tablet TAKE 1 TABLET BY MOUTH DAILY  . benzonatate (TESSALON) 200 MG capsule Take 1 capsule (200 mg total) by mouth 3 (three) times daily as needed for cough.  . empagliflozin (JARDIANCE) 25 MG TABS tablet Take 1 tablet (25 mg total) by mouth daily before breakfast.  . famotidine (PEPCID) 20 MG tablet One an hour before bedtime  . levETIRAcetam (KEPPRA) 1000 MG tablet TAKE 1 TABLET BY MOUTH TWICE DAILY *PATIENT NEEDS APPOINTMENT FOR REFILLS*  . metFORMIN (GLUCOPHAGE) 1000 MG tablet TAKE 1 TABLET BY MOUTH TWICE DAILY WITH MEALS  . montelukast (SINGULAIR) 10 MG tablet Take 1 tablet (10 mg total) by mouth at bedtime.  . pantoprazole (PROTONIX) 40 MG tablet Take 1 tablet (40 mg total) by mouth daily. Take 30-60 min before first meal of the day  . rosuvastatin (CRESTOR) 20 MG tablet Take 1 tablet (20 mg total) by mouth daily.   Facility-Administered Encounter Medications as of 12/24/2019  Medication  . lidocaine-EPINEPHrine (XYLOCAINE W/EPI) 1 %-1:100000 (with pres) injection 10 mL    Current Diagnosis: Patient Active Problem List   Diagnosis Date Noted  . Non-compliant patient 07/31/2019  . Aspiration pneumonia of both lower lobes ([place]) 05/07/2019  . Epilepsy ([place]) 05/07/2019  . Aortic atherosclerosis ([place]) 10/05/2018  . Morbid obesity due to excess calories ([place]) 10/05/2018  . Abnormal EKG 09/14/2018  . Elevated LDL  cholesterol level 09/14/2018  . Pre-operative clearance 09/14/2018  . Chronic left sacroiliac pain 08/07/2018  . Stenosis of left carotid artery 07/21/2018  . Diabetes mellitus due to underlying condition, uncontrolled, with hyperglycemia ([place]) 06/23/2018  . Ovarian cyst 08/20/2017  . Cough variant asthma with compoent UACS 11/24/2016  . Obesity (BMI 30-39.9) 07/24/2016  . Upper airway cough syndrome 07/23/2016  . History of loop recorder 07/20/2016  . Left carotid bruit 07/20/2016  . Hyperlipidemia 02/04/2016  . History of CVA (cerebrovascular accident) 02/04/2016  . Spastic hemiparesis of left dominant side ([place]) 02/04/2016  . Seizures ([place]) 02/04/2016  . Essential hypertension 04/21/2015  . Abdominal or pelvic swelling, mass or lump, unspecified site 04/21/2015  . Prediabetes 04/03/2015  . Cerebral artery occlusion with cerebral infarction ([place]) 03/22/2015    Goals Addressed   None     Follow-Up:  Pharmacist Review   Reviewed chart for medication changes ahead of medication coordination call.  No OVs, Consults, or hospital visits since last care coordination call/Pharmacist visit.  No medication changes indicated.  BP Readings from Last 3 Encounters:  10/29/19 128/68  08/31/19 120/68  07/31/19 110/70    Lab Results  Component Value Date   HGBA1C 10.1 (H) 08/01/2019     Patient obtains medications through Adherence Packaging  30 Days   Last adherence delivery included:   Montelukast (singular) 10 mg tablet daily - Bedtime Metformin 1000 mg tablet twice daily- breakfast and bedtime Rosuvastatin 20 mg tablet daily -Breakfast Jardiance 25 mg -Breakfast  Pantoprazole  40 mg daily -Breakfast  Pepcid 20 mg tablet Daily -Breakfast Amlodipine 10 MG Tablet Daily-Breakfast Levetiracetam 1000 MG Tablet Twice Daily- Breakfast, Bedtime Aspirin 81 MG Daily -Breakfast              Benzonatate 200 MG Capsule- 3 times daily PR Patient declined (meds) last month due to PRN use/additional supply on hand.    Lidocaine (Not Taking) Patient is due for next adherence delivery on: 01/10/2020 . Called patient and reviewed medications and coordinated delivery.  This delivery to include:  Montelukast (singular) 10 mg tablet daily - Bedtime Metformin 1000 mg tablet twice daily- breakfast and bedtime Rosuvastatin 20 mg tablet daily -Breakfast Jardiance 25 mg -Breakfast  Pantoprazole 40 mg daily -Breakfast  Pepcid 20 mg tablet Daily -Breakfast Amlodipine 10 MG Tablet Daily-Breakfast Levetiracetam 1000 MG Tablet Twice Daily- Breakfast, Bedtime Aspirin 81 MG Daily -Breakfast             Benzonatate 200 MG Capsule- 3 times daily PR  Patient declined the following medications (meds) due to (reason)     Lidocaine (Not Taking)  Patient needs refills for Rosuvastatin 20 mg tablet. Reached out to PCP for refill on 12/24/2019.Marland Kitchen  Confirmed delivery date of 01/10/2020, advised patient that pharmacy will contact them the morning of delivery.  Patient states she has not heard back about receiving meter to check her blood sugars at home. Patient states she prefers Dexcom.  [place] Pharmacist Assistant [phone]

## 2019-12-31 ENCOUNTER — Ambulatory Visit: Payer: Medicare Other | Admitting: Internal Medicine

## 2020-01-07 ENCOUNTER — Ambulatory Visit: Payer: Self-pay

## 2020-01-07 NOTE — Chronic Care Management (AMB) (Signed)
DME Order for [facility] faxed in. Called and spoke with patient to let her know to expect a call from [facility] on the status of her application. She verbalized her understanding and will let me know if she runs into any additional barriers in getting the [employer] approved.   Garey Ham Clinical Pharmacist [facility]  [phone]

## 2020-01-14 ENCOUNTER — Ambulatory Visit: Payer: Medicare Other | Admitting: Internal Medicine

## 2020-01-17 ENCOUNTER — Telehealth: Payer: Self-pay

## 2020-01-17 NOTE — Progress Notes (Addendum)
Spoke to patient to inform patient per clinical pharmacist: [employer] Libre device order is ready, and all she needs to do is return their call to confirm the order. Contact information is  [phone]. Once she has the device please set her up with an in-person appointment with clinical pharmacist to go over education for the device.  Patient verbalized understanding and will call back to schedule appointment.  Everlean Cherry Clinical Pharmacist Assistant [phone]

## 2020-01-27 ENCOUNTER — Other Ambulatory Visit: Payer: Self-pay | Admitting: Family

## 2020-01-27 ENCOUNTER — Other Ambulatory Visit: Payer: Self-pay | Admitting: Family Medicine

## 2020-01-27 ENCOUNTER — Other Ambulatory Visit: Payer: Self-pay | Admitting: Neurology

## 2020-01-27 DIAGNOSIS — E0865 Diabetes mellitus due to underlying condition with hyperglycemia: Secondary | ICD-10-CM

## 2020-01-27 DIAGNOSIS — G40209 Localization-related (focal) (partial) symptomatic epilepsy and epileptic syndromes with complex partial seizures, not intractable, without status epilepticus: Secondary | ICD-10-CM

## 2020-01-27 DIAGNOSIS — I10 Essential (primary) hypertension: Secondary | ICD-10-CM

## 2020-02-01 ENCOUNTER — Telehealth: Payer: Self-pay

## 2020-02-01 NOTE — Progress Notes (Signed)
Chronic Care Management Pharmacy Assistant   Name: Kili Gracy  MRN: [id number] DOB: 1955-01-13  Reason for Encounter: Medication Review  Patient Questions:  1.  Have you seen any other providers since your last visit? No  2.  Any changes in your medicines or health? No    PCP : [name], MD  Allergies:   Allergies  Allergen Reactions  . Hydrocodone-Ibuprofen     Unknown to pt  . Lisinopril Cough  . Vicodin [Hydrocodone-Acetaminophen] Palpitations    Pt states she can take tylenol    Medications: Outpatient Encounter Medications as of 02/01/2020  Medication Sig  . amLODipine (NORVASC) 10 MG tablet TAKE ONE TABLET BY MOUTH EVERY MORNING  . ASPIRIN LOW DOSE 81 MG EC tablet TAKE 1 TABLET BY MOUTH DAILY  . benzonatate (TESSALON) 200 MG capsule Take 1 capsule (200 mg total) by mouth 3 (three) times daily as needed for cough.  . famotidine (PEPCID) 20 MG tablet One an hour before bedtime  . JARDIANCE 25 MG TABS tablet TAKE ONE TABLET BY MOUTH EVERY MORNING  . levETIRAcetam (KEPPRA) 1000 MG tablet TAKE ONE TABLET BY MOUTH AT BREAKFAST AND AT BEDTIME  . metFORMIN (GLUCOPHAGE) 1000 MG tablet TAKE 1 TABLET BY MOUTH TWICE DAILY WITH MEALS  . montelukast (SINGULAIR) 10 MG tablet Take 1 tablet (10 mg total) by mouth at bedtime.  . pantoprazole (PROTONIX) 40 MG tablet Take 1 tablet (40 mg total) by mouth daily. Take 30-60 min before first meal of the day  . rosuvastatin (CRESTOR) 20 MG tablet Take 1 tablet (20 mg total) by mouth daily.   Facility-Administered Encounter Medications as of 02/01/2020  Medication  . lidocaine-EPINEPHrine (XYLOCAINE W/EPI) 1 %-1:100000 (with pres) injection 10 mL    Current Diagnosis: Patient Active Problem List   Diagnosis Date Noted  . Non-compliant patient 07/31/2019  . Aspiration pneumonia of both lower lobes ([place]) 05/07/2019  . Epilepsy ([place]) 05/07/2019  . Aortic atherosclerosis ([place]) 10/05/2018  . Morbid obesity due to excess  calories ([place]) 10/05/2018  . Abnormal EKG 09/14/2018  . Elevated LDL cholesterol level 09/14/2018  . Pre-operative clearance 09/14/2018  . Chronic left sacroiliac pain 08/07/2018  . Stenosis of left carotid artery 07/21/2018  . Diabetes mellitus due to underlying condition, uncontrolled, with hyperglycemia ([place]) 06/23/2018  . Ovarian cyst 08/20/2017  . Cough variant asthma with compoent UACS 11/24/2016  . Obesity (BMI 30-39.9) 07/24/2016  . Upper airway cough syndrome 07/23/2016  . History of loop recorder 07/20/2016  . Left carotid bruit 07/20/2016  . Hyperlipidemia 02/04/2016  . History of CVA (cerebrovascular accident) 02/04/2016  . Spastic hemiparesis of left dominant side ([place]) 02/04/2016  . Seizures ([place]) 02/04/2016  . Essential hypertension 04/21/2015  . Abdominal or pelvic swelling, mass or lump, unspecified site 04/21/2015  . Prediabetes 04/03/2015  . Cerebral artery occlusion with cerebral infarction ([place]) 03/22/2015    Goals Addressed   None    Reviewed chart for medication changes ahead of medication coordination call.  No OVs, Consults, or hospital visits since last care coordination call/Pharmacist visit.  No medication changes indicated   BP Readings from Last 3 Encounters:  10/29/19 128/68  08/31/19 120/68  07/31/19 110/70    Lab Results  Component Value Date   HGBA1C 10.1 (H) 08/01/2019     Patient obtains medications through Adherence Packaging  30 Days   Last adherence delivery included:    Montelukast (singular) 10 mg tablet daily - Bedtime Metformin 1000 mg tablet twice  daily- breakfast and bedtime Rosuvastatin 20 mg tablet daily -Breakfast Jardiance 25 mg -Breakfast  Pantoprazole 40 mg daily -Breakfast  Pepcid 20 mg tablet Daily -Breakfast Amlodipine 10 MG Tablet Daily-Breakfast Levetiracetam 1000 MG Tablet Twice Daily- Breakfast,  Bedtime Aspirin 81 MG Daily -Breakfast Benzonatate 200 MG Capsule- 3 times daily PRN  Patient declined medication last month   Lidocaine (Not Taking)  Patient is due for next adherence delivery on: 02/08/2020. Called patient and reviewed medications and coordinated delivery.   This delivery to include:  Montelukast (singular) 10 mg tablet daily - Bedtime Metformin 1000 mg tablet twice daily- breakfast and bedtime Rosuvastatin 20 mg tablet daily -Breakfast Jardiance 25 mg -Breakfast  Pantoprazole 40 mg tablet Twice daily -Breakfast ,Bedtime Pepcid 20 mg tablet Twice Daily -Breakfast, Bedtime Amlodipine 10 MG Tablet Daily-Breakfast Levetiracetam 1000 MG Tablet Twice Daily- Breakfast, Bedtime Aspirin 81 MG Daily -Breakfast Benzonatate 200 MG Capsule- 3 times daily PRN (Vials)   Patient will not  need a short fill of medication, prior to adherence delivery.  Patient will not need a acute fill of medication, prior to adherence delivery.  Patient declined the following medications:  Lidocaine (Not Taking)   Patient needs refills for None ID.  Confirmed delivery date of 02/08/2020, advised patient that pharmacy will contact them the morning of delivery.  Follow-Up:  Pharmacist Review   Bessie [place] Pharmacist Assistant [phone]

## 2020-02-04 ENCOUNTER — Other Ambulatory Visit: Payer: Self-pay | Admitting: Internal Medicine

## 2020-02-04 DIAGNOSIS — J45991 Cough variant asthma: Secondary | ICD-10-CM

## 2020-02-04 MED ORDER — PANTOPRAZOLE SODIUM 40 MG PO TBEC: 40.0000 mg | DELAYED_RELEASE_TABLET | Freq: Every day | ORAL | 2 refills | Status: DC

## 2020-02-04 MED ORDER — FAMOTIDINE 20 MG PO TABS: ORAL_TABLET | ORAL | 2 refills | Status: DC

## 2020-02-07 ENCOUNTER — Telehealth: Payer: Medicare Other

## 2020-02-07 ENCOUNTER — Telehealth: Payer: Self-pay

## 2020-02-07 ENCOUNTER — Telehealth: Payer: Self-pay | Admitting: Internal Medicine

## 2020-02-07 ENCOUNTER — Other Ambulatory Visit: Payer: Self-pay | Admitting: Family Medicine

## 2020-02-07 DIAGNOSIS — E0865 Diabetes mellitus due to underlying condition with hyperglycemia: Secondary | ICD-10-CM

## 2020-02-07 DIAGNOSIS — J45991 Cough variant asthma: Secondary | ICD-10-CM

## 2020-02-07 MED ORDER — PANTOPRAZOLE SODIUM 40 MG PO TBEC: 40.0000 mg | DELAYED_RELEASE_TABLET | Freq: Every day | ORAL | 0 refills | Status: DC

## 2020-02-07 NOTE — Telephone Encounter (Signed)
Bessie calling from [facility] pharmacy, for refill of Metformin 1000mg  Last fill 02/02/19  #60/11 Last VV 08/28/19

## 2020-02-07 NOTE — Telephone Encounter (Signed)
Rx refilled x 1 only with note to pharmacy that she needs ov for any additional rf

## 2020-02-08 ENCOUNTER — Telehealth: Payer: Self-pay | Admitting: Internal Medicine

## 2020-02-08 NOTE — Telephone Encounter (Signed)
Refill sent in patient aware

## 2020-02-08 NOTE — Telephone Encounter (Signed)
Pharmacy is requesting a new rx for protonix to read 1 tab BID. Current rx states to take QD. Last OV note just states to "restart protonix".   MW please advise if ok to increase protonix to 40mg  BID per pharmacy request.  Thanks!

## 2020-02-08 NOTE — Telephone Encounter (Signed)
Called pharmacy and they stated that they did not need refills of these medications, and to call patient's care plan manager Bessie to review med dosage.  Bessie's #: [phone]  This is a program through pt's PCP. Called Bessie, advised [place] to call patient to make aware of dosage recommendations.  Per Bessie, pt has had increased issues with GERD, and has increased herself to Protonix 40mg  BID and Pepcid 20mg  BID.   lmtcb for pt.  Pt has not seen MW since 08/2019, sounds like she needs an OV to discuss GERD tx regimen.

## 2020-02-08 NOTE — Telephone Encounter (Signed)
Should be taking  Pantoprazole (protonix) 40 mg   Take  30-60 min before first meal of the day and Pepcid (famotidine)  20 mg one after supper/or bedtime believe she already has this   Give enough until next ov or 3 months, whichever comes first with all meds in hand

## 2020-02-11 ENCOUNTER — Telehealth: Payer: Self-pay

## 2020-02-11 NOTE — Telephone Encounter (Signed)
Pt calling to get a medical explanation to continue to ride the [employer] bus.  Pt explains she need it because it is her primary means of transportation.  I asked if there was a form to be filled out for provider to sign. Pt said that she would call [employer] and get a them to fax the form over to this office.  Fax # given to pt.

## 2020-02-11 NOTE — Progress Notes (Signed)
Spoke with patient to inform her per pulmonary office dosage recommendations of Protonix 40 mg daily and Pepcid 20 mg daily, patient need OV to discuss GERD.  Patient states verbalized understanding.  [place] Pharmacist Assistant [phone]

## 2020-02-15 ENCOUNTER — Telehealth: Payer: Self-pay | Admitting: Family Medicine

## 2020-02-15 ENCOUNTER — Telehealth: Payer: Self-pay

## 2020-02-15 DIAGNOSIS — H40013 Open angle with borderline findings, low risk, bilateral: Secondary | ICD-10-CM | POA: Diagnosis not present

## 2020-02-15 NOTE — Telephone Encounter (Signed)
Pt friend Marlinda Mike dropped off form to be filled out, I put in Dr Avel Peace folder up front

## 2020-02-15 NOTE — Telephone Encounter (Signed)
Pt calling to inform nurse that she did not sign her paper work that was dropped off today.  Pt wanted to know if paper work can be electronically signed after provider finishes filling form out.  Please advise CB#[phone]

## 2020-02-19 NOTE — Telephone Encounter (Signed)
Appointment scheduled for patient to come in for evaluation and have forms filled out

## 2020-02-19 NOTE — Telephone Encounter (Signed)
Called and spoke to pt. Pt was very rude during conversation and would not disclose any information on how often she was taking her medication. Appt scheduled with Dr. Melvyn Novas on 2.28.22. Pt verbalized understanding and denied any further questions or concerns at this time.

## 2020-02-19 NOTE — Telephone Encounter (Signed)
Pt calling to ask f-or an update on the paper work that was dropped off, for [place]. Pt will like a call back.  Please advise.  C-B#[phone]

## 2020-02-20 ENCOUNTER — Other Ambulatory Visit: Payer: Self-pay

## 2020-02-21 ENCOUNTER — Ambulatory Visit: Payer: Medicare HMO | Admitting: Family Medicine

## 2020-02-25 ENCOUNTER — Ambulatory Visit: Payer: Medicare HMO | Admitting: Family Medicine

## 2020-02-27 ENCOUNTER — Other Ambulatory Visit: Payer: Self-pay | Admitting: Internal Medicine

## 2020-02-27 ENCOUNTER — Other Ambulatory Visit: Payer: Self-pay | Admitting: Family Medicine

## 2020-02-27 DIAGNOSIS — R0989 Other specified symptoms and signs involving the circulatory and respiratory systems: Secondary | ICD-10-CM

## 2020-02-27 DIAGNOSIS — J45991 Cough variant asthma: Secondary | ICD-10-CM

## 2020-02-27 DIAGNOSIS — E7849 Other hyperlipidemia: Secondary | ICD-10-CM

## 2020-02-27 DIAGNOSIS — Z8673 Personal history of transient ischemic attack (TIA), and cerebral infarction without residual deficits: Secondary | ICD-10-CM

## 2020-02-27 NOTE — Telephone Encounter (Signed)
Last VV 08/28/19 Last fill 12/24/19  #30/1 Next OV 02/29/20

## 2020-02-29 ENCOUNTER — Ambulatory Visit (INDEPENDENT_AMBULATORY_CARE_PROVIDER_SITE_OTHER): Payer: Medicare HMO | Admitting: Family Medicine

## 2020-02-29 ENCOUNTER — Encounter: Payer: Self-pay | Admitting: Family Medicine

## 2020-02-29 ENCOUNTER — Other Ambulatory Visit: Payer: Self-pay

## 2020-02-29 VITALS — BP 128/68 | HR 84 | Temp 97.0°F | Ht 64.0 in | Wt 176.8 lb

## 2020-02-29 DIAGNOSIS — G8112 Spastic hemiplegia affecting left dominant side: Secondary | ICD-10-CM | POA: Diagnosis not present

## 2020-02-29 DIAGNOSIS — Z029 Encounter for administrative examinations, unspecified: Secondary | ICD-10-CM

## 2020-02-29 DIAGNOSIS — Z8673 Personal history of transient ischemic attack (TIA), and cerebral infarction without residual deficits: Secondary | ICD-10-CM | POA: Diagnosis not present
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
## 2020-03-03 ENCOUNTER — Telehealth: Payer: Self-pay | Admitting: Internal Medicine

## 2020-03-03 ENCOUNTER — Telehealth: Payer: Self-pay

## 2020-03-03 DIAGNOSIS — J45991 Cough variant asthma: Secondary | ICD-10-CM

## 2020-03-03 MED ORDER — PANTOPRAZOLE SODIUM 40 MG PO TBEC: 40.0000 mg | DELAYED_RELEASE_TABLET | Freq: Every day | ORAL | 1 refills | Status: AC

## 2020-03-03 NOTE — Progress Notes (Signed)
Chronic Care Management Pharmacy Assistant   Name: Wendy [name]  MRN: [id number] DOB: December 13, 1955  Reason for Encounter: Medication Review  Patient Questions:  1.  Have you seen any other providers since your last visit? Yes, 02/29/2020 Wendy [name]  2.  Any changes in your medicines or health? No   PCP : Wendy Maw, MD  Allergies:   Allergies  Allergen Reactions  . Hydrocodone-Ibuprofen     Unknown to pt  . Lisinopril Cough  . Vicodin [Hydrocodone-Acetaminophen] Palpitations    Pt states she can take tylenol    Medications: Outpatient Encounter Medications as of 03/03/2020  Medication Sig  . amLODipine (NORVASC) 10 MG tablet TAKE ONE TABLET BY MOUTH EVERY MORNING  . ASPIRIN LOW DOSE 81 MG EC tablet TAKE 1 TABLET BY MOUTH DAILY  . benzonatate (TESSALON) 200 MG capsule Take 1 capsule (200 mg total) by mouth 3 (three) times daily as needed for cough. (Patient not taking: Reported on 02/29/2020)  . famotidine (PEPCID) 20 MG tablet TAKE ONE TABLET BY MOUTH AT BREAKFAST AND AT BEDTIME  . JARDIANCE 25 MG TABS tablet TAKE ONE TABLET BY MOUTH EVERY MORNING  . levETIRAcetam (KEPPRA) 1000 MG tablet TAKE ONE TABLET BY MOUTH AT BREAKFAST AND AT BEDTIME  . metFORMIN (GLUCOPHAGE) 1000 MG tablet TAKE ONE TABLET BY MOUTH EVERY MORNING and TAKE ONE TABLET BY MOUTH EVERYDAY AT BEDTIME  . montelukast (SINGULAIR) 10 MG tablet Take 1 tablet (10 mg total) by mouth at bedtime.  . pantoprazole (PROTONIX) 40 MG tablet Take 1 tablet (40 mg total) by mouth daily. Take 30-60 min before first meal of the day  . rosuvastatin (CRESTOR) 20 MG tablet TAKE ONE TABLET BY MOUTH EVERY MORNING   Facility-Administered Encounter Medications as of 03/03/2020  Medication  . lidocaine-EPINEPHrine (XYLOCAINE W/EPI) 1 %-1:100000 (with pres) injection 10 mL    Current Diagnosis: Patient Active Problem List   Diagnosis Date Noted  . Non-compliant patient 07/31/2019  . Aspiration pneumonia of both  lower lobes ([place]) 05/07/2019  . Epilepsy ([place]) 05/07/2019  . Aortic atherosclerosis ([place]) 10/05/2018  . Morbid obesity due to excess calories ([place]) 10/05/2018  . Abnormal EKG 09/14/2018  . Pre-operative clearance 09/14/2018  . Chronic left sacroiliac pain 08/07/2018  . Stenosis of left carotid artery 07/21/2018  . Diabetes mellitus due to underlying condition, uncontrolled, with hyperglycemia ([place]) 06/23/2018  . Ovarian cyst 08/20/2017  . Cough variant asthma with compoent UACS 11/24/2016  . Obesity (BMI 30-39.9) 07/24/2016  . Upper airway cough syndrome 07/23/2016  . History of loop recorder 07/20/2016  . Left carotid bruit 07/20/2016  . Hyperlipidemia 02/04/2016  . History of CVA (cerebrovascular accident) 02/04/2016  . Spastic hemiparesis of left dominant side ([place]) 02/04/2016  . Seizures ([place]) 02/04/2016  . Essential hypertension 04/21/2015  . Abdominal or pelvic swelling, mass or lump, unspecified site 04/21/2015  . Cerebral artery occlusion with cerebral infarction ([place]) 03/22/2015    Goals Addressed   None    Reviewed chart for medication changes ahead of medication coordination call.  OVs, Consults, or hospital visits since last care coordination call/Pharmacist visit.   Yes, 02/29/2020 Wendy [name] No medication changes indicated  BP Readings from Last 3 Encounters:  02/29/20 128/68  10/29/19 128/68  08/31/19 120/68    Lab Results  Component Value Date   HGBA1C 10.1 (H) 08/01/2019     Patient obtains medications through Adherence Packaging  30 Days   Last adherence delivery included:    Montelukast (  singular) 10 mg tablet daily - Bedtime Metformin 1000 mg tablet twice daily- breakfast and bedtime Rosuvastatin 20 mg tablet daily -Breakfast Jardiance 25 mg -Breakfast  Pantoprazole 40 mg tablet Twice daily -Breakfast ,Bedtime Pepcid 20 mg tablet Twice Daily -Breakfast,  Bedtime Amlodipine 10 MG Tablet Daily-Breakfast Levetiracetam 1000 MG Tablet Twice Daily- Breakfast, Bedtime Aspirin 81 MG Daily -Breakfast Benzonatate 200 MG Capsule- 3 times daily PRN (Vials)    Patient declined medication last month:  Lidocaine (Not Taking)  Patient is due for next adherence delivery on: 03/11/2020. Called patient and reviewed medications and coordinated delivery.  This delivery to include:  Montelukast (singular) 10 mg tablet daily - Bedtime Metformin 1000 mg tablet twice daily- breakfast and bedtime Rosuvastatin 20 mg tablet daily -Breakfast Jardiance 25 mg -Breakfast  Pantoprazole 40 mg tablet Twice daily -Breakfast Pepcid 20 mg tablet Twice Daily - Bedtime Amlodipine 10 MG Tablet Daily-Breakfast Levetiracetam 1000 MG Tablet Twice Daily- Breakfast, Bedtime Aspirin 81 MG Daily -Breakfast  Patient will not need a short fill of medication, prior to adherence delivery.  Patient will not need a acute fill of medication, prior to adherence delivery.  Patient declined the following medications:  Benzonatate 200 MG Capsule- 3 times daily PRN (Vials)   Lidocaine (Not Taking)  Patient needs refills for Pantoprazole 40 mg ,reach out to pulmonary to request refill on 03/03/2020 .  Confirmed delivery date of 03/11/2020, advised patient that pharmacy will contact them the morning of delivery.  Follow-Up:  Pharmacist Review   Bessie [place] Pharmacist Assistant [phone]

## 2020-03-03 NOTE — Telephone Encounter (Signed)
Rx for Protonix 40mg  has been sent to preferred pharmacy.  Patient is aware and voiced her understanding.  Nothing further needed.

## 2020-03-10 ENCOUNTER — Ambulatory Visit: Payer: Medicare HMO | Admitting: Internal Medicine

## 2020-03-25 ENCOUNTER — Ambulatory Visit: Payer: Medicare HMO | Admitting: Internal Medicine

## 2020-03-25 NOTE — Progress Notes (Deleted)
Subjective:     Patient ID: Wendy [name], female   DOB: Oct 12, 1955,    MRN: [id number]     Brief patient profile:  33 yobf quit smoking 2013 with no resp problems prior then 2016 sudden onset of coughing fit one day  at work in customer service in [place] and coughed "every day since"   Says sometime later placed on lisinopril and stopped July 2017 because the cough on ACEI was much worse and never improved off it  so referred to pulmonary clinic 07/23/2016 by Dr Edilia Bo.     History of Present Illness  07/23/2016 1st [facility] office visit/ Wendy [name]   Chief Complaint  Patient presents with  . Pulmonary Consult    Referred by Dr. Silvestre Mesi.  Pt c/o cough for the past year- prod with white sputum.  She states that her cough is "all the time" with no specific trigger.   coughs so hard Pos urinary incont and vomiting Maybe a couple of tbsp total per day mucoid sputum, no better with tessalon / zyrtec, atrovent nasal spray  Has not tried any inhalers  cough present 24/7 and interferes with sleep  rec Stop zyrtec and tessalon and atovent  First take delsym two tsp every 12 hours and supplement if needed with  tramadol 50 mg up to 2 every 4 hours to suppress the urge to cough at all or even clear your throat.  Prednisone 10 mg take  4 each am x 2 days,   2 each am x 2 days,  1 each am x 2 days and stop (this is to eliminate allergies and inflammation from coughing) Protonix (pantoprazole) Take 30-60 min before first meal of the day and Pepcid 20 mg one bedtime plus chlorpheniramine 4 mg x 2 at bedtime (both available over the counter)  until cough is completely gone for at least a week without the need for cough suppression GERD diet Allergy profile 07/23/2016 >  Eos 0.1 /  IgE  15 Rast neg - Sinus CT 08/02/2016 >>> 1. Clear paranasal sinus     08/17/2016  f/u ov/Wendy [name] re: refractory cough on ppi qam and zantac bid Chief Complaint  Patient presents with  . Follow-up    Cough not  improving much. She is producing some clear sputum. She states she had such a violent cough a few nights ago, she vomitted. She has occ wheezing.   not compliant with recs, never got the chlorpheniramine and rarely used the tramadol in high enough dose/ freq to eliminate the cough but did admit doing some better at hs while on rx as intended but not has some sense of noct "wheeze" as well Very unsure of meds/ names esp between generic and trade names easily confused  rec For drainage / throat tickle try take CHLORPHENIRAMINE  4 mg - take up to one every 4 hours as needed - available over the counter-  May make you sleepy so only take it when you can afford to be sleepy Take delsym two tsp every 12 hours and supplement if needed with  tramadol 50 mg up to 2 every 4 hours to suppress the urge to cough. .   Continue pantoprazole Take 30-60 min before first meal of the day and Pepcid 20 mg at bedtime  GERD diet   Call if not improved in a week or two for referral to a throat specialist at [facility] (Dr Joya Gaskins)      10/15/2016  f/u ov/Wendy [name] re: cough  Chief Complaint  Patient presents with  . Acute Visit    Cough is not improving since the last visit. She is coughing until she is vomiting sometimes. Cough is non prod.   never took 2 x h1 at hs  Did improve while on tramadol but did not take as prescribed and got 75% improved until ran out, not clear how much she actually used, did not bring bottles back as req  Did not call for Dr Joya Gaskins eval  Cough to point of vomiting 25/7 now  rec  First take delsym two tsp every 12 hours and supplement if needed with  tramadol 50 mg up to 2 every 4 hours  Once you have eliminated the cough for 3 straight days try reducing the tramadol first,  then the delsym as tolerated.   Prednisone 10 mg take  4 each am x 2 days,   2 each am x 2 days,  1 each am x 2 days and stop (this is to eliminate allergies and inflammation from coughing) Protonix (pantoprazole) Take 30-60  min before first meal of the day and Pepcid 20 mg one bedtime plus chlorpheniramine 4 mg x 2 at bedtime (both available over the counter)  until cough is completely gone for at least a week without the need for cough suppression GERD  Please see patient coordinator before you leave today  to schedule Methacholine challenge test in 2 weeks, not sooner, and if this is negative we will be referring you to Dr [facility]ins at [facility]    - MCT 10/25/16  >  POS for reversible airlfow obstruction but denied any assoc symptoms   11/22/2016  f/u ov/Wendy [name] re: cough x 2016 / only taking pepcid prn/ stopped ppi  Chief Complaint  Patient presents with  . Follow-up    Coughing less since the last visit. No new co's today.   new med = singulair not convinced it's helped as much the 1st gen  Tends cough to when swallow saliva Overall better than she was  For last 2 years  Not limited by breathing from desired activities   rec GERD diet  Leave off the acid suppression for now  Continue singulair  For now and just take the delsym up to 2 tsp every 12 hours as needed for cough  For drainage / throat tickle try take CHLORPHENIRAMINE  4 mg - take one every 4 hours as needed - available over the counter- may cause drowsiness so start with just a bedtime dose or two and see how you tolerate it before trying in daytime       01/17/2017  f/u ov/Wendy [name] re: 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 Follow-up    cough with clear mucus  Singulair 10 mg one daily > resume this as maintenance daily medication  AS NEEDED  >>> For drainage / throat tickle try take CHLORPHENIRAMINE  4 mg - take one every 4 hours as needed - available over the counter- may cause drowsiness so start with just a bedtime dose or two and see how you tolerate it before trying in daytime   Please schedule a follow up visit in 3 months but call sooner if needed  with all medications  /inhalers/ solutions in hand so we can verify exactly what you are taking. This includes all medications from all doctors and over the counters  - separate the as needed  medications from the ones you use every day    04/18/2017  f/u ov/Wendy [name] re:  Cough variant asthma / did not fill rx for singulair/ pred helps the most / did not bring any meds  Chief Complaint  Patient presents with  . Follow-up    states she still has cough and has not seen much improvement.   Dyspnea:  Not limited by breathing from desired activities  And cough no worse with ex  Cough:  No pattern/ has cough even while sleeping/ non prod Sleep: disrupted by cough  SABA use:  None rec Prednisone 10 mg take  4 each am x 2 days,   2 each am x 2 days,  1 each am x 2 days and stop  Singulair 10 mg one daily > resume this as maintenance daily medication  AS NEEDED  >>> For drainage / throat tickle try take CHLORPHENIRAMINE  4 mg - take one every 4 hours as needed     05/09/2019  Extended f/u ov/Wendy [name] re: recurrent cough since feb 2021  after gone for more than a year s any meds  Chief Complaint  Patient presents with  . Acute Visit    Cough x 2 months- non prod "all day every day"- keeps her up in the night.   Dyspnea:  Not limited by breathing from desired activities   Cough: dry cough / no relation to eating / no change with temps / best in ams/ worse  lunch time / worst immediately at hs  Sleeping: bed is flat and 2 pillows  SABA use: none 02: none  Rec Prednisone 10 mg take  4 each am x 2 days,  2 each am x 2 days,  1 each am x 2 days and stop (this is to eliminate allergies and inflammation from coughing) Protonix (pantoprazole) Take 30-60 min before first meal of the day and Pepcid 20 mg one bedtime plus chlortabs chlorpheniramine 4 mg x 2  One hour before  bedtime (both available over the counter)  until cough is completely gone for at least a week without the need for cough suppression Singulair 10 mg one daily > resume  this as maintenance daily medication  Please schedule a follow up visit in  2 weeks  but call sooner if needed  with all medications   05/24/2019  f/u ov/Wendy [name] re: cough variant asthma  ? With uacs / better back on singulair and gerd rx  Chief Complaint  Patient presents with  . Follow-up    pt states cough but states it has improved. pt states finish predinose  Dyspnea:  Not limited by breathing from desired activities / low back limiting   Cough: gone  Sleeping: flat bed 2 pillows  SABA use: none  02: none rec Once the protonix (pantoprazole) runs out don't refill it but change pepcid  (famotidine)  to twice daily  - after breakfast and supper    08/31/2019  f/u ov/Wendy [name] re: cough comes and goes  since 2016 - initially reported cough never improved at all until shown ov notes from 05/24/19 "well if that's what it says - I don't remember it that way"  Chief Complaint  Patient presents with  . Follow-up    unchanged chronic cough   sob:  Only when having coughing fit Cough 24/7 harsh barking quality  "inhalers don't work"  - says took flovent 44 no better rec Prednisone 10 mg take  4 each am x 2 days,  2  each am x 2 days,  1 each am x 2 days and stop  Singulair (montelukast) 10 mg one daily > resume this as maintenance daily medication  For cough > phenergan dm a tsp every 4 hours as needed Protonix (pantoprazole) Take 30-60 min before first meal of the day and Pepcid 20 mg one bedtime plus chlortabs chlorpheniramine 4 mg x 2  One hour before  bedtime (both available over the counter)  until cough is completely gone for at least a week without the need for cough suppression GERD diet  Please schedule a follow up visit in  2 weeks with NP  but call sooner if needed  with all medications /inhalers/ solutions in hand so we can verify exactly what you are taking. This includes all medications from all doctors and over the counters  - separate the as needed medications from the ones you use  every day. Late add:  If not better consider trial of gabapentin 100 tid or referral to Dr Joya Gaskins at [facility]         03/25/2020  f/u ov/Wendy [name] re:  No chief complaint on file.   Dyspnea:  *** Cough: *** Sleeping: *** SABA use: *** 02: *** Covid status:   ***   No obvious day to day or daytime variability or assoc excess/ purulent sputum or mucus plugs or hemoptysis or cp or chest tightness, subjective wheeze or overt sinus or hb symptoms.   *** without nocturnal  or early am exacerbation  of respiratory  c/o's or need for noct saba. Also denies any obvious fluctuation of symptoms with weather or environmental changes or other aggravating or alleviating factors except as outlined above   No unusual exposure hx or h/o childhood pna/ asthma or knowledge of premature birth.  Current Allergies, Complete Past Medical History, Past Surgical History, Family History, and Social History were reviewed in [employer] record.  ROS  The following are not active complaints unless bolded Hoarseness, sore throat, dysphagia, dental problems, itching, sneezing,  nasal congestion or discharge of excess mucus or purulent secretions, ear ache,   fever, chills, sweats, unintended wt loss or wt gain, classically pleuritic or exertional cp,  orthopnea pnd or arm/hand swelling  or leg swelling, presyncope, palpitations, abdominal pain, anorexia, nausea, vomiting, diarrhea  or change in bowel habits or change in bladder habits, change in stools or change in urine, dysuria, hematuria,  rash, arthralgias, visual complaints, headache, numbness, weakness or ataxia or problems with walking or coordination,  change in mood or  memory.        No outpatient medications have been marked as taking for the 03/25/20 encounter (Appointment) with Tanda Rockers, MD.   Current Facility-Administered Medications for the 03/25/20 encounter (Appointment) with Tanda Rockers, MD  Medication  .  lidocaine-EPINEPHrine (XYLOCAINE W/EPI) 1 %-1:100000 (with pres) injection 10 mL               Objective:  Physical Exam      03/25/2020        *** 08/31/2019        178 05/24/2019        177  05/09/2019        177  04/18/2017         170  01/17/2017         179  11/22/2016     179 10/15/2016       180   08/17/2016        174  07/23/16 177 lb (80.3 kg)  07/20/16 176 lb (79.8 kg)  06/28/16 169 lb 9.6 oz (76.9 kg)       Vital signs reviewed  03/25/2020  - Note at rest 02 sats  ***% on ***   General appearance:    ***            Assessment:

## 2020-03-28 ENCOUNTER — Other Ambulatory Visit: Payer: Self-pay | Admitting: Family

## 2020-03-28 DIAGNOSIS — I10 Essential (primary) hypertension: Secondary | ICD-10-CM

## 2020-04-02 ENCOUNTER — Telehealth: Payer: Self-pay

## 2020-04-02 NOTE — Progress Notes (Signed)
    Chronic Care Management Pharmacy Assistant   Name: Wendy [name]  MRN: [id number] DOB: 1955-06-15   Reason for Encounter: Medication Review/Medication Coordination Call     Recent office visits:  None ID  Recent consult visits:  None ID  Hospital visits:  None in previous 6 months  Medications: Outpatient Encounter Medications as of 04/02/2020  Medication Sig  . amLODipine (NORVASC) 10 MG tablet TAKE ONE TABLET BY MOUTH EVERY MORNING  . ASPIRIN LOW DOSE 81 MG EC tablet TAKE 1 TABLET BY MOUTH DAILY  . benzonatate (TESSALON) 200 MG capsule Take 1 capsule (200 mg total) by mouth 3 (three) times daily as needed for cough. (Patient not taking: Reported on 02/29/2020)  . famotidine (PEPCID) 20 MG tablet TAKE ONE TABLET BY MOUTH AT BREAKFAST AND AT BEDTIME  . JARDIANCE 25 MG TABS tablet TAKE ONE TABLET BY MOUTH EVERY MORNING  . levETIRAcetam (KEPPRA) 1000 MG tablet TAKE ONE TABLET BY MOUTH AT BREAKFAST AND AT BEDTIME  . metFORMIN (GLUCOPHAGE) 1000 MG tablet TAKE ONE TABLET BY MOUTH EVERY MORNING and TAKE ONE TABLET BY MOUTH EVERYDAY AT BEDTIME  . montelukast (SINGULAIR) 10 MG tablet Take 1 tablet (10 mg total) by mouth at bedtime.  . pantoprazole (PROTONIX) 40 MG tablet Take 1 tablet (40 mg total) by mouth daily. Take 30-60 min before first meal of the day  . rosuvastatin (CRESTOR) 20 MG tablet TAKE ONE TABLET BY MOUTH EVERY MORNING   Facility-Administered Encounter Medications as of 04/02/2020  Medication  . lidocaine-EPINEPHrine (XYLOCAINE W/EPI) 1 %-1:100000 (with pres) injection 10 mL    Star Rating Drugs: Jardianace 25 Mg Metformin 1000 Mg Rosuvastatin 20 Mg   Reviewed chart for medication changes ahead of medication coordination call.  No medication changes indicated OR if recent visit, treatment plan here.  BP Readings from Last 3 Encounters:  02/29/20 128/68  10/29/19 128/68  08/31/19 120/68    Lab Results  Component Value Date   HGBA1C 10.1 (H) 08/01/2019      Patient obtains medications through Adherence Packaging  30 Days   Last adherence delivery included:    Montelukast (singular) 10 mg tablet daily - Bedtime Metformin 1000 mg tablet twice daily- breakfast and bedtime Rosuvastatin 20 mg tablet daily -Breakfast Jardiance 25 mg -Breakfast  Pantoprazole 40 mgtablet Twicedaily -Breakfast Pepcid 20 mg tabletTwiceDaily - Bedtime Amlodipine 10 MG Tablet Daily-Breakfast Levetiracetam 1000 MG Tablet Twice Daily- Breakfast, Bedtime Aspirin 81 MG Daily -Breakfast   Patient declined medication last month             Benzonatate 200 MG Capsule- 3 times daily PRN (Vials)             Lidocaine (Not Taking)  Patient is due for next adherence delivery on: 04/10/2020 Called patient and reviewed medications and coordinated delivery.  Patient switch pharmacy for her convenience.Notifed Clinical Pharmacist.   Wendy [name] Pharmacist Assistant [phone]

## 2020-05-05 ENCOUNTER — Telehealth: Payer: Self-pay

## 2020-05-05 ENCOUNTER — Telehealth: Payer: Medicare HMO

## 2020-05-05 NOTE — Progress Notes (Signed)
Left message for patient to confirmed patient telephone appointment on 05/06/2020 for CCM at 10:00 am with Junius Argyle the Clinical pharmacist.    [place] Pharmacist Assistant [phone]

## 2020-05-06 ENCOUNTER — Telehealth: Payer: Self-pay

## 2020-05-06 NOTE — Progress Notes (Deleted)
Chronic Care Management Pharmacy Note  05/06/2020 Name:  Wendy [name] MRN:  [id number] DOB:  September 27, 1955  Subjective: Wendy [name] is an 65 y.o. year old female who is a primary patient of [name], MD.  The CCM team was consulted for assistance with disease management and care coordination needs.    Engaged with patient by telephone for follow up visit in response to provider referral for pharmacy case management and/or care coordination services.   Consent to Services:  The patient was given information about Chronic Care Management services, agreed to services, and gave verbal consent prior to initiation of services.  Please see initial visit note for detailed documentation.   Patient Care Team: [name], MD as PCP - General (Family Medicine) Cameron Sprang, MD as Consulting Physician (Neurology) [name], [facility] as Pharmacist (Pharmacist)  Recent office visits: 02/29/20: Patient presented to Dr. Gena Fray for transportation paperwork.   10/11/19: Patient presented to Caroleen Hamman, LPN for AWV.  07/19/60: Patient presented to Dr. Ethelene Hal for follow-up.   Recent consult visits: ***  Hospital visits: {Hospital DC Yes/No:25215}  Objective:  Lab Results  Component Value Date   CREATININE 0.96 08/01/2019   BUN 14 08/01/2019   GFR 70.36 08/01/2019   GFRNONAA >60 09/20/2018   GFRAA >60 09/20/2018   NA 137 08/01/2019   K 4.1 08/01/2019   CALCIUM 9.5 08/01/2019   CO2 28 08/01/2019   GLUCOSE 162 (H) 08/01/2019    Lab Results  Component Value Date/Time   HGBA1C 10.1 (H) 08/01/2019 10:39 AM   HGBA1C 8.7 (H) 09/14/2018 01:57 PM   GFR 70.36 08/01/2019 10:39 AM   GFR 89.65 09/14/2018 01:57 PM   MICROALBUR <0.7 08/01/2019 10:39 AM   MICROALBUR 2.0 (H) 06/22/2018 10:05 AM    Last diabetic Eye exam: No results found for: HMDIABEYEEXA  Last diabetic Foot exam: No results found for: HMDIABFOOTEX   Lab Results  Component Value Date   CHOL 166  08/01/2019   HDL 49.20 08/01/2019   LDLCALC 83 08/01/2019   LDLDIRECT 86.0 08/01/2019   TRIG 166.0 (H) 08/01/2019   CHOLHDL 3 08/01/2019    Hepatic Function Latest Ref Rng & Units 08/01/2019 09/20/2018 09/14/2018  Total Protein 6.0 - 8.3 g/dL 7.6 7.7 7.0  Albumin 3.5 - 5.2 g/dL 4.6 4.4 4.2  AST 0 - 37 U/L _0 ALT 0 - 35 U/L _1 Alk Phosphatase 39 - 117 U/L 118(H) 131(H) 124(H)  Total Bilirubin 0.2 - 1.2 mg/dL 0.6 0.5 0.4  Bilirubin, Direct 0.0 - 0.3 mg/dL - - -    Lab Results  Component Value Date/Time   TSH 1.52 06/22/2018 10:05 AM   TSH 0.71 02/04/2016 10:26 AM    CBC Latest Ref Rng & Units 08/01/2019 09/20/2018 09/14/2018  WBC 4.0 - 10.5 K/uL 7.8 8.0 9.6  Hemoglobin 12.0 - 15.0 g/dL 15.1(H) 14.8 13.7  Hematocrit 36.0 - 46.0 % 44.0 44.7 41.3  Platelets 150.0 - 400.0 K/uL 228.0 242 247.0    No results found for: VD25OH  Clinical ASCVD: Yes  The ASCVD Risk score Mikey Bussing DC Jr., et al., 2013) failed to calculate for the following reasons:   The patient has a prior MI or stroke diagnosis    Depression screen [facility] 2/9 02/29/2020 10/11/2019 07/31/2019  Decreased Interest 0 0 0  Down, Depressed, Hopeless 0 0 0  PHQ - 2 Score 0 0 0  Altered sleeping - - 0  Tired, decreased energy - -  0  Change in appetite - - 0  Feeling bad or failure about yourself  - - 0  Trouble concentrating - - 0  Moving slowly or fidgety/restless - - 0  Suicidal thoughts - - 0  PHQ-9 Score - - 0  Some recent data might be hidden    Social History   Tobacco Use  Smoking Status Former Smoker  . Packs/day: 0.25  . Years: 10.00  . Pack years: 2.50  . Types: Cigarettes  . Quit date: 01/12/2011  . Years since quitting: 9.3  Smokeless Tobacco Never Used   BP Readings from Last 3 Encounters:  02/29/20 128/68  10/29/19 128/68  08/31/19 120/68   Pulse Readings from Last 3 Encounters:  02/29/20 84  10/29/19 100  08/31/19 74   Wt Readings from Last 3 Encounters:  02/29/20 176 lb 12.8 oz (80.2  kg)  10/29/19 173 lb 9.6 oz (78.7 kg)  10/11/19 173 lb (78.5 kg)   BMI Readings from Last 3 Encounters:  02/29/20 30.35 kg/m  10/29/19 29.80 kg/m  10/11/19 29.70 kg/m    Assessment/Interventions: Review of patient past medical history, allergies, medications, health status, including review of consultants reports, laboratory and other test data, was performed as part of comprehensive evaluation and provision of chronic care management services.   SDOH:  (Social Determinants of Health) assessments and interventions performed: Yes  SDOH Screenings   Alcohol Screen: Not on file  Depression (PHQ2-9): Low Risk   . PHQ-2 Score: 0  Financial Resource Strain: High Risk  . Difficulty of Paying Living Expenses: Hard  Food Insecurity: Not on file  Housing: Not on file  Physical Activity: Not on file  Social Connections: Not on file  Stress: Not on file  Tobacco Use: Medium Risk  . Smoking Tobacco Use: Former Smoker  . Smokeless Tobacco Use: Never Used  Transportation Needs: No Transportation Needs  . Lack of Transportation (Medical): No  . Lack of Transportation (Non-Medical): No    CCM Care Plan  Allergies  Allergen Reactions  . Hydrocodone-Ibuprofen     Unknown to pt  . Lisinopril Cough  . Vicodin [Hydrocodone-Acetaminophen] Palpitations    Pt states she can take tylenol    Medications Reviewed Today    Reviewed by [name], [facility] (Pharmacist) on 03/04/20 at [place] List Status: <None>  Medication Order Taking? Sig Documenting Provider Last Dose Status Informant  amLODipine (NORVASC) 10 MG tablet [id number] Yes TAKE ONE TABLET BY MOUTH EVERY MORNING Kennyth Arnold, FNP Taking Active   ASPIRIN LOW DOSE 81 MG EC tablet [id number] Yes TAKE 1 TABLET BY MOUTH DAILY [name], MD Taking Active   benzonatate (TESSALON) 200 MG capsule [id number]  Take 1 capsule (200 mg total) by mouth 3 (three) times daily as needed for cough.  Patient not taking: Reported  on 02/29/2020   Lauraine Rinne, NP  Active   famotidine (PEPCID) 20 MG tablet [id number] Yes TAKE ONE TABLET BY MOUTH AT Elita [place] AND AT BEDTIME Tanda Rockers, MD Taking Active   JARDIANCE 25 MG TABS tablet [id number] Yes TAKE ONE TABLET BY MOUTH EVERY MORNING Dutch Quint B, FNP Taking Active   levETIRAcetam (KEPPRA) 1000 MG tablet [id number] Yes TAKE ONE TABLET BY MOUTH AT BREAKFAST AND AT BEDTIME Cameron Sprang, MD Taking Active   lidocaine-EPINEPHrine (XYLOCAINE W/EPI) 1 %-1:100000 (with pres) injection 10 mL [id number]   Croitoru, Dani Gobble, MD  Active   metFORMIN (GLUCOPHAGE) 1000 MG tablet [id number] Yes TAKE ONE  TABLET BY MOUTH EVERY MORNING and TAKE ONE TABLET BY MOUTH EVERYDAY AT BEDTIME Dutch Quint B, FNP Taking Active   montelukast (SINGULAIR) 10 MG tablet [id number] Yes Take 1 tablet (10 mg total) by mouth at bedtime. Lauraine Rinne, NP Taking Active   pantoprazole (PROTONIX) 40 MG tablet [id number] Yes Take 1 tablet (40 mg total) by mouth daily. Take 30-60 min before first meal of the day Tanda Rockers, MD Taking Active   rosuvastatin (CRESTOR) 20 MG tablet [id number] Yes TAKE ONE TABLET BY MOUTH EVERY MORNING [name], MD Taking Active           Patient Active Problem List   Diagnosis Date Noted  . Non-compliant patient 07/31/2019  . Aspiration pneumonia of both lower lobes ([place]) 05/07/2019  . Epilepsy ([place]) 05/07/2019  . Aortic atherosclerosis ([place]) 10/05/2018  . Morbid obesity due to excess calories ([place]) 10/05/2018  . Abnormal EKG 09/14/2018  . Pre-operative clearance 09/14/2018  . Chronic left sacroiliac pain 08/07/2018  . Stenosis of left carotid artery 07/21/2018  . Diabetes mellitus due to underlying condition, uncontrolled, with hyperglycemia ([place]) 06/23/2018  . Ovarian cyst 08/20/2017  . Cough variant asthma with compoent UACS 11/24/2016  . Obesity (BMI 30-39.9) 07/24/2016  . Upper airway cough syndrome 07/23/2016  . History of loop recorder 07/20/2016   . Left carotid bruit 07/20/2016  . Hyperlipidemia 02/04/2016  . History of CVA (cerebrovascular accident) 02/04/2016  . Spastic hemiparesis of left dominant side ([place]) 02/04/2016  . Seizures ([place]) 02/04/2016  . Essential hypertension 04/21/2015  . Abdominal or pelvic swelling, mass or lump, unspecified site 04/21/2015  . Cerebral artery occlusion with cerebral infarction ([place]) 03/22/2015    Immunization History  Administered Date(s) Administered  . Fluad Quad(high Dose 65+) 09/14/2018  . Influenza Split 08/17/2016  . Influenza Whole 11/07/2019  . Influenza,inj,Quad PF,6+ Mos 07/10/1917, 09/27/2017  . Influenza-Unspecified 09/19/2018  . PFIZER(Purple Top)SARS-COV-2 Vaccination 01/30/2019, 02/21/2019, 10/21/2019  . Pneumococcal Polysaccharide-23 05/20/2016  . Zoster Recombinat (Shingrix) 12/24/2019    Conditions to be addressed/monitored:  Hypertension, Hyperlipidemia, Diabetes, Asthma and Epilepsy  There are no care plans that you recently modified to display for this patient.    Medication Assistance: {MEDASSISTANCEINFO:25044}  Patient's preferred pharmacy is:  [facility] - [place], [place] - [address] [place] [address] Phone: [phone] Fax: [phone]  Uses pill box? {Yes or If no, why not?:20788} Pt endorses ***% compliance  We discussed: {Pharmacy options:24294} Patient decided to: {US Pharmacy Plan:23885}  Care Plan and Follow Up Patient Decision:  {FOLLOWUP:24991}  Plan: {CM FOLLOW UP ULAG:53646}  ***   Current Barriers:  . {pharmacybarriers:24917}  Pharmacist Clinical Goal(s):  Marland Kitchen Patient will {PHARMACYGOALCHOICES:24921} through collaboration with PharmD and provider.   Interventions: . 1:1 collaboration with [name], MD regarding development and update of comprehensive plan of care as evidenced by provider attestation and co-signature . Inter-disciplinary care team  collaboration (see longitudinal plan of care) . Comprehensive medication review performed; medication list updated in electronic medical record  Hypertension (BP goal <130/80) -{US controlled/uncontrolled:25276} -Current treatment: . Amlodipine 10 mg daily  -Medications previously tried: ***  -Current home readings: *** -Current dietary habits: *** -Current exercise habits: *** -{ACTIONS;DENIES/REPORTS:21021675::"Denies"} hypotensive/hypertensive symptoms -Educated on {CCM BP Counseling:25124} -Counseled to monitor BP at home ***, document, and provide log at future appointments -{CCMPHARMDINTERVENTION:25122}  Hyperlipidemia: (LDL goal < 70) -Not ideally controlled -Current treatment: . Rosuvastatin 20 mg daily  -Current treatment: .  Aspirin 81 mg daily  -Medications previously tried: ***  -Current dietary patterns: *** -Current exercise habits: *** -Educated on {CCM HLD Counseling:25126} -{CCMPHARMDINTERVENTION:25122}  Diabetes (A1c goal <8%) -Uncontrolled -Current medications: . Jardiance 25 mg daily  . Metformin 1000 mg twice daily  -Medications previously tried: ***  -Current home glucose readings . fasting glucose: *** . post prandial glucose: *** -{ACTIONS;DENIES/REPORTS:21021675::"Denies"} hypoglycemic/hyperglycemic symptoms -Current meal patterns:  . breakfast: ***  . lunch: ***  . dinner: *** . snacks: *** . drinks: *** -Current exercise: *** -Educated on {CCM DM COUNSELING:25123} -Counseled to check feet daily and get yearly eye exams -{CCMPHARMDINTERVENTION:25122}  Cough Variant Asthma (Goal: control symptoms and prevent exacerbations) -Controlled -Current treatment  . Benzonatate 200 mg three times daily as needed  . Famotidine 20 mg twice daily  . Montelukast 10 mg nightly  . Pantoprazole 40 mg daily  -Medications previously tried: ***  -Gold Grade: {CHL HP [facility] COPD Gold KCMKL:[id number]} -Current COPD Classification:  {CHL HP [facility] COPD Classification:[phone]} -MMRC/CAT score: *** -Pulmonary function testing: *** -Exacerbations requiring treatment in last 6 months: *** -Patient {Actions; denies-reports:120008} consistent use of maintenance inhaler -Frequency of rescue inhaler use: *** -Counseled on {CCMINHALERCOUNSELING:25121} -{CCMPHARMDINTERVENTION:25122}  History of Epilepsy / Seizures (Goal: prevent seizures) -Controlled -Current treatment  . Levetiracetam 1000 mg twice daily  -Medications previously tried: ***  -{CCMPHARMDINTERVENTION:25122}   Patient Goals/Self-Care Activities . Patient will:  - {pharmacypatientgoals:24919}  Follow Up Plan: {CM FOLLOW UP PVXY:80165}

## 2020-05-08 ENCOUNTER — Telehealth: Payer: Self-pay

## 2020-05-08 NOTE — Progress Notes (Signed)
    Chronic Care Management Pharmacy Assistant   Name: Wendy [name]  MRN: [id number] DOB: [birth year]  Reason for Encounter: Medication [facility] visits:  None in previous 6 months  Medications: Outpatient Encounter Medications as of 05/08/2020  Medication Sig  . amLODipine (NORVASC) 10 MG tablet TAKE ONE TABLET BY MOUTH EVERY MORNING  . ASPIRIN LOW DOSE 81 MG EC tablet TAKE 1 TABLET BY MOUTH DAILY  . benzonatate (TESSALON) 200 MG capsule Take 1 capsule (200 mg total) by mouth 3 (three) times daily as needed for cough. (Patient not taking: Reported on 02/29/2020)  . famotidine (PEPCID) 20 MG tablet TAKE ONE TABLET BY MOUTH AT BREAKFAST AND AT BEDTIME  . JARDIANCE 25 MG TABS tablet TAKE ONE TABLET BY MOUTH EVERY MORNING  . levETIRAcetam (KEPPRA) 1000 MG tablet TAKE ONE TABLET BY MOUTH AT BREAKFAST AND AT BEDTIME  . metFORMIN (GLUCOPHAGE) 1000 MG tablet TAKE ONE TABLET BY MOUTH EVERY MORNING and TAKE ONE TABLET BY MOUTH EVERYDAY AT BEDTIME  . montelukast (SINGULAIR) 10 MG tablet Take 1 tablet (10 mg total) by mouth at bedtime.  . pantoprazole (PROTONIX) 40 MG tablet Take 1 tablet (40 mg total) by mouth daily. Take 30-60 min before first meal of the day  . rosuvastatin (CRESTOR) 20 MG tablet TAKE ONE TABLET BY MOUTH EVERY MORNING   Facility-Administered Encounter Medications as of 05/08/2020  Medication  . lidocaine-EPINEPHrine (XYLOCAINE W/EPI) 1 %-1:100000 (with pres) injection 10 mL    Star Rating Drugs: Jardiance 25 mg last filled on 03/08/2020 for 30 day supply at [facility]. Metformin 1000 mg  last filled on 03/08/2020 [facility]. Rosuvastatin 20 mg last filled on 03/08/2020 for 30 day supply at [facility].  Patient currently receives her medications from a different pharmacy.  [place] Pharmacist Assistant [phone]

## 2020-05-15 ENCOUNTER — Telehealth: Payer: Self-pay

## 2020-05-15 NOTE — Progress Notes (Signed)
Patient states she is no longer in [place] and has a different primary care provider.Patient states she would like to unenrolled in Chronic Care Management.Notifed Clinical Pharmacist.  [place] Pharmacist Assistant [phone]

## 2020-10-02 ENCOUNTER — Ambulatory Visit: Payer: Medicare Other | Admitting: Neurology

## 2020-11-28 ENCOUNTER — Telehealth: Payer: Self-pay | Admitting: Family Medicine

## 2020-11-28 NOTE — Telephone Encounter (Signed)
Left message for patient to call back and schedule Medicare Annual Wellness Visit (AWV) in office.   If not able to come in office, please offer to do virtually or by telephone.  Left office number and my jabber [phone].  Last AWV:10/11/2019  Please schedule at anytime with Nurse Health Advisor.

## 2022-05-12 ENCOUNTER — Encounter: Payer: Self-pay | Admitting: Gastroenterology
